# Patient Record
Sex: Female | Born: 1975 | Hispanic: No | Smoking: Current every day smoker
Health system: Southern US, Community
[De-identification: ages and names within clinical notes are randomized; demographics above are authoritative.]

## PROBLEM LIST (undated history)

## (undated) DIAGNOSIS — T8859XA Other complications of anesthesia, initial encounter: Secondary | ICD-10-CM

## (undated) DIAGNOSIS — F32A Depression, unspecified: Secondary | ICD-10-CM

## (undated) DIAGNOSIS — T4145XA Adverse effect of unspecified anesthetic, initial encounter: Secondary | ICD-10-CM

## (undated) DIAGNOSIS — R4586 Emotional lability: Secondary | ICD-10-CM

## (undated) DIAGNOSIS — J329 Chronic sinusitis, unspecified: Secondary | ICD-10-CM

## (undated) DIAGNOSIS — G629 Polyneuropathy, unspecified: Secondary | ICD-10-CM

## (undated) DIAGNOSIS — F419 Anxiety disorder, unspecified: Secondary | ICD-10-CM

## (undated) DIAGNOSIS — R531 Weakness: Secondary | ICD-10-CM

## (undated) DIAGNOSIS — F329 Major depressive disorder, single episode, unspecified: Secondary | ICD-10-CM

## (undated) DIAGNOSIS — E041 Nontoxic single thyroid nodule: Secondary | ICD-10-CM

## (undated) DIAGNOSIS — R5383 Other fatigue: Secondary | ICD-10-CM

## (undated) DIAGNOSIS — K219 Gastro-esophageal reflux disease without esophagitis: Secondary | ICD-10-CM

## (undated) HISTORY — PX: LAPAROSCOPY ABDOMEN DIAGNOSTIC: PRO50

## (undated) HISTORY — DX: Emotional lability: R45.86

## (undated) HISTORY — PX: TUBAL LIGATION: SHX77

## (undated) HISTORY — PX: EYE SURGERY: SHX253

## (undated) HISTORY — DX: Weakness: R53.1

## (undated) HISTORY — PX: APPENDECTOMY: SHX54

## (undated) HISTORY — DX: Chronic sinusitis, unspecified: J32.9

## (undated) HISTORY — PX: LASIK: SHX215

## (undated) HISTORY — PX: COLONOSCOPY: SHX174

## (undated) HISTORY — DX: Other fatigue: R53.83

## (undated) HISTORY — PX: WISDOM TOOTH EXTRACTION: SHX21

## (undated) HISTORY — DX: Polyneuropathy, unspecified: G62.9

---

## 1998-02-22 ENCOUNTER — Emergency Department (HOSPITAL_COMMUNITY): Admission: EM | Admit: 1998-02-22 | Discharge: 1998-02-22 | Payer: Self-pay | Admitting: Emergency Medicine

## 1998-04-02 ENCOUNTER — Emergency Department (HOSPITAL_COMMUNITY): Admission: EM | Admit: 1998-04-02 | Discharge: 1998-04-02 | Payer: Self-pay | Admitting: Endocrinology

## 1998-04-02 ENCOUNTER — Emergency Department (HOSPITAL_COMMUNITY): Admission: EM | Admit: 1998-04-02 | Discharge: 1998-04-02 | Payer: Self-pay | Admitting: Emergency Medicine

## 1998-07-22 ENCOUNTER — Ambulatory Visit (HOSPITAL_COMMUNITY): Admission: RE | Admit: 1998-07-22 | Discharge: 1998-07-22 | Payer: Self-pay

## 1998-08-19 ENCOUNTER — Inpatient Hospital Stay (HOSPITAL_COMMUNITY): Admission: AD | Admit: 1998-08-19 | Discharge: 1998-08-22 | Payer: Self-pay | Admitting: *Deleted

## 1998-08-26 ENCOUNTER — Encounter: Admission: RE | Admit: 1998-08-26 | Discharge: 1998-08-26 | Payer: Self-pay | Admitting: Obstetrics & Gynecology

## 1998-09-10 ENCOUNTER — Encounter: Admission: RE | Admit: 1998-09-10 | Discharge: 1998-09-10 | Payer: Self-pay | Admitting: Obstetrics

## 1998-10-02 ENCOUNTER — Inpatient Hospital Stay (HOSPITAL_COMMUNITY): Admission: RE | Admit: 1998-10-02 | Discharge: 1998-10-05 | Payer: Self-pay | Admitting: Obstetrics

## 1998-10-29 ENCOUNTER — Encounter: Admission: RE | Admit: 1998-10-29 | Discharge: 1998-10-29 | Payer: Self-pay | Admitting: Obstetrics

## 1998-11-05 ENCOUNTER — Encounter: Payer: Self-pay | Admitting: Obstetrics & Gynecology

## 1998-11-05 ENCOUNTER — Inpatient Hospital Stay (HOSPITAL_COMMUNITY): Admission: AD | Admit: 1998-11-05 | Discharge: 1998-11-05 | Payer: Self-pay | Admitting: Obstetrics & Gynecology

## 1998-11-05 ENCOUNTER — Encounter: Admission: RE | Admit: 1998-11-05 | Discharge: 1998-11-05 | Payer: Self-pay | Admitting: Obstetrics

## 1998-11-12 ENCOUNTER — Encounter: Admission: RE | Admit: 1998-11-12 | Discharge: 1998-11-12 | Payer: Self-pay | Admitting: Obstetrics

## 1998-11-19 ENCOUNTER — Encounter: Admission: RE | Admit: 1998-11-19 | Discharge: 1998-11-19 | Payer: Self-pay | Admitting: Obstetrics

## 1998-11-24 ENCOUNTER — Inpatient Hospital Stay (HOSPITAL_COMMUNITY): Admission: AD | Admit: 1998-11-24 | Discharge: 1998-11-24 | Payer: Self-pay | Admitting: *Deleted

## 1998-11-26 ENCOUNTER — Encounter (HOSPITAL_COMMUNITY): Admission: RE | Admit: 1998-11-26 | Discharge: 1998-12-07 | Payer: Self-pay | Admitting: Obstetrics

## 1998-11-26 ENCOUNTER — Encounter: Admission: RE | Admit: 1998-11-26 | Discharge: 1998-11-26 | Payer: Self-pay | Admitting: Obstetrics

## 1998-12-03 ENCOUNTER — Encounter: Admission: RE | Admit: 1998-12-03 | Discharge: 1998-12-03 | Payer: Self-pay | Admitting: Obstetrics

## 1998-12-06 ENCOUNTER — Inpatient Hospital Stay (HOSPITAL_COMMUNITY): Admission: AD | Admit: 1998-12-06 | Discharge: 1998-12-08 | Payer: Self-pay | Admitting: *Deleted

## 1999-10-27 ENCOUNTER — Emergency Department (HOSPITAL_COMMUNITY): Admission: EM | Admit: 1999-10-27 | Discharge: 1999-10-27 | Payer: Self-pay | Admitting: Emergency Medicine

## 2000-02-11 ENCOUNTER — Other Ambulatory Visit: Admission: RE | Admit: 2000-02-11 | Discharge: 2000-02-11 | Payer: Self-pay | Admitting: Obstetrics

## 2000-02-11 ENCOUNTER — Encounter (INDEPENDENT_AMBULATORY_CARE_PROVIDER_SITE_OTHER): Payer: Self-pay

## 2000-09-16 ENCOUNTER — Encounter: Payer: Self-pay | Admitting: *Deleted

## 2000-09-16 ENCOUNTER — Inpatient Hospital Stay (HOSPITAL_COMMUNITY): Admission: AD | Admit: 2000-09-16 | Discharge: 2000-09-16 | Payer: Self-pay | Admitting: *Deleted

## 2001-03-08 ENCOUNTER — Emergency Department (HOSPITAL_COMMUNITY): Admission: EM | Admit: 2001-03-08 | Discharge: 2001-03-08 | Payer: Self-pay | Admitting: Emergency Medicine

## 2005-02-22 ENCOUNTER — Other Ambulatory Visit: Admission: RE | Admit: 2005-02-22 | Discharge: 2005-02-22 | Payer: Self-pay | Admitting: Obstetrics and Gynecology

## 2005-02-26 ENCOUNTER — Inpatient Hospital Stay (HOSPITAL_COMMUNITY): Admission: AD | Admit: 2005-02-26 | Discharge: 2005-02-26 | Payer: Self-pay | Admitting: Obstetrics and Gynecology

## 2005-05-22 ENCOUNTER — Inpatient Hospital Stay (HOSPITAL_COMMUNITY): Admission: AD | Admit: 2005-05-22 | Discharge: 2005-05-22 | Payer: Self-pay | Admitting: Obstetrics and Gynecology

## 2005-06-28 ENCOUNTER — Inpatient Hospital Stay (HOSPITAL_COMMUNITY): Admission: AD | Admit: 2005-06-28 | Discharge: 2005-06-28 | Payer: Self-pay | Admitting: Obstetrics and Gynecology

## 2005-08-13 ENCOUNTER — Inpatient Hospital Stay (HOSPITAL_COMMUNITY): Admission: AD | Admit: 2005-08-13 | Discharge: 2005-08-13 | Payer: Self-pay | Admitting: Obstetrics and Gynecology

## 2005-08-16 ENCOUNTER — Inpatient Hospital Stay (HOSPITAL_COMMUNITY): Admission: AD | Admit: 2005-08-16 | Discharge: 2005-08-19 | Payer: Self-pay | Admitting: Obstetrics and Gynecology

## 2005-08-18 ENCOUNTER — Encounter (INDEPENDENT_AMBULATORY_CARE_PROVIDER_SITE_OTHER): Payer: Self-pay | Admitting: *Deleted

## 2006-09-01 ENCOUNTER — Ambulatory Visit (HOSPITAL_COMMUNITY): Admission: RE | Admit: 2006-09-01 | Discharge: 2006-09-01 | Payer: Self-pay | Admitting: Obstetrics and Gynecology

## 2008-07-24 ENCOUNTER — Emergency Department (HOSPITAL_COMMUNITY): Admission: EM | Admit: 2008-07-24 | Discharge: 2008-07-24 | Payer: Self-pay | Admitting: Emergency Medicine

## 2009-02-12 ENCOUNTER — Encounter: Admission: RE | Admit: 2009-02-12 | Discharge: 2009-02-12 | Payer: Self-pay | Admitting: Family Medicine

## 2010-12-20 ENCOUNTER — Inpatient Hospital Stay (INDEPENDENT_AMBULATORY_CARE_PROVIDER_SITE_OTHER)
Admission: RE | Admit: 2010-12-20 | Discharge: 2010-12-20 | Disposition: A | Discharge status: Home / Self Care | Payer: BC Managed Care – PPO | Source: Ambulatory Visit | Attending: Family Medicine | Admitting: Family Medicine

## 2010-12-20 DIAGNOSIS — R51 Headache: Secondary | ICD-10-CM

## 2010-12-20 DIAGNOSIS — R198 Other specified symptoms and signs involving the digestive system and abdomen: Secondary | ICD-10-CM

## 2010-12-20 DIAGNOSIS — F329 Major depressive disorder, single episode, unspecified: Secondary | ICD-10-CM

## 2011-03-11 NOTE — Op Note (Signed)
NAME:  Kelly Dorsey, Kelly Dorsey             ACCOUNT NO.:  [id number]   MEDICAL RECORD NO.:  [id number]          PATIENT TYPE:  INP   LOCATION:  9104                          FACILITY:  [facility]   PHYSICIAN:  Huel Cote, M.D. DATE OF BIRTH:  1976-02-08   DATE OF PROCEDURE:  08/18/2005  DATE OF DISCHARGE:                                 OPERATIVE REPORT   PREOPERATIVE DIAGNOSES:  1.  Desires sterility.  2.  Status post normal spontaneous vaginal delivery.   POSTOPERATIVE DIAGNOSES:  1.  Desires sterility.  2.  Status post normal spontaneous vaginal delivery.   PROCEDURE:  Postpartum tubal ligation.   SURGEON:  Dr. Huel Cote.   ANESTHESIA:  Spinal and local block.   FINDINGS:  Normal tubes, uterus, and ovaries noted.   ESTIMATED BLOOD LOSS:  Minimal.   PATHOLOGY:  Segments of right and left tube were sent.   PROCEDURE:  The patient was taken to the operating room where spinal  anesthesia was obtained without difficulty.  She was then prepped and draped  in the normal sterile fashion in the dorsal supine position.  A local block  was instilled in the patient's infraumbilical area with approximately 14 mL  of 1% plain lidocaine.  An incision was then made approximately 3 cm in  length with the scalpel and carried through to the underlying layer of  fascia by careful dissection sharply.  Fascia was then opened and the  peritoneal cavity itself entered bluntly.  With the peritoneal cavity  opened, [employer]Navy retractors were placed within the incision, and the  patient's left fallopian tube was identified and traced out to its  fimbriated end.  It was grasped with a Babcock clamp and approximately 2-3  cm segment tied off with 2 free ties of 0 plain.  This was then amputated  and sent off to pathology.  The free ends of the tube in the pedicle were  then further cauterized with Bovie cautery, and the tube was then returned  to the abdomen appearing hemostatic.  Attention was then  turned to the  patient's right which in a similar fashion, the fallopian tube was  identified and traced out to its fimbriated end and grasped with Babcock  clamp and elevated through the incision.  The tube was then tied off in a 2-  3 cm buckle of tube with 2 free ties of 0 plain.  The tubal segment itself  was then completely amputated and handed off to pathology.  The free ends of  the pedicle were cauterized with Bovie cautery and the tube returned to the  abdomen.  All instruments and sponges were ensured to be out of the  abdomen, and the fascia was then closed with 0 Vicryl in a running fashion.  Skin was closed with 3-0 Vicryl in a subcuticular stitch.  Sponge, lap, and  needle counts were again correct x2, and the patient was taken to recovery  room in stable condition.      Huel Cote, M.D.  Electronically Signed     KR/MEDQ  D:  08/18/2005  T:  08/18/2005  Job:  [id number] 

## 2011-03-11 NOTE — Discharge Summary (Signed)
Kelly Dorsey, Kelly Dorsey             ACCOUNT NO.:  [id number]   MEDICAL RECORD NO.:  [id number]          PATIENT TYPE:  INP   LOCATION:  9104                          [facility]   PHYSICIAN:  Huel Cote, M.D. DATE OF BIRTH:  Jan 29, 1976   DATE OF ADMISSION:  08/16/2005  DATE OF DISCHARGE:  08/19/2005                                 DISCHARGE SUMMARY   DISCHARGE DIAGNOSES:  1.  Term pregnancy at 37+ weeks, delivered.  2.  Status post vacuum-assisted vaginal delivery.  3.  Status post postpartum tubal ligation.   DISCHARGE FOLLOWUP:  The patient is to follow up in the office in  approximately two weeks for an incision check as needed and 6 weeks for her  postpartum exam.   DISCHARGE MEDICATIONS:  1.  Motrin 600 mg p.o. every six hours.  2.  Percocet 1-2 tablets p.o. every four hours p.r.n.   HOSPITAL COURSE:  The patient is a 35 year old, G5, P 2-0-2-2, who was  admitted at 37+ weeks' gestation with irregular contractions every 1-5  minutes and a cervical exam of 90%, 3-4, and a 0 station.  The patient did  live approximately 30 minutes away from the hospital and though she did not  appear to be in very rapid active labor, it was felt more prudent for her to  stay given the distance of her home from the hospital.  Prenatal care been complicated by history of anxiety and depression which  was stable off medications.  She also had increasing back pain which was  requiring Darvocet and Flexeril.  She requested a postpartum tubal ligation.  She was treated for preterm contractions without significant cervical change  since approximately 30 weeks.   PRENATAL LABORATORY:  As follows:  B+, antibody negative.  RPR nonreactive.  Rubella immune.  Hepatitis B surface antigen negative.  HIV negative.  GC  negative.  Chlamydia negative.  Group B strep negative.  One-hour Glucola  normal.   PAST OBSTETRICAL HISTORY:  1.  In 1996, she had a vaginal delivery of a 7 pound 11 ounces  infant.  2.  In 2000, she had a vaginal delivery of 7 pound 5 ounces infant.  3.  She had one elective abortion .  4.  One spontaneous miscarriage.   PAST GYNECOLOGIC HISTORY:  History of cryosurgery, Paps normal since that  point.   PAST MEDICAL HISTORY:  1.  Migraines.  2.  She did have some anxiety and depression.   PAST SURGICAL HISTORY:  Appendectomy.   ALLERGIES:  None.   MEDICATIONS:  Protonix and Darvocet.   PHYSICAL EXAMINATION:  VITAL SIGNS:  She was afebrile with stable vital  signs.  Contractions were every 1-5 minutes.  PELVIC:  As stated her cervix was 90%, 3+, and a 0 station.   She was admitted and underwent rupture of membranes, once she reached  approximately 4-cm, and had clear fluid noted. Fetal heart rate remained  reactive throughout this time.  She then began to progress more quickly,  received an epidural anesthesia. Unfortunately, she became very nauseated  from her epidural and despite anesthesiologist's efforts  to change the  medications and placate this, she was not any better and therefore,  requested that her epidural be turned off.  This was done and she eventually  reached complete dilation.  __________  ROP presentation.  She pushed for  about an hour to an hour and 15 minutes and then stated that she could no  longer push because of her exhaustion.  She requested help or a cesarean  section.  We discussed at that time the pluses and minuses in proceeding  with assisted vaginal delivery and she agreed to trial of vacuum.  The baby  was ROP at a +2 station to +3 station and the vacuum was applied.  With two  pulls, the baby was pulled down to an outlet presentation.  There were two  pop offs but these were more secondary to excessive gel on the head rather  than any significant traction.  The patient then with considerable coaching  pushed the vertex out to about the forehead and requested help with the  remainder.  The vacuum was applied one  more time and with gentle traction  the remainder of the vertex delivered easily.  Shoulders delivered without  problem.  The infant was suctioned  and the cord clamped.  Placenta  delivered spontaneously.  There was a vigorous female infant.  Apgar's were  8 and 9, weight was 8 pounds 6 ounces.  She had a small first-degree  laceration which was repaired with 2-0 Vicryl.  Cervix and rectum were  intact.  She was then admitted for routine postpartum care and her tubal  ligation was scheduled on the following day.  Her postpartum hemoglobin was 8.9.  She underwent a tubal ligation and had  no complications surrounding that.  And on post-op day #2, she was felt  stable for discharge home with her medications and follow-up as previously  stated.      Huel Cote, M.D.  Electronically Signed     KR/MEDQ  D:  08/19/2005  T:  08/19/2005  Job:  [id number]

## 2011-03-11 NOTE — Op Note (Signed)
NAME:  Kelly Dorsey, Kelly Dorsey             ACCOUNT NO.:  [id number]   MEDICAL RECORD NO.:  [id number]          PATIENT TYPE:  AMB   LOCATION:  SDC                           [facility]:  [facility]   PHYSICIAN:  Zenaida Niece, M.D.DATE OF BIRTH:  [birth year]   DATE OF PROCEDURE:  09/01/2006  DATE OF DISCHARGE:                                 OPERATIVE REPORT   PREOPERATIVE DIAGNOSIS:  Pelvic pain.   POSTOPERATIVE DIAGNOSIS:  Pelvic pain.   PROCEDURE:  Open diagnostic laparoscopy.   SURGEON:  Zenaida Niece, M.D.   ANESTHESIA:  General endotracheal tube.   SPECIMENS:  None.   ESTIMATED BLOOD LOSS:  Minimal.   COMPLICATIONS:  None.   FINDINGS:  She had a normal pelvis, normal uterus, tubes and ovaries with  evidence of prior tubal ligation.  There was a small amount of scar tissue  of the bowel to the right lower abdominal wall.  The upper abdomen appeared  normal.  She had no evidence of endometriosis or adhesions.   PROCEDURE IN DETAIL:  The patient was taken to the operating room and placed  in the dorsal supine position.  General anesthesia was induced and she was  placed in mobile stirrups.  The abdomen was then prepped and draped in the  usual sterile fashion, her bladder drained with a red rubber catheter, a  Hulka tenaculum applied to the cervix for uterine manipulation.  Infraumbilical skin was infiltrated with 0.25% Marcaine and a 3 cm  horizontal incision was made in her prior scar.  Blunt dissection was used  to identify the fascia which was grasped and elevated.  This was incised  sharply.  The peritoneum was then identified, elevated and entered sharply.  Army[employer]Nav[employer] retractors were used to retract and a finger was swept around and  confirmed no significant adhesions around the site of the incision.  A  pursestring suture of 0 Vicryl was placed around the fascia and the Hassan  cannula was inserted and secured.  CO2 gas was then insufflated and the  laparoscope was  inserted.  Inspection revealed the above mentioned findings.  A 5-mm port was placed on the left side to aid in moving structures.  There  did appear to be redundant sigmoid colon, but otherwise she had no source  for her pelvic pain identified.  She had a tubal ligation approximately a  year ago supposedly with plain gut suture.  The plain gut suture was still  visible around her tubes which do have evidence of prior tubal ligation.  Again, no source for her pelvic pain was identified.  The 5 mm port was  removed under direct visualization.  All the gas was allowed to deflate from  the abdomen as the [facility] cannula was removed.  The pursestring suture was  tied and this confirmed closure of the fascia.  The skin incisions were  closed with interrupted  subcuticular sutures of 4-0 Vicryl followed by Dermabond.  The Hulka  tenaculum was then removed from the cervix.  The patient tolerated the  procedure well and was taken to the recovery room in stable condition.  Counts were correct x2 and she had PAS hose on throughout the procedure.      Zenaida Niece, M.D.  Electronically Signed     TDM/MEDQ  D:  09/01/2006  T:  09/01/2006  Job:  [id number]

## 2011-07-14 ENCOUNTER — Emergency Department (HOSPITAL_COMMUNITY)
Admission: EM | Admit: 2011-07-14 | Discharge: 2011-07-14 | Disposition: A | Discharge status: Home / Self Care | Payer: Medicaid Other | Attending: Emergency Medicine | Admitting: Emergency Medicine

## 2011-07-14 ENCOUNTER — Emergency Department (HOSPITAL_COMMUNITY): Payer: Medicaid Other

## 2011-07-14 DIAGNOSIS — R109 Unspecified abdominal pain: Secondary | ICD-10-CM | POA: Insufficient documentation

## 2011-07-14 DIAGNOSIS — R197 Diarrhea, unspecified: Secondary | ICD-10-CM | POA: Insufficient documentation

## 2011-07-14 DIAGNOSIS — R112 Nausea with vomiting, unspecified: Secondary | ICD-10-CM | POA: Insufficient documentation

## 2011-07-14 LAB — CBC
HCT: 43.9 % (ref 36.0–46.0)
MCHC: 34.4 g/dL (ref 30.0–36.0)
MCV: 88.5 fL (ref 78.0–100.0)
RDW: 12.7 % (ref 11.5–15.5)

## 2011-07-14 LAB — COMPREHENSIVE METABOLIC PANEL
ALT: 16 U/L (ref 0–35)
Albumin: 4 g/dL (ref 3.5–5.2)
Alkaline Phosphatase: 100 U/L (ref 39–117)
Chloride: 101 mEq/L (ref 96–112)
GFR calc Af Amer: >60 mL/min (ref >60)
Glucose, Bld: 104 mg/dL — ABNORMAL HIGH (ref 70–99)
Potassium: 4 mEq/L (ref 3.5–5.1)
Sodium: 137 mEq/L (ref 135–145)
Total Bilirubin: 0.1 mg/dL — ABNORMAL LOW (ref 0.3–1.2)
Total Protein: 7.8 g/dL (ref 6.0–8.3)

## 2011-07-14 LAB — DIFFERENTIAL
Eosinophils Relative: 1 % (ref 0–5)
Lymphocytes Relative: 22 % (ref 12–46)
Lymphs Abs: 2.3 10*3/uL (ref 0.7–4.0)
Monocytes Absolute: 0.7 10*3/uL (ref 0.1–1.0)

## 2011-07-14 LAB — URINALYSIS, ROUTINE W REFLEX MICROSCOPIC
Bilirubin Urine: NEGATIVE
Glucose, UA: NEGATIVE mg/dL
Hgb urine dipstick: NEGATIVE
Ketones, ur: NEGATIVE mg/dL
Leukocytes, UA: NEGATIVE
Nitrite: NEGATIVE
Protein, ur: NEGATIVE mg/dL
Specific Gravity, Urine: 1.018 (ref 1.005–1.030)
Urobilinogen, UA: 0.2 mg/dL (ref 0.0–1.0)
pH: 7.5 (ref 5.0–8.0)

## 2011-07-14 LAB — POCT PREGNANCY, URINE: Preg Test, Ur: NEGATIVE

## 2011-07-15 LAB — GC/CHLAMYDIA PROBE AMP, GENITAL: Chlamydia, DNA Probe: NEGATIVE

## 2011-09-23 ENCOUNTER — Other Ambulatory Visit: Payer: Self-pay | Admitting: Gastroenterology

## 2012-02-02 ENCOUNTER — Encounter (HOSPITAL_COMMUNITY): Payer: Self-pay | Admitting: *Deleted

## 2012-02-02 ENCOUNTER — Emergency Department (HOSPITAL_COMMUNITY)
Admission: EM | Admit: 2012-02-02 | Discharge: 2012-02-02 | Disposition: A | Discharge status: Home / Self Care | Payer: Medicaid Other | Attending: Emergency Medicine | Admitting: Emergency Medicine

## 2012-02-02 DIAGNOSIS — F17200 Nicotine dependence, unspecified, uncomplicated: Secondary | ICD-10-CM | POA: Insufficient documentation

## 2012-02-02 DIAGNOSIS — F411 Generalized anxiety disorder: Secondary | ICD-10-CM | POA: Insufficient documentation

## 2012-02-02 DIAGNOSIS — F419 Anxiety disorder, unspecified: Secondary | ICD-10-CM

## 2012-02-02 HISTORY — DX: Anxiety disorder, unspecified: F41.9

## 2012-02-02 HISTORY — DX: Gastro-esophageal reflux disease without esophagitis: K21.9

## 2012-02-02 HISTORY — DX: Major depressive disorder, single episode, unspecified: F32.9

## 2012-02-02 HISTORY — DX: Depression, unspecified: F32.A

## 2012-02-02 MED ORDER — ACETAMINOPHEN 325 MG PO TABS
650.0000 mg | ORAL_TABLET | Freq: Once | ORAL | Status: AC
  Administered 2012-02-02: 650 mg via ORAL
  Filled 2012-02-02: qty 2

## 2012-02-02 MED ORDER — LORAZEPAM 1 MG PO TABS
1.0000 mg | ORAL_TABLET | Freq: Once | ORAL | Status: AC
  Administered 2012-02-02: 1 mg via ORAL
  Filled 2012-02-02: qty 1
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
## 2012-02-02 NOTE — Discharge Instructions (Signed)
RESOURCE GUIDE  Dental Problems  Patients with Medicaid: [facility]                     [facility] [address]                                           [address] Phone:  [phone]                                                  Phone:  [phone]  If unable to pay or uninsured, contact:  [facility] or [facility] to become qualified for the adult dental clinic.  Chronic Pain Problems Contact [facility]  [phone] Patients need to be referred by their primary care doctor.  Insufficient Money for Medicine Contact [employer]:  call "211" or [facility] 271-5999.  No Primary Care Doctor Call [facility]  832-8000 Other agencies that provide inexpensive medical care    [facility]  [phone]    [facility]  [phone]    [facility]  [phone]    [facility] Clinic  [phone]    [facility]  [phone]    [facility]  [phone]  Psychological Services [facility]  [phone] [facility]  [phone] [facility]   800 853-5163 (emergency services 641-4993)  Substance Abuse Resources [facility]  336-882-2125 [facility] 336-784-9470 [facility] 336-285-9073 [facility] 336-845-3988 Residential & Outpatient Substance Abuse Program  800-659-3381  Abuse/Neglect [place] Child Abuse Hotline (336) 641-3795 [place] Child Abuse Hotline 800-378-5315 (After Hours)  Emergency Shelter [facility] (336) 271-5985  [facility] (336) 275-9566 [facility] (704) 372-4663  MRSA Hotline #:   832-7006    [place] [facility]  [facility] [place]     [employer]                          [facility] [address] [place]                       [address]      [address]  [place]                                                [place]                            [place] Phone:  [phone]                                   Phone:  [phone]                 Phone:  [phone]  [facility] Phone:  [phone]    [place] Child Abuse Hotline (336) 342-1394 (336) 342-3537 (After Hours)  Anxiety and Panic Attacks Your caregiver has informed you that you are having an anxiety or panic attack. There may be many forms of this. Most of the time these attacks come suddenly and without warning. They come at any time of day, including periods of sleep, and at any time of life. They may be strong and unexplained. Although panic attacks are very scary, they are physically harmless. Sometimes the cause of your anxiety is not known. Anxiety is a protective mechanism of the body in its fight or flight mechanism. Most of these perceived danger situations are actually nonphysical situations (such as anxiety over losing a job). CAUSES  The causes of an anxiety or panic attack are many. Panic attacks may occur in otherwise healthy people given a certain set of circumstances. There may be a genetic cause for panic attacks. Some medications may also have anxiety as a side effect. SYMPTOMS  Some of the most common feelings are:  Intense terror.   Dizziness, feeling faint.   Hot and cold flashes.   Fear of going crazy.   Feelings that nothing is real.   Sweating.   Shaking.   Chest pain or a fast heartbeat (palpitations).   Smothering, choking sensations.   Feelings of impending doom and that death is near.   Tingling of extremities, this may be from over-breathing.   Altered reality (derealization).   Being detached from yourself (depersonalization).  Several symptoms can be present to make up anxiety or panic attacks. DIAGNOSIS  The evaluation by your caregiver will depend on the type of symptoms you are  experiencing. The diagnosis of anxiety or panic attack is made when no physical illness can be determined to be a cause of the symptoms. TREATMENT  Treatment to prevent anxiety and panic attacks may include:  Avoidance of circumstances that cause anxiety.   Reassurance and relaxation.   Regular exercise.   Relaxation therapies, such as yoga.   Psychotherapy with a psychiatrist or therapist.   Avoidance of caffeine, alcohol and illegal drugs.   Prescribed medication.  SEEK IMMEDIATE MEDICAL CARE IF:   You experience panic attack symptoms that are different than your usual symptoms.   You have any worsening or concerning symptoms.  Document Released: 10/10/2005 Document Revised: 09/29/2011 Document Reviewed: 02/11/2010 [employer] Patient Information 2012 [employer]. 

## 2012-02-02 NOTE — ED Provider Notes (Signed)
History     CSN: [id number]  Arrival date & time 02/02/12  1913   First MD Initiated Contact with Patient 02/02/12 1940      Chief Complaint  Patient presents with  . Anxiety  . Stress     Patient is a 36 y.o. female presenting with anxiety. The history is provided by the patient.  Anxiety This is a recurrent problem. The current episode started 6 to 12 hours ago. The problem occurs constantly. The problem has been gradually worsening. The symptoms are aggravated by stress. The symptoms are relieved by nothing. Treatments tried: xanax. The treatment provided no relief.  Pt here for anxiety She got into an argument today with her son and she became tearful and anxious She denies SI, and she denies any previous suicide attempt She reports long h/o anxiety She feels safe at home and is not threatened by family No cp/abd pain/weakness reported She reports HA similar to prior headaches  Past Medical History  Diagnosis Date  . Migraine   . GERD (gastroesophageal reflux disease)   . Depression   . Anxiety     Past Surgical History  Procedure Date  . Tubal ligation   . Appendectomy     History reviewed. No pertinent family history.  History  Substance Use Topics  . Smoking status: Current Everyday Smoker -- 0.5 packs/day  . Smokeless tobacco: Not on file  . Alcohol Use: Yes     rarely    OB History    Grav Para Term Preterm Abortions TAB SAB Ect Mult Living                  Review of Systems  Constitutional: Negative for fever.  Gastrointestinal: Negative for vomiting.    Allergies  Review of patient's allergies indicates no known allergies.  Home Medications   Current Outpatient Rx  Name Route Sig Dispense Refill  . ALPRAZOLAM 1 MG PO TABS Oral Take 1 mg by mouth 3 (three) times daily as needed. For anxiety.    . OMEPRAZOLE 20 MG PO CPDR Oral Take 20 mg by mouth daily.    Marland Kitchen RIZATRIPTAN BENZOATE 10 MG PO TABS Oral Take 10 mg by mouth as needed. For  migraines. May repeat in 2 hours if needed      BP 139/87  Pulse 103  Temp(Src) 98.4 F (36.9 C) (Oral)  Resp 20  SpO2 96%  LMP 01/23/2012  Physical Exam CONSTITUTIONAL: Well developed/well nourished HEAD AND FACE: Normocephalic/atraumatic EYES: EOMI/PERRL ENMT: Mucous membranes moist NECK: supple no meningeal signs SPINE:entire spine nontender CV: S1/S2 noted, no murmurs/rubs/gallops noted LUNGS: Lungs are clear to auscultation bilaterally, no apparent distress ABDOMEN: soft, nontender, no rebound or guarding GU:no cva tenderness NEURO: Pt is awake/alert, moves all extremitiesx4 EXTREMITIES: pulses normal, full ROM SKIN: warm, color normal PSYCH: anxious/tearful, but answers questions appropriately  ED Course  Procedures     1. Anxiety       MDM  Nursing notes reviewed and considered in documentation  Pt denies SI, seems improved after observation in ED Seen by ACT, given outpatient referral She is safe for d/c, and she repeatedly denies SI and she has no thoughts of harming herself or anyone else  The patient appears reasonably screened and/or stabilized for discharge and I doubt any other medical condition or other [facility] requiring further screening, evaluation, or treatment in the ED at this time prior to discharge.         Joya Gaskins, MD  02/02/12 2208 

## 2012-02-02 NOTE — ED Notes (Signed)
States she is currently not suicidal, but wanted to seek help because her family members have been encouraging to seek help.

## 2012-02-02 NOTE — ED Notes (Signed)
ACT at bedside.  Plans are for discharge home.

## 2013-01-06 ENCOUNTER — Encounter (HOSPITAL_COMMUNITY): Payer: Self-pay | Admitting: Family Medicine

## 2013-01-06 ENCOUNTER — Emergency Department (HOSPITAL_COMMUNITY): Payer: Medicaid Other

## 2013-01-06 ENCOUNTER — Emergency Department (HOSPITAL_COMMUNITY)
Admission: EM | Admit: 2013-01-06 | Discharge: 2013-01-06 | Disposition: A | Discharge status: Home / Self Care | Payer: Medicaid Other | Attending: Emergency Medicine | Admitting: Emergency Medicine

## 2013-01-06 DIAGNOSIS — K219 Gastro-esophageal reflux disease without esophagitis: Secondary | ICD-10-CM | POA: Insufficient documentation

## 2013-01-06 DIAGNOSIS — X500XXA Overexertion from strenuous movement or load, initial encounter: Secondary | ICD-10-CM | POA: Insufficient documentation

## 2013-01-06 DIAGNOSIS — R209 Unspecified disturbances of skin sensation: Secondary | ICD-10-CM | POA: Insufficient documentation

## 2013-01-06 DIAGNOSIS — F411 Generalized anxiety disorder: Secondary | ICD-10-CM | POA: Insufficient documentation

## 2013-01-06 DIAGNOSIS — Z79899 Other long term (current) drug therapy: Secondary | ICD-10-CM | POA: Insufficient documentation

## 2013-01-06 DIAGNOSIS — G43909 Migraine, unspecified, not intractable, without status migrainosus: Secondary | ICD-10-CM | POA: Insufficient documentation

## 2013-01-06 DIAGNOSIS — F3289 Other specified depressive episodes: Secondary | ICD-10-CM | POA: Insufficient documentation

## 2013-01-06 DIAGNOSIS — S93409A Sprain of unspecified ligament of unspecified ankle, initial encounter: Secondary | ICD-10-CM | POA: Insufficient documentation

## 2013-01-06 DIAGNOSIS — Y929 Unspecified place or not applicable: Secondary | ICD-10-CM | POA: Insufficient documentation

## 2013-01-06 DIAGNOSIS — F17200 Nicotine dependence, unspecified, uncomplicated: Secondary | ICD-10-CM | POA: Insufficient documentation

## 2013-01-06 DIAGNOSIS — Y9389 Activity, other specified: Secondary | ICD-10-CM | POA: Insufficient documentation

## 2013-01-06 MED ORDER — ACETAMINOPHEN 325 MG PO TABS
650.0000 mg | ORAL_TABLET | Freq: Once | ORAL | Status: AC
  Administered 2013-01-06: 650 mg via ORAL
  Filled 2013-01-06: qty 2

## 2013-01-06 NOTE — ED Notes (Signed)
Per pt sts her and her son were playing around and her right foot bend backwards and now pain ans swelling.

## 2013-01-06 NOTE — ED Notes (Signed)
Pt c/o pain in top of right foot and burning type pain to arch of foot.

## 2013-01-06 NOTE — Progress Notes (Signed)
Orthopedic Tech Progress Note Patient Details:  Kelly Dorsey 11-Nov-1975 [id number]  Ortho Devices Type of Ortho Device: Ace wrap;Crutches Ortho Device/Splint Location: right ankle Ortho Device/Splint Interventions: Application   Christinna Sprung 01/06/2013, 5:07 PM

## 2013-01-06 NOTE — ED Provider Notes (Signed)
History    This chart was scribed for non-physician practitioner working with Hurman Horn, MD by Melba Coon, ED Scribe. This patient was seen in room TR07C/TR07C and the patient's care was started at 4:33PM.   CSN: [id number]  Arrival date & time 01/06/13  1511   First MD Initiated Contact with Patient 01/06/13 1603      Chief Complaint  Patient presents with  . Foot Injury    (Consider location/radiation/quality/duration/timing/severity/associated sxs/prior treatment) The history is provided by the patient. No language interpreter was used.   Kelly Dorsey is a 37 y.o. female who presents to the Emergency Department complaining of constant, moderate to severe right foot/ankle pain and swelling with a sudden onset 2 hours ago secondary to an injury. She reports that she was playing with her son when she accidentally rolled and twisted her foot. She rates the severity of the pain 6-7/10 here at the ED but she reports the pain was 10/10 at onset. She reports the pain is radiating up to her lower leg with a burning sensation in the arch of foot. She also reports right 4th and 5th toe tingling. She denies any knee or hip pain. She has never injured the ankle before. No pain medications were taken at home. Ice did not alleviate the pain; she reports she could not tolerate the ice. Denies HA, fever, neck pain, sore throat, rash, back pain, CP, SOB, abdominal pain, nausea, emesis, diarrhea, dysuria, or extremity numbness. No known allergies. No other pertinent medical symptoms.  Past Medical History  Diagnosis Date  . Migraine   . GERD (gastroesophageal reflux disease)   . Depression   . Anxiety     Past Surgical History  Procedure Laterality Date  . Tubal ligation    . Appendectomy      History reviewed. No pertinent family history.  History  Substance Use Topics  . Smoking status: Current Every Day Smoker -- 0.50 packs/day  . Smokeless tobacco: Not on file  . Alcohol Use:  Yes     Comment: rarely    OB History   Grav Para Term Preterm Abortions TAB SAB Ect Mult Living                  Review of Systems 10 Systems reviewed and all are negative for acute change except as noted in the HPI.   Allergies  Review of patient's allergies indicates no known allergies.  Home Medications   Current Outpatient Rx  Name  Route  Sig  Dispense  Refill  . ALPRAZolam (XANAX) 1 MG tablet   Oral   Take 1 mg by mouth 3 (three) times daily as needed for sleep. For anxiety.         Marland Kitchen ibuprofen (ADVIL,MOTRIN) 200 MG tablet   Oral   Take 400 mg by mouth every 6 (six) hours as needed for pain (for lasix surgery.).         Marland Kitchen omeprazole (PRILOSEC) 20 MG capsule   Oral   Take 20 mg by mouth daily.         . rizatriptan (MAXALT) 10 MG tablet   Oral   Take 10 mg by mouth as needed. For migraines. May repeat in 2 hours if needed           BP 125/84  Pulse 99  Temp(Src) 97.4 F (36.3 C)  Resp 18  SpO2 98%  LMP 01/03/2013  Physical Exam  Nursing note and vitals reviewed. Constitutional: She  is oriented to person, place, and time. She appears well-developed and well-nourished. No distress.  HENT:  Head: Normocephalic and atraumatic.  Eyes: EOM are normal.  Neck: Neck supple. No tracheal deviation present.  Cardiovascular: Normal rate.   Pulmonary/Chest: Effort normal and breath sounds normal. No respiratory distress. She has no wheezes. She has no rales.  Musculoskeletal: Normal range of motion. She exhibits edema and tenderness.       Right ankle: She exhibits ecchymosis. Tenderness.  Right ankle tenderness with limited ROM secondary to pain. Distal pulses intact. Neg anterior drawer test and talar tilt.   Neurological: She is alert and oriented to person, place, and time.  Skin: Skin is warm and dry.  Psychiatric: She has a normal mood and affect. Her behavior is normal.    ED Course  Procedures (including critical care time)  DIAGNOSTIC  STUDIES: Oxygen Saturation is 98% on room air, normal by my interpretation.    COORDINATION OF CARE:  4:40PM - imaging results reviewed and are unremarkable. Splint/ACE wrap will be ordered for [employer]. She is advised to continue intermittent ice with leg elevation at home; she is also advised she can take ibuprofen at home. She is ready for d/c.   Labs Reviewed - No data to display Dg Foot Complete Right  01/06/2013  *RADIOLOGY REPORT*  Clinical Data: Right foot injury. Lateral metatarsal pain and bruising.  RIGHT FOOT COMPLETE - 3+ VIEW  Comparison:  None.  Findings:  There is no evidence of fracture or dislocation.  There is no evidence of arthropathy or other focal bone abnormality. Soft tissues are unremarkable.  IMPRESSION: Negative.   Original Report Authenticated By: Myles Rosenthal, M.D.      1. Ankle sprain, right, initial encounter       MDM  Patient is a 37 yo F presenting to ED with acute onset right ankle pain. Fracture and dislocation were r/o by radiology. ATF and CFL injury were unlikely given negative talar tilt and anterior drawer exams. Patient was diagnosed with lateral ankle sprain given negative radiology report and injury MOA. Patient was sent home with crutches and follow up with PCP advised.   I personally performed the services described in this documentation, which was scribed in my presence. The recorded information has been reviewed and is accurate.    Jeannetta Ellis, PA-C 01/07/13 0012

## 2013-01-06 NOTE — Progress Notes (Signed)
Orthopedic Tech Progress Note Patient Details:  Kelly Dorsey May 10, 1976 [id number]  Patient ID: [name], female   DOB: 04/30/1976, 37 y.o.   MRN: [id number] Viewed order from doctor's order list  Nikki Dom 01/06/2013, 5:07 PM

## 2013-01-07 NOTE — ED Provider Notes (Signed)
Medical screening examination/treatment/procedure(s) were performed by non-physician practitioner and as supervising physician I was immediately available for consultation/collaboration.  Hurman Horn, MD 01/07/13 [phone]

## 2015-07-09 ENCOUNTER — Encounter: Payer: Self-pay | Admitting: *Deleted

## 2015-07-09 ENCOUNTER — Other Ambulatory Visit: Payer: Medicaid Other

## 2015-07-09 MED ORDER — SODIUM CHLORIDE 0.9 % IJ SOLN
INTRAMUSCULAR | Status: AC
  Filled 2015-07-09: qty 3

## 2015-07-09 NOTE — Patient Instructions (Signed)
  Your procedure is scheduled on: 07-17-15 Report to [place] To find out your arrival time please call [phone] between 1PM - 3PM on 07-16-15.  Remember: Instructions that are not followed completely may result in serious medical risk, up to and including death, or upon the discretion of your surgeon and anesthesiologist your surgery may need to be rescheduled.    _X___ 1. Do not eat food or drink liquids after midnight. No gum chewing or hard candies.     _X___ 2. No Alcohol for 24 hours before or after surgery.   ____ 3. Bring all medications with you on the day of surgery if instructed.    ____ 4. Notify your doctor if there is any change in your medical condition     (cold, fever, infections).     Do not wear jewelry, make-up, hairpins, clips or nail polish.  Do not wear lotions, powders, or perfumes. You may wear deodorant.  Do not shave 48 hours prior to surgery. Men may shave face and neck.  Do not bring valuables to the hospital.    [facility] is not responsible for any belongings or valuables.               Contacts, dentures or bridgework may not be worn into surgery.  Leave your suitcase in the car. After surgery it may be brought to your room.  For patients admitted to the hospital, discharge time is determined by your  treatment team.   Patients discharged the day of surgery will not be allowed to drive home.   Please read over the following fact sheets that you were given:      _X___ Take these medicines the morning of surgery with A SIP OF WATER:    1. OMPERAZOLE  2. TAKE AN OMEPRAZOLE ON Thursday NIGHT  3.   4.  5.  6.  ____ Fleet Enema (as directed)   ____ Use CHG Soap as directed  ____ Use inhalers on the day of surgery  ____ Stop metformin 2 days prior to surgery    ____ Take 1/2 of usual insulin dose the night before surgery and none on the morning of surgery.   ____ Stop Coumadin/Plavix/aspirin-N/A   _X___  Stop Anti-inflammatories-STOP IBUPROFEN NOW-NO NSAIDS OR ASA PRODUCTS-PERCOCET OK   ____ Stop supplements until after surgery.    ____ Bring C-Pap to the hospital.

## 2015-07-17 ENCOUNTER — Encounter
Admission: RE | Disposition: A | Discharge status: Home / Self Care | Payer: Self-pay | Source: Ambulatory Visit | Attending: Obstetrics and Gynecology

## 2015-07-17 ENCOUNTER — Ambulatory Visit
Admission: RE | Admit: 2015-07-17 | Discharge: 2015-07-17 | Disposition: A | Discharge status: Home / Self Care | Payer: Medicaid Other | Source: Ambulatory Visit | Attending: Obstetrics and Gynecology | Admitting: Obstetrics and Gynecology

## 2015-07-17 ENCOUNTER — Encounter: Payer: Self-pay | Admitting: *Deleted

## 2015-07-17 ENCOUNTER — Ambulatory Visit: Payer: Medicaid Other | Admitting: Certified Registered Nurse Anesthetist

## 2015-07-17 DIAGNOSIS — F419 Anxiety disorder, unspecified: Secondary | ICD-10-CM | POA: Diagnosis not present

## 2015-07-17 DIAGNOSIS — Z79899 Other long term (current) drug therapy: Secondary | ICD-10-CM | POA: Diagnosis not present

## 2015-07-17 DIAGNOSIS — N840 Polyp of corpus uteri: Secondary | ICD-10-CM | POA: Diagnosis not present

## 2015-07-17 DIAGNOSIS — N946 Dysmenorrhea, unspecified: Secondary | ICD-10-CM | POA: Insufficient documentation

## 2015-07-17 DIAGNOSIS — K219 Gastro-esophageal reflux disease without esophagitis: Secondary | ICD-10-CM | POA: Insufficient documentation

## 2015-07-17 DIAGNOSIS — F17200 Nicotine dependence, unspecified, uncomplicated: Secondary | ICD-10-CM | POA: Diagnosis not present

## 2015-07-17 DIAGNOSIS — G43909 Migraine, unspecified, not intractable, without status migrainosus: Secondary | ICD-10-CM | POA: Insufficient documentation

## 2015-07-17 DIAGNOSIS — Z800 Family history of malignant neoplasm of digestive organs: Secondary | ICD-10-CM | POA: Diagnosis not present

## 2015-07-17 DIAGNOSIS — N920 Excessive and frequent menstruation with regular cycle: Secondary | ICD-10-CM | POA: Diagnosis not present

## 2015-07-17 DIAGNOSIS — Z9049 Acquired absence of other specified parts of digestive tract: Secondary | ICD-10-CM | POA: Diagnosis not present

## 2015-07-17 DIAGNOSIS — Z803 Family history of malignant neoplasm of breast: Secondary | ICD-10-CM | POA: Insufficient documentation

## 2015-07-17 DIAGNOSIS — Z808 Family history of malignant neoplasm of other organs or systems: Secondary | ICD-10-CM | POA: Diagnosis not present

## 2015-07-17 HISTORY — DX: Adverse effect of unspecified anesthetic, initial encounter: T41.45XA

## 2015-07-17 HISTORY — PX: DILITATION & CURRETTAGE/HYSTROSCOPY WITH NOVASURE ABLATION: SHX5568

## 2015-07-17 HISTORY — DX: Nontoxic single thyroid nodule: E04.1

## 2015-07-17 HISTORY — DX: Other complications of anesthesia, initial encounter: T88.59XA

## 2015-07-17 LAB — POCT PREGNANCY, URINE: Preg Test, Ur: NEGATIVE

## 2015-07-17 LAB — TYPE AND SCREEN
ABO/RH(D): B POS
Antibody Screen: NEGATIVE

## 2015-07-17 LAB — ABO/RH: ABO/RH(D): B POS

## 2015-07-17 SURGERY — DILATATION & CURETTAGE/HYSTEROSCOPY WITH NOVASURE ABLATION
Anesthesia: General

## 2015-07-17 MED ORDER — HYDROMORPHONE HCL 1 MG/ML IJ SOLN
INTRAMUSCULAR | Status: AC
  Administered 2015-07-17: 0.5 mg via INTRAVENOUS
  Filled 2015-07-17: qty 1

## 2015-07-17 MED ORDER — DEXAMETHASONE SODIUM PHOSPHATE 4 MG/ML IJ SOLN
INTRAMUSCULAR | Status: DC | PRN
  Administered 2015-07-17: 8 mg via INTRAVENOUS

## 2015-07-17 MED ORDER — IBUPROFEN 200 MG PO TABS: 200.0000 mg | ORAL_TABLET | Freq: Four times a day (QID) | ORAL | Status: DC | PRN

## 2015-07-17 MED ORDER — FENTANYL CITRATE (PF) 100 MCG/2ML IJ SOLN
INTRAMUSCULAR | Status: AC
  Administered 2015-07-17: 25 ug via INTRAVENOUS
  Filled 2015-07-17: qty 2

## 2015-07-17 MED ORDER — FENTANYL CITRATE (PF) 100 MCG/2ML IJ SOLN
INTRAMUSCULAR | Status: DC | PRN
  Administered 2015-07-17 (×4): 25 ug via INTRAVENOUS

## 2015-07-17 MED ORDER — LIDOCAINE HCL (CARDIAC) 20 MG/ML IV SOLN
INTRAVENOUS | Status: DC | PRN
  Administered 2015-07-17: 80 mg via INTRAVENOUS

## 2015-07-17 MED ORDER — ONDANSETRON HCL 4 MG/2ML IJ SOLN
INTRAMUSCULAR | Status: DC | PRN
  Administered 2015-07-17: 4 mg via INTRAVENOUS

## 2015-07-17 MED ORDER — ONDANSETRON HCL 4 MG/2ML IJ SOLN
4.0000 mg | Freq: Once | INTRAMUSCULAR | Status: DC | PRN
Start: 2015-07-17 — End: 2015-07-17

## 2015-07-17 MED ORDER — PROPOFOL 10 MG/ML IV BOLUS
INTRAVENOUS | Status: DC | PRN
  Administered 2015-07-17: 180 mg via INTRAVENOUS

## 2015-07-17 MED ORDER — LIDOCAINE HCL (PF) 1 % IJ SOLN
INTRAMUSCULAR | Status: AC
  Filled 2015-07-17: qty 30

## 2015-07-17 MED ORDER — KETOROLAC TROMETHAMINE 30 MG/ML IJ SOLN
INTRAMUSCULAR | Status: DC | PRN
  Administered 2015-07-17: 30 mg via INTRAVENOUS

## 2015-07-17 MED ORDER — FENTANYL CITRATE (PF) 100 MCG/2ML IJ SOLN
25.0000 ug | INTRAMUSCULAR | Status: DC | PRN
  Administered 2015-07-17 (×4): 25 ug via INTRAVENOUS

## 2015-07-17 MED ORDER — GLYCOPYRROLATE 0.2 MG/ML IJ SOLN
INTRAMUSCULAR | Status: DC | PRN
  Administered 2015-07-17: 0.1 mg via INTRAVENOUS
  Administered 2015-07-17: 0.2 mg via INTRAVENOUS

## 2015-07-17 MED ORDER — OXYCODONE-ACETAMINOPHEN 5-325 MG PO TABS: 2.0000 | ORAL_TABLET | Freq: Four times a day (QID) | ORAL | Status: DC | PRN

## 2015-07-17 MED ORDER — LACTATED RINGERS IV SOLN
INTRAVENOUS | Status: DC
  Administered 2015-07-17: 12:00:00 via INTRAVENOUS

## 2015-07-17 MED ORDER — MIDAZOLAM HCL 2 MG/2ML IJ SOLN
INTRAMUSCULAR | Status: DC | PRN
  Administered 2015-07-17: 2 mg via INTRAVENOUS

## 2015-07-17 MED ORDER — HYDROMORPHONE HCL 1 MG/ML IJ SOLN
0.5000 mg | INTRAMUSCULAR | Status: AC | PRN
  Administered 2015-07-17 (×4): 0.5 mg via INTRAVENOUS

## 2015-07-17 MED ORDER — DOCUSATE SODIUM 100 MG PO CAPS: 100.0000 mg | ORAL_CAPSULE | Freq: Two times a day (BID) | ORAL | Status: DC

## 2015-07-17 SURGICAL SUPPLY — 24 items
CANISTER SUCT 3000ML (MISCELLANEOUS) ×3 IMPLANT
CATH ROBINSON RED A/P 16FR (CATHETERS) ×3 IMPLANT
GLOVE BIO SURGEON STRL SZ7 (GLOVE) ×5 IMPLANT
GLOVE INDICATOR 7.5 STRL GRN (GLOVE) ×5 IMPLANT
GOWN STRL REUS W/ TWL LRG LVL3 (GOWN DISPOSABLE) ×1 IMPLANT
GOWN STRL REUS W/ TWL XL LVL3 (GOWN DISPOSABLE) ×1 IMPLANT
GOWN STRL REUS W/TWL LRG LVL3 (GOWN DISPOSABLE) ×3
GOWN STRL REUS W/TWL XL LVL3 (GOWN DISPOSABLE) ×3
IV LACTATED RINGERS 1000ML (IV SOLUTION) ×3 IMPLANT
KIT RM TURNOVER CYSTO AR (KITS) ×3 IMPLANT
NDL SPNL 22GX3.5 QUINCKE BK (NEEDLE) ×1 IMPLANT
NDL SPNL 22GX5 LNG QUINC BK (NEEDLE) ×1 IMPLANT
NEEDLE SPNL 22GX3.5 QUINCKE BK (NEEDLE) ×3 IMPLANT
NEEDLE SPNL 22GX5 LNG QUINC BK (NEEDLE) ×3 IMPLANT
NOVASURE ENDOMETRIAL ABLATION (MISCELLANEOUS) IMPLANT
NS IRRIG 500ML POUR BTL (IV SOLUTION) ×3 IMPLANT
PACK DNC HYST (MISCELLANEOUS) ×3 IMPLANT
PAD OB MATERNITY 4.3X12.25 (PERSONAL CARE ITEMS) ×3 IMPLANT
PAD PREP 24X41 OB/GYN DISP (PERSONAL CARE ITEMS) ×3 IMPLANT
SEAL ROD LENS SCOPE MYOSURE (ABLATOR) ×3 IMPLANT
SYR CONTROL 10ML (SYRINGE) ×3 IMPLANT
TOWEL OR 17X26 4PK STRL BLUE (TOWEL DISPOSABLE) ×3 IMPLANT
TUBING CONNECTING 10 (TUBING) ×2 IMPLANT
TUBING CONNECTING 10' (TUBING) ×1

## 2015-07-17 NOTE — Anesthesia Preprocedure Evaluation (Signed)
Anesthesia Evaluation  Patient identified by MRN, date of birth, ID band Patient awake    Reviewed: Allergy & Precautions, NPO status , Patient's Chart, lab work & pertinent test results  History of Anesthesia Complications (+) history of anesthetic complications (N/V with epidural)  Airway Mallampati: III  TM Distance: >3 FB Neck ROM: Full    Dental  (+) Teeth Intact   Pulmonary Current Smoker (1 ppd),           Cardiovascular negative cardio ROS       Neuro/Psych Anxiety Depression    GI/Hepatic negative GI ROS, Neg liver ROS, GERD  Medicated and Controlled,  Endo/Other  Thyroid nodule   Renal/GU negative Renal ROS     Musculoskeletal   Abdominal   Peds  Hematology negative hematology ROS (+)   Anesthesia Other Findings   Reproductive/Obstetrics                             Anesthesia Physical Anesthesia Plan  ASA: II  Anesthesia Plan: General   Post-op Pain Management:    Induction: Intravenous  Airway Management Planned: LMA  Additional Equipment:   Intra-op Plan:   Post-operative Plan:   Informed Consent: I have reviewed the patients History and Physical, chart, labs and discussed the procedure including the risks, benefits and alternatives for the proposed anesthesia with the patient or authorized representative who has indicated his/her understanding and acceptance.     Plan Discussed with:   Anesthesia Plan Comments:         Anesthesia Quick Evaluation

## 2015-07-17 NOTE — Op Note (Signed)
Operative Note   07/17/2015  PRE-OP DIAGNOSIS: Menorrhagia. Dysmenorrhea. Endometrial polyp   POST-OP DIAGNOSIS: Same   SURGEON: Surgeon(s) and Role:    * Aletha Halim, MD - Primary  ASSISTANT: None  PROCEDURE: Procedure(s): DILATATION & CURETTAGE/HYSTEROSCOPY WITH NOVASURE ABLATION   ANESTHESIA: General  ESTIMATED BLOOD LOSS: 1mL  DRAINS: I/O cath 93mL   TOTAL IV FLUIDS: 779mL crystalloid  SPECIMENS: endometrial curettings  VTE PROPHYLAXIS: SCDs to the bilateral lower extremities  ANTIBIOTICS: not indicated  FLUID DEFICIT: 66AY  COMPLICATIONS: None  DISPOSITION: PACU - hemodynamically stable.  CONDITION: stable  FINDINGS: Exam under anesthesia revealed small, mobile RV uterus with no masses and bilateral adnexa without masses or fullness. Hysteroscopy revealed a grossly normal appearing uterine cavity with endometrial polyp seen posterior, mid fundal polyp with bilateral tubal ostia and normal appearing endocervical canal. After the D&C and then ablation, polyp was no longer seen and uniform ablation noted with some slight pink at the fundal area of the uterus.  PROCEDURE IN DETAIL:  After informed consent was obtained, the patient was taken to the operating room where anesthesia was obtained without difficulty. The patient was positioned in the dorsal lithotomy position in [place].  The patient's bladder was catheterized with an in and out foley catheter.  The patient was examined under anesthesia, with the above noted findings.  The bi-valved speculum was placed inside the patient's vagina, and the the anterior lip of the cervix was seen and grasped with the tenaculum.  The cervical length was 4cm and the uterine cavity sounded to 8cm for a cavity length of 4cm.  Next, the hysteroscope easily passed with the above noted findings.  The Novasure device was then deployed and used in the standard fashion: cavity width 3.8cm, power 84, time 23m 55s. The device was then  removed and the hysteroscope re-introduced with the above noted findings.  The hystersocope was removed and excellent hemostasis was noted, and all instruments were removed, with excellent hemostasis noted throughout.  She was then taken out of dorsal lithotomy. The patient tolerated the procedure well.  Instrument counts were correct.  The patient was taken to recovery room in excellent condition.  Durene Romans MD [facility] Pager [phone]

## 2015-07-17 NOTE — H&P (Signed)
GYN H&P No changes from office H&P, all questions asked and answered; will proceed with hysteroscopy, D&C, novasure endometrial ablation when OR is ready.  Durene Romans MD [facility]  Pager: [phone]

## 2015-07-17 NOTE — Anesthesia Postprocedure Evaluation (Signed)
  Anesthesia Post-op Note  Patient: Kelly Dorsey  Procedure(s) Performed: Procedure(s): DILATATION & CURETTAGE/HYSTEROSCOPY WITH NOVASURE ABLATION (N/A)  Anesthesia type:General  Patient location: PACU  Post pain: Pain level controlled  Post assessment: Post-op Vital signs reviewed, Patient's Cardiovascular Status Stable, Respiratory Function Stable, Patent Airway and No signs of Nausea or vomiting  Post vital signs: Reviewed and stable  Last Vitals:  Filed Vitals:   07/17/15 1300  BP: 93/63  Pulse: 66  Temp: 36 C  Resp: 16    Level of consciousness: awake, alert  and patient cooperative  Complications: No apparent anesthesia complications

## 2015-07-17 NOTE — Discharge Instructions (Signed)
° ° °  We will discuss your surgery once again in detail at your post-op visit in two to four weeks. If you havent already done so, please call to make your appointment as soon as possible.  [facility]  [address] [place], [place] [address] [place], [place] [address]  Phone: [phone] Phone: [phone]  Fax: [phone] Fax: [phone]   Hysteroscopy, endometrial ablation after care These instructions give you information on caring for yourself after your procedure. Your doctor may also give you more specific instructions. Call your doctor if you have any problems or questions after your procedure. HOME CARE  Do not drive for 24 hours.  Wait 1 week before doing any activities that wear you out.  Do not stand for a long time.  Limit stair climbing to once or twice a day.  Rest often.  Continue with your usual diet.  Drink enough fluids to keep your pee (urine) clear or pale yellow.  If you have a hard time pooping (constipation), you may:  Take a medicine to help you go poop (laxative) as told by your doctor.  Eat more fruit and bran.  Drink more fluids.  Take showers, not baths, for as long as told by your doctor.  Do not swim or use a hot tub until your doctor says it is okay.  Have someone with you for 1day after the procedure.  Do not douche, use tampons, or have sex (intercourse) until seen by your doctor  Only take medicines as told by your doctor. Do not take aspirin. It can cause bleeding.  Keep all doctor visits. GET HELP IF:  You have cramps or pain not helped by medicine.  You have new pain in the belly (abdomen).  You have a bad smelling fluid coming from your vagina.  You have a rash.  You have problems with any medicine. GET HELP RIGHT AWAY IF:   You start to bleed more than a regular period.  You have a fever.  You have chest pain.  You have trouble breathing.  You feel dizzy or feel like passing  out (fainting).  You pass out.  You have pain in the tops of your shoulders.  You have vaginal bleeding with or without clumps of blood (blood clots). MAKE SURE YOU:  Understand these instructions.  Will watch your condition.  Will get help right away if you are not doing well or get worse. Document Released: 07/19/2008 Document Revised: 10/15/2013 Document Reviewed: 05/09/2013 [facility] Patient Information 2015 [place], [place]. This information is not intended to replace advice given to you by your health care provider. Make sure you discuss any questions you have with your health care provider.

## 2015-07-17 NOTE — Transfer of Care (Signed)
Immediate Anesthesia Transfer of Care Note  Patient: Kelly Dorsey  Procedure(s) Performed: Procedure(s): DILATATION & CURETTAGE/HYSTEROSCOPY WITH NOVASURE ABLATION (N/A)  Patient Location: PACU  Anesthesia Type:General  Level of Consciousness: sedated  Airway & Oxygen Therapy: Patient Spontanous Breathing and Patient connected to face mask oxygen  Post-op Assessment: Report given to RN and Post -op Vital signs reviewed and stable  Post vital signs: Reviewed and stable  Last Vitals:  Filed Vitals:   07/17/15 1300  BP: 93/63  Pulse: 66  Temp: 36 C  Resp: 16    Complications: No apparent anesthesia complications

## 2015-07-20 LAB — SURGICAL PATHOLOGY

## 2015-07-29 ENCOUNTER — Ambulatory Visit
Admission: RE | Admit: 2015-07-29 | Discharge: 2015-07-29 | Disposition: A | Discharge status: Home / Self Care | Payer: Medicaid Other | Source: Ambulatory Visit | Attending: Obstetrics & Gynecology | Admitting: Obstetrics & Gynecology

## 2015-07-29 ENCOUNTER — Other Ambulatory Visit: Payer: Self-pay | Admitting: Obstetrics & Gynecology

## 2015-07-29 DIAGNOSIS — R1030 Lower abdominal pain, unspecified: Secondary | ICD-10-CM

## 2015-07-29 DIAGNOSIS — R110 Nausea: Secondary | ICD-10-CM

## 2015-07-29 MED ORDER — IOHEXOL 300 MG/ML  SOLN
100.0000 mL | Freq: Once | INTRAMUSCULAR | Status: AC | PRN
  Administered 2015-07-29: 100 mL via INTRAVENOUS

## 2015-08-19 ENCOUNTER — Observation Stay
Admission: EM | Admit: 2015-08-19 | Discharge: 2015-08-21 | Disposition: A | Discharge status: Home / Self Care | Payer: Medicaid Other | Attending: Obstetrics & Gynecology | Admitting: Obstetrics & Gynecology

## 2015-08-19 DIAGNOSIS — F17200 Nicotine dependence, unspecified, uncomplicated: Secondary | ICD-10-CM | POA: Insufficient documentation

## 2015-08-19 DIAGNOSIS — G43909 Migraine, unspecified, not intractable, without status migrainosus: Secondary | ICD-10-CM | POA: Insufficient documentation

## 2015-08-19 DIAGNOSIS — R509 Fever, unspecified: Secondary | ICD-10-CM | POA: Diagnosis not present

## 2015-08-19 DIAGNOSIS — Z23 Encounter for immunization: Secondary | ICD-10-CM | POA: Insufficient documentation

## 2015-08-19 DIAGNOSIS — K219 Gastro-esophageal reflux disease without esophagitis: Secondary | ICD-10-CM | POA: Diagnosis not present

## 2015-08-19 DIAGNOSIS — R102 Pelvic and perineal pain: Secondary | ICD-10-CM | POA: Insufficient documentation

## 2015-08-19 DIAGNOSIS — Z79899 Other long term (current) drug therapy: Secondary | ICD-10-CM | POA: Diagnosis not present

## 2015-08-19 DIAGNOSIS — F329 Major depressive disorder, single episode, unspecified: Secondary | ICD-10-CM | POA: Insufficient documentation

## 2015-08-19 DIAGNOSIS — Z9889 Other specified postprocedural states: Secondary | ICD-10-CM | POA: Insufficient documentation

## 2015-08-19 DIAGNOSIS — Z791 Long term (current) use of non-steroidal anti-inflammatories (NSAID): Secondary | ICD-10-CM | POA: Diagnosis not present

## 2015-08-19 DIAGNOSIS — R1030 Lower abdominal pain, unspecified: Secondary | ICD-10-CM | POA: Insufficient documentation

## 2015-08-19 DIAGNOSIS — F419 Anxiety disorder, unspecified: Secondary | ICD-10-CM | POA: Diagnosis not present

## 2015-08-19 DIAGNOSIS — N946 Dysmenorrhea, unspecified: Secondary | ICD-10-CM | POA: Diagnosis present

## 2015-08-19 DIAGNOSIS — N719 Inflammatory disease of uterus, unspecified: Secondary | ICD-10-CM | POA: Diagnosis not present

## 2015-08-19 NOTE — ED Notes (Signed)
Patient presents for back pain, lower abdominal pain, vaginal pressure, "like I was going to give birth." Had uterine ablation in September with infection post op.  Tonight with fever, taking ibuprofen all day to combat symptoms.

## 2015-08-20 ENCOUNTER — Emergency Department: Payer: Medicaid Other

## 2015-08-20 DIAGNOSIS — N946 Dysmenorrhea, unspecified: Secondary | ICD-10-CM | POA: Diagnosis present

## 2015-08-20 LAB — URINALYSIS COMPLETE WITH MICROSCOPIC (ARMC ONLY)
BACTERIA UA: NONE SEEN
Bilirubin Urine: NEGATIVE
GLUCOSE, UA: 50 mg/dL — AB
Hgb urine dipstick: NEGATIVE
Ketones, ur: NEGATIVE mg/dL
Leukocytes, UA: NEGATIVE
Nitrite: NEGATIVE
Protein, ur: NEGATIVE mg/dL
Specific Gravity, Urine: 1.009 (ref 1.005–1.030)
pH: 7 (ref 5.0–8.0)

## 2015-08-20 LAB — CHLAMYDIA/NGC RT PCR (ARMC ONLY)
CHLAMYDIA TR: NOT DETECTED
N gonorrhoeae: NOT DETECTED

## 2015-08-20 LAB — COMPREHENSIVE METABOLIC PANEL WITH GFR
ALT: 12 U/L — ABNORMAL LOW (ref 14–54)
AST: 15 U/L (ref 15–41)
Albumin: 3.9 g/dL (ref 3.5–5.0)
Alkaline Phosphatase: 56 U/L (ref 38–126)
Anion gap: 5 (ref 5–15)
BUN: 8 mg/dL (ref 6–20)
CO2: 25 mmol/L (ref 22–32)
Calcium: 8.6 mg/dL — ABNORMAL LOW (ref 8.9–10.3)
Chloride: 106 mmol/L (ref 101–111)
Creatinine, Ser: 0.88 mg/dL (ref 0.44–1.00)
GFR calc Af Amer: >60 mL/min
GFR calc non Af Amer: >60 mL/min
Glucose, Bld: 106 mg/dL — ABNORMAL HIGH (ref 65–99)
Potassium: 3.5 mmol/L (ref 3.5–5.1)
Sodium: 136 mmol/L (ref 135–145)
Total Bilirubin: 0.8 mg/dL (ref 0.3–1.2)
Total Protein: 6.9 g/dL (ref 6.5–8.1)

## 2015-08-20 LAB — CBC WITH DIFFERENTIAL/PLATELET
Basophils Absolute: 0.2 10*3/uL — ABNORMAL HIGH (ref 0–0.1)
Basophils Relative: 1 %
Eosinophils Absolute: 0 10*3/uL (ref 0–0.7)
Eosinophils Relative: 0 %
HCT: 42 % (ref 35.0–47.0)
Hemoglobin: 13.8 g/dL (ref 12.0–16.0)
Lymphocytes Relative: 19 %
Lymphs Abs: 2.5 10*3/uL (ref 1.0–3.6)
MCH: 30.3 pg (ref 26.0–34.0)
MCHC: 32.8 g/dL (ref 32.0–36.0)
MCV: 92.3 fL (ref 80.0–100.0)
Monocytes Absolute: 0.9 10*3/uL (ref 0.2–0.9)
Monocytes Relative: 7 %
Neutro Abs: 9.5 10*3/uL — ABNORMAL HIGH (ref 1.4–6.5)
Neutrophils Relative %: 73 %
Platelets: 228 10*3/uL (ref 150–440)
RBC: 4.55 MIL/uL (ref 3.80–5.20)
RDW: 12.8 % (ref 11.5–14.5)
WBC: 13.1 10*3/uL — ABNORMAL HIGH (ref 3.6–11.0)

## 2015-08-20 LAB — LIPASE, BLOOD: Lipase: 28 U/L (ref 11–51)

## 2015-08-20 LAB — LACTIC ACID, PLASMA: Lactic Acid, Venous: 1.1 mmol/L (ref 0.5–2.0)

## 2015-08-20 MED ORDER — ACETAMINOPHEN 325 MG PO TABS
ORAL_TABLET | ORAL | Status: AC
  Administered 2015-08-20: 650 mg via ORAL
  Filled 2015-08-20: qty 2

## 2015-08-20 MED ORDER — CLINDAMYCIN PHOSPHATE 900 MG/50ML IV SOLN
900.0000 mg | Freq: Three times a day (TID) | INTRAVENOUS | Status: DC
  Filled 2015-08-20 (×2): qty 50

## 2015-08-20 MED ORDER — MORPHINE SULFATE (PF) 2 MG/ML IV SOLN
2.0000 mg | Freq: Once | INTRAVENOUS | Status: AC
  Administered 2015-08-20: 2 mg via INTRAVENOUS

## 2015-08-20 MED ORDER — ONDANSETRON HCL 4 MG/2ML IJ SOLN
4.0000 mg | Freq: Once | INTRAMUSCULAR | Status: AC
  Administered 2015-08-20: 4 mg via INTRAVENOUS

## 2015-08-20 MED ORDER — ONDANSETRON HCL 4 MG/2ML IJ SOLN
4.0000 mg | Freq: Four times a day (QID) | INTRAMUSCULAR | Status: DC | PRN
  Administered 2015-08-20: 4 mg via INTRAVENOUS
  Filled 2015-08-20: qty 2

## 2015-08-20 MED ORDER — CLINDAMYCIN PHOSPHATE 900 MG/50ML IV SOLN
900.0000 mg | Freq: Once | INTRAVENOUS | Status: AC
  Administered 2015-08-20: 900 mg via INTRAVENOUS
  Filled 2015-08-20: qty 50

## 2015-08-20 MED ORDER — HOME MED STORE IN PYXIS: 10.0000 | Freq: Two times a day (BID) | Status: DC | PRN

## 2015-08-20 MED ORDER — MORPHINE SULFATE (PF) 2 MG/ML IV SOLN
INTRAVENOUS | Status: AC
  Administered 2015-08-20: 2 mg via INTRAVENOUS
  Filled 2015-08-20: qty 1

## 2015-08-20 MED ORDER — PANTOPRAZOLE SODIUM 40 MG PO TBEC
40.0000 mg | DELAYED_RELEASE_TABLET | Freq: Every day | ORAL | Status: DC
  Administered 2015-08-20 – 2015-08-21 (×2): 40 mg via ORAL
  Filled 2015-08-20 (×2): qty 1

## 2015-08-20 MED ORDER — DIPHENHYDRAMINE HCL 25 MG PO CAPS
25.0000 mg | ORAL_CAPSULE | Freq: Every evening | ORAL | Status: DC | PRN
  Administered 2015-08-20: 25 mg via ORAL
  Filled 2015-08-20: qty 1

## 2015-08-20 MED ORDER — DOXYCYCLINE HYCLATE 100 MG PO TABS
100.0000 mg | ORAL_TABLET | Freq: Two times a day (BID) | ORAL | Status: DC
  Administered 2015-08-20 – 2015-08-21 (×3): 100 mg via ORAL
  Filled 2015-08-20 (×3): qty 1

## 2015-08-20 MED ORDER — ACETAMINOPHEN 325 MG PO TABS
650.0000 mg | ORAL_TABLET | Freq: Once | ORAL | Status: AC
  Administered 2015-08-20: 650 mg via ORAL

## 2015-08-20 MED ORDER — RIZATRIPTAN BENZOATE 10 MG PO TABS
10.0000 mg | ORAL_TABLET | Freq: Two times a day (BID) | ORAL | Status: DC | PRN
Start: 2015-08-20 — End: 2015-08-21
  Filled 2015-08-20 (×2): qty 1

## 2015-08-20 MED ORDER — ACETAMINOPHEN 325 MG PO TABS: 650.0000 mg | ORAL_TABLET | ORAL | Status: DC | PRN

## 2015-08-20 MED ORDER — INFLUENZA VAC SPLIT QUAD 0.5 ML IM SUSY
0.5000 mL | PREFILLED_SYRINGE | INTRAMUSCULAR | Status: AC
  Administered 2015-08-21: 0.5 mL via INTRAMUSCULAR
  Filled 2015-08-20: qty 0.5

## 2015-08-20 MED ORDER — KETOROLAC TROMETHAMINE 30 MG/ML IJ SOLN
30.0000 mg | Freq: Four times a day (QID) | INTRAMUSCULAR | Status: AC
  Administered 2015-08-20 – 2015-08-21 (×3): 30 mg via INTRAVENOUS
  Filled 2015-08-20 (×3): qty 1

## 2015-08-20 MED ORDER — CLONAZEPAM 0.5 MG PO TABS: 0.5000 mg | ORAL_TABLET | Freq: Every day | ORAL | Status: DC

## 2015-08-20 MED ORDER — OXYCODONE-ACETAMINOPHEN 5-325 MG PO TABS
1.0000 | ORAL_TABLET | ORAL | Status: DC | PRN
  Administered 2015-08-20 (×2): 2 via ORAL
  Filled 2015-08-20: qty 2

## 2015-08-20 MED ORDER — CLONAZEPAM 0.5 MG PO TABS: 0.5000 mg | ORAL_TABLET | Freq: Every evening | ORAL | Status: DC | PRN

## 2015-08-20 MED ORDER — AMPHETAMINE-DEXTROAMPHET ER 20 MG PO CP24: 20.0000 mg | ORAL_CAPSULE | Freq: Every day | ORAL | Status: DC

## 2015-08-20 MED ORDER — OXYCODONE-ACETAMINOPHEN 5-325 MG PO TABS
ORAL_TABLET | ORAL | Status: AC
  Filled 2015-08-20: qty 2

## 2015-08-20 MED ORDER — SODIUM CHLORIDE 0.9 % IV BOLUS (SEPSIS)
1000.0000 mL | Freq: Once | INTRAVENOUS | Status: AC
  Administered 2015-08-20: 1000 mL via INTRAVENOUS

## 2015-08-20 MED ORDER — ONDANSETRON HCL 4 MG/2ML IJ SOLN
INTRAMUSCULAR | Status: AC
  Administered 2015-08-20: 4 mg via INTRAVENOUS
  Filled 2015-08-20: qty 2

## 2015-08-20 MED ORDER — METRONIDAZOLE 500 MG PO TABS
500.0000 mg | ORAL_TABLET | Freq: Two times a day (BID) | ORAL | Status: DC
  Administered 2015-08-20 – 2015-08-21 (×4): 500 mg via ORAL
  Filled 2015-08-20 (×5): qty 1

## 2015-08-20 MED ORDER — GENTAMICIN SULFATE 40 MG/ML IJ SOLN
1.5000 mg/kg | Freq: Three times a day (TID) | INTRAVENOUS | Status: DC
  Filled 2015-08-20 (×2): qty 2.25

## 2015-08-20 MED ORDER — MORPHINE SULFATE (PF) 2 MG/ML IV SOLN: 1.0000 mg | INTRAVENOUS | Status: DC | PRN

## 2015-08-20 MED ORDER — ONDANSETRON HCL 4 MG PO TABS
4.0000 mg | ORAL_TABLET | Freq: Four times a day (QID) | ORAL | Status: DC | PRN
  Administered 2015-08-21: 4 mg via ORAL
  Filled 2015-08-20: qty 1

## 2015-08-20 MED ORDER — LACTATED RINGERS IV SOLN
INTRAVENOUS | Status: DC
  Administered 2015-08-20 (×2): via INTRAVENOUS

## 2015-08-20 MED ORDER — DEXTROSE 5 % IV SOLN
2.0000 g | Freq: Four times a day (QID) | INTRAVENOUS | Status: DC
  Administered 2015-08-20 – 2015-08-21 (×4): 2 g via INTRAVENOUS
  Filled 2015-08-20 (×8): qty 2

## 2015-08-20 NOTE — ED Notes (Signed)
Pt is tearful when returning from Ultrasound. MD notified of pelvic and head pain.

## 2015-08-20 NOTE — Progress Notes (Signed)
Pt reports improved abd pain from 10/10 on admission to 7/10 now.  Min d/c.    Does c/o migraine h/a, usually takes Maxalt, has own meds. AF, VSS    Abd soft, ND, mild T to palpation    Extr no edema or calf T A/P: Cont IV ABX for endometritis, need to see improved pain and no fever    Recheck CBC for WBC especially later today    Options of future D&C or hysterectomy if persistent sx's discussed; not needed on this admission    Monitor hemodynamics as was concern for low BP and tachycardia on admission from ER    Blood cultures pending as are cervical/uterine culture

## 2015-08-20 NOTE — Progress Notes (Signed)
Gynecology Progress Note  Admission Date: 08/19/2015 Current Date: 08/20/2015  Kelly Dorsey is a 39 y.o. HD#1 admitted for abdominal pain. PMHx significant for migraines, h/o BTL   ROS and patient/family/surgical history, located on admission H&P note dated 08/19/2015, have been reviewed, and there are no changes except as noted below Yesterday/Overnight Events:  After gent and clinda ordered, I ordered GC/CT urine and U/A and culture and changed abx to PID regimen.   Subjective:  Patient states that pain began earlier this week and since she saw me 1wk ago she's had sexual intercourse. She states that the pain is diffuse across her abdomen but worse in her lower belly. She states that she's not having any VB but some slight discharge that started after the abx. She states that her pain feels like her period and that this is the time of the month when it would be due but she thought that after the ablation that she wouldn't have this pain anymore. No fevers, chills, nausea, vomiting, dysuria but patient with migraine for the past few hours.   Objective:    Current Vital Signs 24h Vital Sign Ranges  T 98.7 F (37.1 C) Temp  Avg: 98.2 F (36.8 C)  Min: 97.8 F (36.6 C)  Max: 98.7 F (37.1 C)  BP 108/71 mmHg BP  Min: 82/68  Max: 137/115  HR 88 Pulse  Avg: 91.5  Min: 78  Max: 103  RR 18 Resp  Avg: 17.4  Min: 16  Max: 18  SaO2 100 % RA SpO2  Avg: 98.8 %  Min: 95 %  Max: 100 %       24 Hour I/O Current Shift I/O  Time Ins Outs   10/27 0701 - 10/27 1900 In: 240 [P.O.:240] Out: 1000 [Urine:1000]    Patient Vitals for the past 24 hrs:  BP Temp Temp src Pulse Resp SpO2 Height Weight  08/20/15 1734 108/71 mmHg 98.7 F (37.1 C) Oral 88 18 - - -  08/20/15 1207 118/78 mmHg 98 F (36.7 C) Oral 96 18 - - -  08/20/15 0902 104/61 mmHg 97.9 F (36.6 C) Oral 83 16 100 % - -  08/20/15 0538 102/62 mmHg 97.8 F (36.6 C) Oral 89 18 99 % - -  08/20/15 0430 (!) 100/58 mmHg - - 79 - 97 % - -   08/20/15 0400 116/80 mmHg - - 88 - 95 % - -  08/20/15 0330 (!) 136/115 mmHg - - 90 - 100 % - -  08/20/15 0253 110/70 mmHg - - 78 16 99 % - -  08/20/15 0230 110/70 mmHg - - 90 - 98 % - -  08/20/15 0200 108/79 mmHg - - 97 - 99 % - -  08/20/15 0130 (!) 137/115 mmHg - - 94 - 99 % - -  08/20/15 0030 121/81 mmHg - - 95 - 100 % - -  08/20/15 0000 115/79 mmHg - - (!) 102 18 100 % - -  08/20/15 0000 115/79 mmHg - - 100 - 100 % - -  08/19/15 2333 (!) 82/68 mmHg 98.4 F (36.9 C) Oral (!) 103 18 99 % 5\' 6"  (1.676 m) 134 lb (60.782 kg)    Physical exam: NAD +BS, soft, NTTP, ND, no peritoneal s/s RRR no MRGs CTAB No c/c/e  Medications Current Facility-Administered Medications  Medication Dose Route Frequency Provider Last Rate Last Dose  . acetaminophen (TYLENOL) tablet 650 mg  650 mg Oral Q4H PRN Gae Dry, MD      .  cefOXitin (MEFOXIN) 2 g in dextrose 5 % 50 mL IVPB  2 g Intravenous 4 times per day Aletha Halim, MD   2 g at 08/20/15 1335  . clonazePAM (KLONOPIN) tablet 0.5 mg  0.5 mg Oral QHS PRN Aletha Halim, MD      . doxycycline (VIBRA-TABS) tablet 100 mg  100 mg Oral Q12H Aletha Halim, MD   100 mg at 08/20/15 1014  . [START ON 08/21/2015] Influenza vac split quadrivalent PF (FLUARIX) injection 0.5 mL  0.5 mL Intramuscular Tomorrow-1000 Gae Dry, MD      . lactated ringers infusion   Intravenous Continuous Gae Dry, MD 125 mL/hr at 08/20/15 1730    . metroNIDAZOLE (FLAGYL) tablet 500 mg  500 mg Oral BID WC Aletha Halim, MD   500 mg at 08/20/15 1708  . morphine 2 MG/ML injection 1-2 mg  1-2 mg Intravenous Q3H PRN Gae Dry, MD      . ondansetron [facility]) tablet 4 mg  4 mg Oral Q6H PRN Gae Dry, MD       Or  . ondansetron [facility]) injection 4 mg  4 mg Intravenous Q6H PRN Gae Dry, MD   4 mg at 08/20/15 0835  . oxyCODONE-acetaminophen (PERCOCET/ROXICET) 5-325 MG per tablet 1-2 tablet  1-2 tablet Oral Q3H PRN Gae Dry, MD   2 tablet at  08/20/15 1023  . pantoprazole (PROTONIX) EC tablet 40 mg  40 mg Oral QAC breakfast Gae Dry, MD   40 mg at 08/20/15 0836  . rizatriptan (MAXALT) tablet 10 mg  10 mg Oral BID PRN Honor Loh Ward, MD        Recent Labs Lab 08/20/15 0005  WBC 13.1*  HGB 13.8  HCT 42.0  PLT 228     Recent Labs Lab 08/20/15 0005  NA 136  K 3.5  CL 106  CO2 25  BUN 8  CREATININE 0.88  CALCIUM 8.6*  PROT 6.9  BILITOT 0.8  ALKPHOS 56  ALT 12*  AST 15  GLUCOSE 106*   Negative: lipase, lactic acid, urine GC/CT, U/A  BCX x 2: NGTD UCx, Anaerobic GU culture: pending  Radiology TECHNIQUE: Both transabdominal and transvaginal ultrasound examinations of the pelvis were performed. Transabdominal technique was performed for global imaging of the pelvis including uterus, ovaries, adnexal regions, and pelvic cul-de-sac. It was necessary to proceed with endovaginal exam following the transabdominal exam to visualize the endometrium and ovaries.  COMPARISON: CT of the abdomen and pelvis 07/29/2015.  FINDINGS: Uterus  Measurements: 7.2 x 3.3 x 4.0 cm. No fibroids or other mass visualized.  Endometrium  Thickness: 11.2 mm. Small amount of fluid in the endometrial canal.  Right ovary  Measurements: 3.7 x 2.1 x 2.8 cm. Normal appearance/no adnexal mass. Multiple follicles, largest of which measures up to 2.5 cm in diameter.  Left ovary  Measurements: 3.2 x 1.9 x 2.0 cm. Normal appearance/no adnexal mass. Multiple small follicles.  Other findings  No free fluid.  IMPRESSION: 1. Small amount of fluid in the endometrial canal. Otherwise, normal examination.  Assessment & Plan:  Pt stable *GYN: d/w pt findings and that it's very unlikely for her to have an infection related to her h/s, novasure this far out especially b/c she was feeling and doing fine with negative exam at her post op check last week. I told her that she knows her body best and if it feels  exactly like her old periods and it's that time then  it's probably that and it explains the trace amount of fluid in the endometrial cavity which can also be normal too. Also d/w pt that WBC only minimally elevated and she's been afebrile here and rest of labs negative. Will continue iv abx x 24hrs and then transition to PO tomorrow and f/u AM CBC. D/w that not all patients get relief from novasure and may need to consider definitive surgery but this can be d/w later as an outpatient.  *Migraines: continue home meds (propranolol) and will add on toradol to help with this and cramps *Pain: see above and PO PRNs *FEN/GI: regular diet, can SLIV MIVF *PPx: OOB ad lib *Dispo: possibly late tomorrow  Code Status: Full Code  Durene Romans MD [facility] Pager: [phone]

## 2015-08-20 NOTE — H&P (Signed)
Obstetrics & Gynecology Consultation Note  Date of Consultation: 08/20/2015   Requesting Provider: [facility]  Primary OBGYN: Becky Augusta Primary Care Provider: Leonard Downing  Reason for Consultation: Lower abdominal pain  History of Present Illness: Ms. Topor is a 39 y.o. No obstetric history on file. (Patient's last menstrual period was 07/28/2015.), with the above CC. Pt had D&C with Endometrial Ablation 07/17/15 and was noted to have post op discharge significantly after the procedure; seen in office a number of times, taken oral ABX.  Now, she presents w 2 day h/o lower abd pain and reported fever today.  Felt malaise and weakness tonight along with the severe pains in abdomen.    ROS: A 12-point review of systems was performed and negative, except as stated in the above HPI.  OBGYN History: As per HPI. OB History    No data available     Past Medical History: Past Medical History  Diagnosis Date  . Migraine   . GERD (gastroesophageal reflux disease)   . Depression   . Anxiety   . Complication of anesthesia     AFTER BTL IN 2006, BP BECAME VERY ELEVATED-WITH EPIDURAL DURING DELIVERY, PT STARTED VOMITING AN SHAKING AND EPIDURAL HAD TO BE STOPPED  . Thyroid nodule     Past Surgical History: Past Surgical History  Procedure Laterality Date  . Tubal ligation    . Appendectomy    . Laparoscopy abdomen diagnostic    . Lasik    . Colonoscopy    . Dilitation & currettage/hystroscopy with novasure ablation N/A 07/17/2015    Procedure: DILATATION & CURETTAGE/HYSTEROSCOPY WITH NOVASURE ABLATION;  Surgeon: Aletha Halim, MD;  Location: [facility] ORS;  Service: Gynecology;  Laterality: N/A;    Family History:  No family history on file. She denies any female cancers, bleeding or blood clotting disorders.   Social History:  Social History   Social History  . Marital Status: Widowed    Spouse Name: N/A  . Number of Children: N/A  . Years of Education: N/A   Occupational  History  . Not on file.   Social History Main Topics  . Smoking status: Current Every Day Smoker -- 0.50 packs/day for 22 years    Types: Cigarettes  . Smokeless tobacco: Not on file  . Alcohol Use: Yes     Comment: rarely  . Drug Use: No  . Sexual Activity: Not on file   Other Topics Concern  . Not on file   Social History Narrative   Allergy: Allergies  Allergen Reactions  . Other     EPIDURAL WITH DELIVERY MADE PT VERY SICK-VOMITING AND UNCONTROLLABLE SHAKING    Current Outpatient Medications:  (Not in a hospital admission)   Hospital Medications: No current facility-administered medications for this encounter.   Current Outpatient Prescriptions  Medication Sig Dispense Refill  . amphetamine-dextroamphetamine (ADDERALL) 10 MG tablet Take 10 mg by mouth daily with breakfast.    . clonazePAM (KLONOPIN) 0.5 MG tablet Take 0.5 mg by mouth at bedtime.    . docusate sodium (COLACE) 100 MG capsule Take 1 capsule (100 mg total) by mouth 2 (two) times daily. (Patient not taking: Reported on 08/20/2015) 15 capsule 0  . ibuprofen (MOTRIN IB) 200 MG tablet Take 1 tablet (200 mg total) by mouth every 6 (six) hours as needed. (Patient taking differently: Take 200 mg by mouth every 6 (six) hours as needed for moderate pain. ) 45 tablet 1  . omeprazole (PRILOSEC) 20 MG capsule Take 20 mg  by mouth as needed.     Marland Kitchen oxyCODONE-acetaminophen (ROXICET) 5-325 MG per tablet Take 2 tablets by mouth every 6 (six) hours as needed for severe pain. (Patient not taking: Reported on 08/20/2015) 4 tablet 0  . propranolol ER (INDERAL LA) 120 MG 24 hr capsule Take 240 mg by mouth at bedtime.     . rizatriptan (MAXALT) 10 MG tablet Take 10 mg by mouth as needed. For migraines. May repeat in 2 hours if needed       Physical Exam: Filed Vitals:   08/19/15 2333 08/20/15 0000 08/20/15 0253  BP: 82/68 115/79 110/70  Pulse: 103 102 78  Temp: 98.4 F (36.9 C)    TempSrc: Oral    Resp: 18 18 16    Height: 5\' 6"  (1.676 m)    Weight: 134 lb (60.782 kg)    SpO2: 99% 100% 99%   Body mass index is 21.64 kg/(m^2). General appearance: Well nourished, well developed female in no acute distress.  Neck:  Supple, normal appearance, and no thyromegaly  Cardiovascular:Regular rate and rhythm.  No murmurs, rubs or gallops. Respiratory:  Clear to auscultation bilateral. Normal respiratory effort Abdomen: positive bowel sounds and no masses, hernias; diffusely  tender to palpation but esp over lower abd midline, non distended Neuro/Psych:  Normal mood and affect.  Skin:  Warm and dry.  Lymphatic:  No inguinal lymphadenopathy.   Pelvic exam: is not limited by body habitus EGBUS: within normal limits, Vagina: within normal limits and with no blood and scant d/c in the vault, Cervix:  Normal but CMT on exam, Uterus:  Tender but firm, normal size; and Adnexa:  No mass  Recent Labs Lab 08/20/15 0005  WBC 13.1*  HGB 13.8  HCT 42.0  PLT 228    Recent Labs Lab 08/20/15 0005  NA 136  K 3.5  CL 106  CO2 25  BUN 8  CREATININE 0.88  CALCIUM 8.6*  PROT 6.9  BILITOT 0.8  ALKPHOS 56  ALT 12*  AST 15  GLUCOSE 106*   No results for input(s): APTT, INR, PTT in the last 168 hours.  Invalid input(s): DRHAPTT No results for input(s): ABORH in the last 168 hours.  Imaging:  See [place] report  Assessment: Ms. Bin is a 39 y.o.(Patient's last menstrual period was 07/28/2015.) who presented to the ED with complaints of abdominal pain; findings are consistent with uterine pain either from Endometritis related to prior procedure or other uterine pain from prior menorrhagia now after an ablation procedure--- due to tachycardia and hypotension and small elevation in WBC will treat as endometritis in short term with IV ABX.  Plan: [facility]. IVF. Analgesia.  Barnett Applebaum, MD [facility] Pager [phone]

## 2015-08-20 NOTE — ED Provider Notes (Signed)
[facility] Emergency Department Provider Note  ____________________________________________  Time seen: 11:45 PM  I have reviewed the triage vital signs and the nursing notes.   HISTORY  Chief Complaint Back Pain; Abdominal Pain; and Vaginal Discharge      HPI Kelly Dorsey is a 39 y.o. female presents with history of low back pain pelvic pain and vaginal pressure described as "like I'm giving birth". Patient also admits to fever tonight for which she's been taking ibuprofen "all day". Patient also admits to vaginal discharge. Patient admits to having a uterine ablation performed in September which was calm K did by postoperative infection. She states that following antibiotic therapy she was seen by Dr. Ilda Basset who advised her that the infection was still present.     Past Medical History  Diagnosis Date  . Migraine   . GERD (gastroesophageal reflux disease)   . Depression   . Anxiety   . Complication of anesthesia     AFTER BTL IN 2006, BP BECAME VERY ELEVATED-WITH EPIDURAL DURING DELIVERY, PT STARTED VOMITING AN SHAKING AND EPIDURAL HAD TO BE STOPPED  . Thyroid nodule     There are no active problems to display for this patient.   Past Surgical History  Procedure Laterality Date  . Tubal ligation    . Appendectomy    . Laparoscopy abdomen diagnostic    . Lasik    . Colonoscopy    . Dilitation & currettage/hystroscopy with novasure ablation N/A 07/17/2015    Procedure: DILATATION & CURETTAGE/HYSTEROSCOPY WITH NOVASURE ABLATION;  Surgeon: Aletha Halim, MD;  Location: [facility] ORS;  Service: Gynecology;  Laterality: N/A;    Current Outpatient Rx  Name  Route  Sig  Dispense  Refill  . amphetamine-dextroamphetamine (ADDERALL) 10 MG tablet   Oral   Take 10 mg by mouth daily with breakfast.         . clonazePAM (KLONOPIN) 0.5 MG tablet   Oral   Take 0.5 mg by mouth at bedtime.         Marland Kitchen ibuprofen (MOTRIN IB) 200 MG tablet   Oral  Take 1 tablet (200 mg total) by mouth every 6 (six) hours as needed. Patient taking differently: Take 200 mg by mouth every 6 (six) hours as needed for moderate pain.    45 tablet   1   . omeprazole (PRILOSEC) 20 MG capsule   Oral   Take 20 mg by mouth as needed.          . propranolol ER (INDERAL LA) 120 MG 24 hr capsule   Oral   Take 240 mg by mouth at bedtime.          . rizatriptan (MAXALT) 10 MG tablet   Oral   Take 10 mg by mouth as needed. For migraines. May repeat in 2 hours if needed         . docusate sodium (COLACE) 100 MG capsule   Oral   Take 1 capsule (100 mg total) by mouth 2 (two) times daily. Patient not taking: Reported on 08/20/2015   15 capsule   0   . oxyCODONE-acetaminophen (ROXICET) 5-325 MG per tablet   Oral   Take 2 tablets by mouth every 6 (six) hours as needed for severe pain. Patient not taking: Reported on 08/20/2015   4 tablet   0     Allergies Other  No family history on file.  Social History Social History  Substance Use Topics  . Smoking status: Current  Every Day Smoker -- 0.50 packs/day for 22 years    Types: Cigarettes  . Smokeless tobacco: Not on file  . Alcohol Use: Yes     Comment: rarely    Review of Systems  Constitutional: Positive for fever. Eyes: Negative for visual changes. ENT: Negative for sore throat. Cardiovascular: Negative for chest pain. Respiratory: Negative for shortness of breath. Gastrointestinal: Negative for abdominal pain, vomiting and diarrhea. Positive for pelvic pain Genitourinary: Negative for dysuria. Musculoskeletal: Negative for back pain. Skin: Negative for rash. Neurological: Negative for headaches, focal weakness or numbness.   10-point ROS otherwise negative.  ____________________________________________   PHYSICAL EXAM:  VITAL SIGNS: ED Triage Vitals  Enc Vitals Group     BP 08/19/15 2333 82/68 mmHg     Pulse Rate 08/19/15 2333 103     Resp 08/19/15 2333 18     Temp  08/19/15 2333 98.4 F (36.9 C)     Temp Source 08/19/15 2333 Oral     SpO2 08/19/15 2333 99 %     Weight 08/19/15 2333 134 lb (60.782 kg)     Height 08/19/15 2333 5\' 6"  (1.676 m)     Head Cir --      Peak Flow --      Pain Score 08/19/15 2334 10     Pain Loc --      Pain Edu? --      Excl. in [place]? --      Constitutional: Alert and oriented. Well appearing and in no distress. Eyes: Conjunctivae are normal. PERRL. Normal extraocular movements. ENT   Head: Normocephalic and atraumatic.   Nose: No congestion/rhinnorhea.   Mouth/Throat: Mucous membranes are moist.   Neck: No stridor. Hematological/Lymphatic/Immunilogical: No cervical lymphadenopathy. Cardiovascular: Normal rate, regular rhythm. Normal and symmetric distal pulses are present in all extremities. No murmurs, rubs, or gallops. Respiratory: Normal respiratory effort without tachypnea nor retractions. Breath sounds are clear and equal bilaterally. No wheezes/rales/rhonchi. Gastrointestinal: Soft and nontender. No distention. There is no CVA tenderness. Genitourinary: deferred will be performed by GYN Musculoskeletal: Nontender with normal range of motion in all extremities. No joint effusions.  No lower extremity tenderness nor edema. Neurologic:  Normal speech and language. No gross focal neurologic deficits are appreciated. Speech is normal.  Skin:  Skin is warm, dry and intact. No rash noted. Psychiatric: Mood and affect are normal. Speech and behavior are normal. Patient exhibits appropriate insight and judgment.  ____________________________________________    LABS (pertinent positives/negatives)  Labs Reviewed  CBC WITH DIFFERENTIAL/PLATELET - Abnormal; Notable for the following:    WBC 13.1 (*)    Neutro Abs 9.5 (*)    Basophils Absolute 0.2 (*)    All other components within normal limits  COMPREHENSIVE METABOLIC PANEL - Abnormal; Notable for the following:    Glucose, Bld 106 (*)    Calcium 8.6  (*)    ALT 12 (*)    All other components within normal limits  CULTURE, BLOOD (ROUTINE X 2)  CULTURE, BLOOD (ROUTINE X 2)  LIPASE, BLOOD  LACTIC ACID, PLASMA     RADIOLOGY     US Pelvis Complete (Final result) Result time: 08/20/15 01:44:31   Final result by Rad Results In Interface (08/20/15 01:44:31)   Narrative:   CLINICAL DATA: 39 year old female with pelvic pain for the past several weeks. History of ablation on 07/17/2015 for heavy menstrual bleeding complicated by infection after ablation.  EXAM: TRANSABDOMINAL AND TRANSVAGINAL ULTRASOUND OF PELVIS  TECHNIQUE: Both transabdominal and transvaginal ultrasound  examinations of the pelvis were performed. Transabdominal technique was performed for global imaging of the pelvis including uterus, ovaries, adnexal regions, and pelvic cul-de-sac. It was necessary to proceed with endovaginal exam following the transabdominal exam to visualize the endometrium and ovaries.  COMPARISON: CT of the abdomen and pelvis 07/29/2015.  FINDINGS: Uterus  Measurements: 7.2 x 3.3 x 4.0 cm. No fibroids or other mass visualized.  Endometrium  Thickness: 11.2 mm. Small amount of fluid in the endometrial canal.  Right ovary  Measurements: 3.7 x 2.1 x 2.8 cm. Normal appearance/no adnexal mass. Multiple follicles, largest of which measures up to 2.5 cm in diameter.  Left ovary  Measurements: 3.2 x 1.9 x 2.0 cm. Normal appearance/no adnexal mass. Multiple small follicles.  Other findings  No free fluid.  IMPRESSION: 1. Small amount of fluid in the endometrial canal. Otherwise, normal examination.   Electronically Signed By: Vinnie Langton M.D. On: 08/20/2015 01:44          US Transvaginal Non-OB (Final result) Result time: 08/20/15 01:44:31   Final result by Rad Results In Interface (08/20/15 01:44:31)   Narrative:   CLINICAL DATA: 39 year old female with pelvic pain for the past several weeks.  History of ablation on 07/17/2015 for heavy menstrual bleeding complicated by infection after ablation.  EXAM: TRANSABDOMINAL AND TRANSVAGINAL ULTRASOUND OF PELVIS  TECHNIQUE: Both transabdominal and transvaginal ultrasound examinations of the pelvis were performed. Transabdominal technique was performed for global imaging of the pelvis including uterus, ovaries, adnexal regions, and pelvic cul-de-sac. It was necessary to proceed with endovaginal exam following the transabdominal exam to visualize the endometrium and ovaries.  COMPARISON: CT of the abdomen and pelvis 07/29/2015.  FINDINGS: Uterus  Measurements: 7.2 x 3.3 x 4.0 cm. No fibroids or other mass visualized.  Endometrium  Thickness: 11.2 mm. Small amount of fluid in the endometrial canal.  Right ovary  Measurements: 3.7 x 2.1 x 2.8 cm. Normal appearance/no adnexal mass. Multiple follicles, largest of which measures up to 2.5 cm in diameter.  Left ovary  Measurements: 3.2 x 1.9 x 2.0 cm. Normal appearance/no adnexal mass. Multiple small follicles.  Other findings  No free fluid.  IMPRESSION: 1. Small amount of fluid in the endometrial canal. Otherwise, normal examination.   Electronically Signed By: Vinnie Langton M.D. On: 08/20/2015 01:44           INITIAL IMPRESSION / ASSESSMENT AND PLAN / ED COURSE  Pertinent labs & imaging results that were available during my care of the patient were reviewed by me and considered in my medical decision making (see chart for details).  Based on history, physical exam presenting vital signs and laboratory data patient meets criteria for SIRS. Patient received 30 ML's per kilogram of normal saline IV with improvement in blood pressure from 82/68-115/79. Patient also received IV clindamycin 900 mg. Patient discussed with Dr. Kenton Kingfisher OB[facility] on call for hospital admission.  ____________________________________________   FINAL CLINICAL IMPRESSION(S) /  ED DIAGNOSES  Final diagnoses:  Endometritis      Gregor Hams, MD 08/20/15 [phone]

## 2015-08-20 NOTE — ED Notes (Addendum)
Pharmacy called regarding clindamycin, will send to ED. ED pyxis out of stock.

## 2015-08-20 NOTE — ED Notes (Signed)
MD Brown at bedside.

## 2015-08-21 LAB — CBC WITH DIFFERENTIAL/PLATELET
BASOS PCT: 1 %
Basophils Absolute: 0.1 10*3/uL (ref 0–0.1)
EOS ABS: 0.1 10*3/uL (ref 0–0.7)
Eosinophils Relative: 1 %
HEMATOCRIT: 38.1 % (ref 35.0–47.0)
HEMOGLOBIN: 12.7 g/dL (ref 12.0–16.0)
LYMPHS ABS: 2.4 10*3/uL (ref 1.0–3.6)
Lymphocytes Relative: 27 %
MCH: 30.8 pg (ref 26.0–34.0)
MCHC: 33.3 g/dL (ref 32.0–36.0)
MCV: 92.7 fL (ref 80.0–100.0)
MONO ABS: 0.9 10*3/uL (ref 0.2–0.9)
MONOS PCT: 10 %
Neutro Abs: 5.6 10*3/uL (ref 1.4–6.5)
Neutrophils Relative %: 61 %
Platelets: 195 10*3/uL (ref 150–440)
RBC: 4.11 MIL/uL (ref 3.80–5.20)
RDW: 12.8 % (ref 11.5–14.5)
WBC: 9.1 10*3/uL (ref 3.6–11.0)

## 2015-08-21 LAB — URINE CULTURE

## 2015-08-21 MED ORDER — KETOROLAC TROMETHAMINE 10 MG PO TABS: 10.0000 mg | ORAL_TABLET | Freq: Four times a day (QID) | ORAL | Status: DC | PRN

## 2015-08-21 MED ORDER — DOCUSATE SODIUM 100 MG PO CAPS: 100.0000 mg | ORAL_CAPSULE | Freq: Two times a day (BID) | ORAL | Status: DC

## 2015-08-21 MED ORDER — DOXYCYCLINE HYCLATE 100 MG PO TABS: 100.0000 mg | ORAL_TABLET | Freq: Two times a day (BID) | ORAL | Status: DC

## 2015-08-21 MED ORDER — METRONIDAZOLE 500 MG PO TABS: 500.0000 mg | ORAL_TABLET | Freq: Two times a day (BID) | ORAL | Status: DC

## 2015-08-21 MED ORDER — ONDANSETRON HCL 4 MG PO TABS: 4.0000 mg | ORAL_TABLET | Freq: Three times a day (TID) | ORAL | Status: DC | PRN

## 2015-08-21 MED ORDER — OXYCODONE-ACETAMINOPHEN 5-325 MG PO TABS: 1.0000 | ORAL_TABLET | Freq: Four times a day (QID) | ORAL | Status: DC | PRN

## 2015-08-21 NOTE — Progress Notes (Addendum)
Gynecology Progress Note  Admission Date: 08/19/2015 Current Date: 08/21/2015  Kelly Dorsey is a 39 y.o. HD#2 admitted for abdominal pain and likely dysmenorrhea. PMHx significant for migraines, h/o BTL   ROS and patient/family/surgical history, located on admission H&P note dated 08/19/2015, have been reviewed, and there are no changes except as noted below Yesterday/Overnight Events:  None  Subjective:  Migraine gone, belly pain much improved. No fevers, chills, nausea, vomiting, chest pain, SOB and able to ambulate to bathroom w/o difficulty; pt concerned if BP is normal  Objective:    Current Vital Signs 24h Vital Sign Ranges  T 98.2 F (36.8 C) Temp  Avg: 98.4 F (36.9 C)  Min: 98 F (36.7 C)  Max: 98.7 F (37.1 C)  BP (!) 87/57 mmHg BP  Min: 87/57  Max: 118/78  HR 79 Pulse  Avg: 84  Min: 76  Max: 96  RR 18 Resp  Avg: 18.7  Min: 18  Max: 20  SaO2 99 % RA SpO2  Avg: 99 %  Min: 99 %  Max: 99 %       24 Hour I/O Current Shift I/O  Time Ins Outs 10/27 0701 - 10/28 0700 In: 8119 [P.O.:480; I.V.:1233] Out: 2200 [Urine:2200] 10/28 0701 - 10/28 1900 In: -  Out: 100 [Urine:100]    Patient Vitals for the past 24 hrs:  BP Temp Temp src Pulse Resp SpO2  08/21/15 0741 (!) 87/57 mmHg 98.2 F (36.8 C) Oral 79 18 99 %  08/21/15 0518 (!) 93/49 mmHg 98.2 F (36.8 C) Oral 88 20 -  08/21/15 0034 (!) 97/55 mmHg 98.7 F (37.1 C) Oral 77 20 -  08/20/15 2108 (!) 93/57 mmHg 98.3 F (36.8 C) Oral 76 18 -  08/20/15 1734 108/71 mmHg 98.7 F (37.1 C) Oral 88 18 -  08/20/15 1207 118/78 mmHg 98 F (36.7 C) Oral 96 18 -    Physical exam: NAD +BS, soft, NTTP, ND, no peritoneal s/s RRR no MRGs CTAB No c/c/e  Medications Current Facility-Administered Medications  Medication Dose Route Frequency Provider Last Rate Last Dose  . acetaminophen (TYLENOL) tablet 650 mg  650 mg Oral Q4H PRN Gae Dry, MD      . clonazePAM Bobbye Charleston) tablet 0.5 mg  0.5 mg Oral QHS PRN Aletha Halim, MD      . diphenhydrAMINE (BENADRYL) capsule 25 mg  25 mg Oral QHS PRN Aletha Halim, MD   25 mg at 08/20/15 2132  . doxycycline (VIBRA-TABS) tablet 100 mg  100 mg Oral Q12H Aletha Halim, MD   100 mg at 08/21/15 0122  . Influenza vac split quadrivalent PF (FLUARIX) injection 0.5 mL  0.5 mL Intramuscular Tomorrow-1000 Gae Dry, MD      . ketorolac (TORADOL) 30 MG/ML injection 30 mg  30 mg Intravenous 4 times per day Aletha Halim, MD   30 mg at 08/21/15 0325  . metroNIDAZOLE (FLAGYL) tablet 500 mg  500 mg Oral BID WC Aletha Halim, MD   500 mg at 08/21/15 0714  . ondansetron (ZOFRAN) tablet 4 mg  4 mg Oral Q6H PRN Gae Dry, MD   4 mg at 08/21/15 0459  . oxyCODONE-acetaminophen (PERCOCET/ROXICET) 5-325 MG per tablet 1-2 tablet  1-2 tablet Oral Q3H PRN Gae Dry, MD   2 tablet at 08/20/15 1023  . pantoprazole (PROTONIX) EC tablet 40 mg  40 mg Oral QAC breakfast Gae Dry, MD   40 mg at 08/21/15 0714  . rizatriptan (Hamel)  tablet 10 mg  10 mg Oral BID PRN Honor Loh Ward, MD        Recent Labs Lab 08/20/15 0005 08/21/15 0449  WBC 13.1* 9.1  HGB 13.8 12.7  HCT 42.0 38.1  PLT 228 195     Recent Labs Lab 08/20/15 0005  NA 136  K 3.5  CL 106  CO2 25  BUN 8  CREATININE 0.88  CALCIUM 8.6*  PROT 6.9  BILITOT 0.8  ALKPHOS 56  ALT 12*  AST 15  GLUCOSE 106*   Negative: lipase, lactic acid, urine GC/CT, U/A, HCx  BCX x 2: NGTD  Anaerobic GU culture: GNR  Radiology TECHNIQUE: Both transabdominal and transvaginal ultrasound examinations of the pelvis were performed. Transabdominal technique was performed for global imaging of the pelvis including uterus, ovaries, adnexal regions, and pelvic cul-de-sac. It was necessary to proceed with endovaginal exam following the transabdominal exam to visualize the endometrium and ovaries.  COMPARISON: CT of the abdomen and pelvis 07/29/2015.  FINDINGS: Uterus  Measurements: 7.2 x 3.3 x 4.0  cm. No fibroids or other mass visualized.  Endometrium  Thickness: 11.2 mm. Small amount of fluid in the endometrial canal.  Right ovary  Measurements: 3.7 x 2.1 x 2.8 cm. Normal appearance/no adnexal mass. Multiple follicles, largest of which measures up to 2.5 cm in diameter.  Left ovary  Measurements: 3.2 x 1.9 x 2.0 cm. Normal appearance/no adnexal mass. Multiple small follicles.  Other findings  No free fluid.  IMPRESSION: 1. Small amount of fluid in the endometrial canal. Otherwise, normal examination.  Assessment & Plan:  Pt doing well *GYN: pt much improved. D/c cefoxitin and just on PO now. If still doing well in late afternoon likely can d/c to home on PO abx course *BP: pt told that since she's asymptomatic and HR is normal exp management okay and it could just be her normal *Migraines: continue home meds (propranolol) and will add on toradol to help with this and cramps *Pain: see above and PO PRNs *FEN/GI: regular diet, can SLIV MIVF *PPx: OOB ad lib *Dispo: possibly late afternoon today  Code Status: Full Code  Durene Romans MD [facility] Pager: [phone]

## 2015-08-21 NOTE — Progress Notes (Addendum)
Patient discharged home. Discharge instructions, prescriptions and follow up appointment given to and reviewed with patient. Patients home meds returned to her. Patient verbalized understanding. Escorted out by Radene Journey, NT.

## 2015-08-21 NOTE — Progress Notes (Signed)
GYN Note  Patient Vitals for the past 12 hrs:  BP Temp Temp src Pulse Resp SpO2  08/21/15 1126 96/63 mmHg 98.3 F (36.8 C) Oral 94 18 99 %  08/21/15 0741 (!) 87/57 mmHg 98.2 F (36.8 C) Oral 79 18 99 %  08/21/15 0518 (!) 93/49 mmHg 98.2 F (36.8 C) Oral 88 20 -   Patient resting. Negative ROS. Patient states that she feels "weak." She has been able to ambulate to bathroom fine but she hasn't ambulated in the halls yet. I told her that I believe that she is medically fine to go home but to reassure ourselves, she needs to do more activity here in order to simulate home activities so that way she feels ready to go. I told her that I believe her s/s are due to dysmenorrhea and low chance of infection but if she has an infection, she's getting better and the PO abx are helping and it will get better with time and/or if she has dysmenorrhea that it will get better with time. Pt would like to stay one more night which I told her is fine but I want her to do more activities and not just lay in bed. Pt amenable to plan.   Durene Romans MD [facility]  Pager: [phone]

## 2015-08-21 NOTE — Discharge Summary (Addendum)
Discharge Summary   Admit Date: 08/19/2015 Discharge Date: 08/21/2015 Discharging Service: Gynecology  Primary OBGYN: [facility] [place] Admitting Physician: Gae Dry, MD  Discharge Physician: Durene Romans MD  Referring Provider: [facility]  Primary Care Provider: Leonard Downing, MD  Admission Diagnoses: Endometritis [N71.9] Pelvic pain in female [R10.2] History of Novasure endometrial ablation on 9/23  Discharge Diagnoses: Dysmenorrhea vs Endometritis  Consult Orders: None   Surgeries/Procedures Performed: None  History and Physical: Reason for Consultation: Lower abdominal pain  History of Present Illness: Kelly Dorsey is a 39 y.o. No obstetric history on file. (Patient's last menstrual period was 07/28/2015.), with the above CC. Pt had D&C with Endometrial Ablation 07/17/15 and was noted to have post op discharge significantly after the procedure; seen in office a number of times, taken oral ABX. Now, she presents w 2 day h/o lower abd pain and reported fever today. Felt malaise and weakness tonight along with the severe pains in abdomen.   ROS: A 12-point review of systems was performed and negative, except as stated in the above HPI.  OBGYN History: As per HPI. OB History    No data available     Past Medical History: Past Medical History  Diagnosis Date  . Migraine   . GERD (gastroesophageal reflux disease)   . Depression   . Anxiety   . Complication of anesthesia     AFTER BTL IN 2006, BP BECAME VERY ELEVATED-WITH EPIDURAL DURING DELIVERY, PT STARTED VOMITING AN SHAKING AND EPIDURAL HAD TO BE STOPPED  . Thyroid nodule     Past Surgical History: Past Surgical History  Procedure Laterality Date  . Tubal ligation    . Appendectomy    . Laparoscopy abdomen diagnostic    . Lasik    . Colonoscopy    . Dilitation & currettage/hystroscopy with novasure ablation N/A 07/17/2015     Procedure: DILATATION & CURETTAGE/HYSTEROSCOPY WITH NOVASURE ABLATION; Surgeon: Aletha Halim, MD; Location: [facility] ORS; Service: Gynecology; Laterality: N/A;    Family History:  No family history on file. She denies any female cancers, bleeding or blood clotting disorders.   Social History:  Social History   Social History  . Marital Status: Widowed    Spouse Name: N/A  . Number of Children: N/A  . Years of Education: N/A   Occupational History  . Not on file.   Social History Main Topics  . Smoking status: Current Every Day Smoker -- 0.50 packs/day for 22 years    Types: Cigarettes  . Smokeless tobacco: Not on file  . Alcohol Use: Yes     Comment: rarely  . Drug Use: No  . Sexual Activity: Not on file   Other Topics Concern  . Not on file   Social History Narrative   Allergy: Allergies  Allergen Reactions  . Other     EPIDURAL WITH DELIVERY MADE PT VERY SICK-VOMITING AND UNCONTROLLABLE SHAKING    Current Outpatient Medications:  (Not in a hospital admission)   Hospital Medications: No current facility-administered medications for this encounter.   Current Outpatient Prescriptions  Medication Sig Dispense Refill  . amphetamine-dextroamphetamine (ADDERALL) 10 MG tablet Take 10 mg by mouth daily with breakfast.    . clonazePAM (KLONOPIN) 0.5 MG tablet Take 0.5 mg by mouth at bedtime.    . docusate sodium (COLACE) 100 MG capsule Take 1 capsule (100 mg total) by mouth 2 (two) times daily. (Patient not taking: Reported on 08/20/2015) 15 capsule 0  . ibuprofen (MOTRIN IB) 200 MG  tablet Take 1 tablet (200 mg total) by mouth every 6 (six) hours as needed. (Patient taking differently: Take 200 mg by mouth every 6 (six) hours as needed for moderate pain. ) 45 tablet 1  . omeprazole (PRILOSEC) 20 MG capsule Take 20 mg by mouth as needed.     Marland Kitchen oxyCODONE-acetaminophen  (ROXICET) 5-325 MG per tablet Take 2 tablets by mouth every 6 (six) hours as needed for severe pain. (Patient not taking: Reported on 08/20/2015) 4 tablet 0  . propranolol ER (INDERAL LA) 120 MG 24 hr capsule Take 240 mg by mouth at bedtime.     . rizatriptan (MAXALT) 10 MG tablet Take 10 mg by mouth as needed. For migraines. May repeat in 2 hours if needed       Physical Exam: Filed Vitals:   08/19/15 2333 08/20/15 0000 08/20/15 0253  BP: 82/68 115/79 110/70  Pulse: 103 102 78  Temp: 98.4 F (36.9 C)    TempSrc: Oral    Resp: 18 18 16   Height: 5\' 6"  (1.676 m)    Weight: 134 lb (60.782 kg)    SpO2: 99% 100% 99%   Body mass index is 21.64 kg/(m^2). General appearance: Well nourished, well developed female in no acute distress.  Neck: Supple, normal appearance, and no thyromegaly  Cardiovascular:Regular rate and rhythm. No murmurs, rubs or gallops. Respiratory: Clear to auscultation bilateral. Normal respiratory effort Abdomen: positive bowel sounds and no masses, hernias; diffusely tender to palpation but esp over lower abd midline, non distended Neuro/Psych: Normal mood and affect.  Skin: Warm and dry.  Lymphatic: No inguinal lymphadenopathy.   Pelvic exam: is not limited by body habitus EGBUS: within normal limits, Vagina: within normal limits and with no blood and scant d/c in the vault, Cervix: Normal but CMT on exam, Uterus: Tender but firm, normal size; and Adnexa: No mass  Last Labs      Recent Labs Lab 08/20/15 0005  WBC 13.1*  HGB 13.8  HCT 42.0  PLT 228      Last Labs      Recent Labs Lab 08/20/15 0005  NA 136  K 3.5  CL 106  CO2 25  BUN 8  CREATININE 0.88  CALCIUM 8.6*  PROT 6.9  BILITOT 0.8  ALKPHOS 56  ALT 12*  AST 15  GLUCOSE 106*      Last Labs     No results for input(s): APTT, INR, PTT in the last 168 hours.  Invalid input(s): DRHAPTT     Last Labs     No results for input(s): ABORH in the last 168 hours.    Imaging:  See [place] report  Assessment: Kelly Dorsey is a 39 y.o.(Patient's last menstrual period was 07/28/2015.) who presented to the ED with complaints of abdominal pain; findings are consistent with uterine pain either from Endometritis related to prior procedure or other uterine pain from prior menorrhagia now after an ablation procedure--- due to tachycardia and hypotension and small elevation in WBC will treat as endometritis in short term with IV ABX.  Plan: Kelly Dorsey. IVF. Analgesia.  Barnett Applebaum, MD [facility] Pager [phone]      Hospital Course: *GYN: Patient abx changed to Cefoxitin and Doxycycline and then patient transitioned to PO Doxy/Flagyl and was still afebrile. Pt felt pain had improved and discharge diagnosis of dysmenorrhea vs endometritis. Patient wasn't having any problems with PO on discharge to home -GC/CT, UCx (both obtained after abx were started) were negative.  Recent Labs  Lab 08/20/15 0005 08/21/15 0449  WBC 13.1* 9.1  HGB 13.8 12.7  HCT 42.0 38.1  PLT 228 195   *BP: pt told that since she's asymptomatic and HR is normal exp management okay and it could just be her normal *Migraines: continue home meds (propranolol) and helped with toradol while inpatient *Pain: controlled with PO meds  Discharge Exam:  Filed Vitals:   08/21/15 0518 08/21/15 0741 08/21/15 1126 08/21/15 1633  BP: 93/49 87/57 96/63  107/65  Pulse: 88 79 94 78  Temp: 98.2 F (36.8 C) 98.2 F (36.8 C) 98.3 F (36.8 C) 98.2 F (36.8 C)  TempSrc: Oral Oral Oral Oral  Resp: 20 18 18 18   Height:      Weight:      SpO2:  99% 99% 99%   NAD +BS, soft, NTTP, ND, no peritoneal s/s RRR no MRGs CTAB No c/c/e  Discharge Disposition:  Home  Patient Instructions:  Standard   Results Pending at Discharge:  None  Discharge Medications:   Medication List    STOP taking these  medications        ibuprofen 200 MG tablet  Commonly known as:  MOTRIN IB      TAKE these medications        amphetamine-dextroamphetamine 10 MG tablet  Commonly known as:  ADDERALL  Take 10 mg by mouth daily with breakfast.     clonazePAM 0.5 MG tablet  Commonly known as:  KLONOPIN  Take 0.5 mg by mouth at bedtime.     docusate sodium 100 MG capsule  Commonly known as:  COLACE  Take 1 capsule (100 mg total) by mouth 2 (two) times daily.     docusate sodium 100 MG capsule  Commonly known as:  COLACE  Take 1 capsule (100 mg total) by mouth 2 (two) times daily.     doxycycline 100 MG tablet  Commonly known as:  VIBRA-TABS  Take 1 tablet (100 mg total) by mouth every 12 (twelve) hours.     ketorolac 10 MG tablet  Commonly known as:  TORADOL  Take 1 tablet (10 mg total) by mouth every 6 (six) hours as needed for moderate pain or severe pain.     metroNIDAZOLE 500 MG tablet  Commonly known as:  FLAGYL  Take 1 tablet (500 mg total) by mouth 2 (two) times daily.     omeprazole 20 MG capsule  Commonly known as:  PRILOSEC  Take 20 mg by mouth as needed.        oxyCODONE-acetaminophen 5-325 MG tablet #20 given  Commonly known as:  PERCOCET/ROXICET  Take 1-2 tablets by mouth every 6 (six) hours as needed for severe pain.     propranolol ER 120 MG 24 hr capsule  Commonly known as:  INDERAL LA  Take 240 mg by mouth at bedtime.     rizatriptan 10 MG tablet  Commonly known as:  MAXALT  Take 10 mg by mouth as needed. For migraines. May repeat in 2 hours if needed         Will have the office call patient to make 1wk follow up  Durene Romans. MD [facility] Pager [phone]

## 2015-08-21 NOTE — Discharge Instructions (Signed)
° ° °  We will discuss your surgery once again in detail at your post-op visit in two to four weeks. If you havent already done so, please call to make your appointment as soon as possible.  [facility] [place]  [address] [place], [place] [address] [place], [place] [address]  Phone: [phone] Phone: [phone]  Fax: [phone] Fax: [phone]

## 2015-08-24 LAB — WOUND CULTURE

## 2015-08-25 LAB — CULTURE, BLOOD (ROUTINE X 2)
CULTURE: NO GROWTH
Culture: NO GROWTH

## 2015-08-25 LAB — ANAEROBIC CULTURE

## 2016-08-16 ENCOUNTER — Ambulatory Visit (INDEPENDENT_AMBULATORY_CARE_PROVIDER_SITE_OTHER): Payer: Medicaid Other | Admitting: Neurology

## 2016-08-16 ENCOUNTER — Encounter: Payer: Self-pay | Admitting: Neurology

## 2016-08-16 ENCOUNTER — Other Ambulatory Visit: Payer: Self-pay | Admitting: *Deleted

## 2016-08-16 VITALS — BP 113/73 | HR 102 | Resp 16 | Ht 66.0 in | Wt 143.0 lb

## 2016-08-16 DIAGNOSIS — G43709 Chronic migraine without aura, not intractable, without status migrainosus: Secondary | ICD-10-CM | POA: Insufficient documentation

## 2016-08-16 DIAGNOSIS — R51 Headache: Secondary | ICD-10-CM | POA: Diagnosis not present

## 2016-08-16 DIAGNOSIS — G43001 Migraine without aura, not intractable, with status migrainosus: Secondary | ICD-10-CM

## 2016-08-16 DIAGNOSIS — G43701 Chronic migraine without aura, not intractable, with status migrainosus: Secondary | ICD-10-CM | POA: Diagnosis not present

## 2016-08-16 DIAGNOSIS — G43909 Migraine, unspecified, not intractable, without status migrainosus: Secondary | ICD-10-CM | POA: Insufficient documentation

## 2016-08-16 DIAGNOSIS — H539 Unspecified visual disturbance: Secondary | ICD-10-CM

## 2016-08-16 DIAGNOSIS — R519 Headache, unspecified: Secondary | ICD-10-CM

## 2016-08-16 MED ORDER — ONABOTULINUMTOXINA 100 UNITS IJ SOLR: INTRAMUSCULAR | 3 refills | Status: DC

## 2016-08-16 NOTE — Patient Instructions (Signed)
Remember to drink plenty of fluid, eat healthy meals and do not skip any meals. Try to eat protein with a every meal and eat a healthy snack such as fruit or nuts in between meals. Try to keep a regular sleep-wake schedule and try to exercise daily, particularly in the form of walking, 20-30 minutes a day, if you can.   I would like to see you back for botox, sooner if we need to. Please call us with any interim questions, concerns, problems, updates or refill requests.   Our phone number is [phone]. We also have an after hours call service for urgent matters and there is a physician on-call for urgent questions. For any emergencies you know to call 911 or go to the nearest emergency room

## 2016-08-16 NOTE — Progress Notes (Addendum)
[facility]    Provider:  Dr Jaynee Eagles Referring Provider: Leonard Downing, * Primary Care Physician:  Leonard Downing, MD  CC:  Migraines  Addendum 03/2017: Her fiance has noticed that she snores, grinds her teeth and sometimes grasps for air at night. She reports waking up every morning with a headache. [name] that a previous doctor had recommended she have a sleep study but she never had it done. Requested sleep eval.  HPI:  Kelly Dorsey is a 40 y.o. female here as a referral from Dr. Arelia Sneddon for migraines PMHx depression, anxiety, current smoker, migraines. She has had migraines since the age of 35. They can be in the forehead and in the eyes, the main headaches are on the right side, pressure behind the eye, sensitivity to light, sound, smells, has to go into a dark room and sleep. Dark rooms make it better. She has nausea and vomits. Migraines 3-4x a week on average 16 or more a month. No aura. They can last up to 3 days on average 12-16 hours. She has nausea and vomiting. Maxalt helps. Worse with season changes, pressure outside makes it worse. She has kept multiple migraine diaries and no foods were identified. Stress and anxiety makes it worse. Can be severe but on average 10/10 in pain. It comes on quickly, not a slow build up. She takes her maxalt and she lays down it helps but doesn't stop it. She is smoking. She is not interested in taking anything else. She has a FHx of migraines in cousins and grandmother and son.   Meds tried: Rizatriptan, Depakote, propranolol, tramadol, cymbalta, topamax, amitriptyline, imitrex and multiple others over the last 20+ years  Reviewed notes, labs and imaging from outside physicians, which showed:  Reviewed notes from [facility]. 3 migraines daily. She has left work multiple times a week. Pressure in her head daily. On cyclobenzaprine, mixed amphetamines and clonazepam. It appears they tried Depakote  which made her depressed, horrible dreams like nightmares but it helped her headaches and at some point possibly they lowered the dose to 125 daily delayed release. They also tried rizatriptan. Clonazepam. Handwritten notes that I can't read. They tried propranolol.  CBC in October 26 was normal, CMP in October 2016 was unremarkable with normal creatinine 0.88 and normal BUN 8  Review of Systems: Patient complains of symptoms per HPI as well as the following symptoms; aching muscles. Pertinent negatives per HPI. All others negative.   Social History   Social History  . Marital status: Widowed    Spouse name: N/A  . Number of children: 3  . Years of education: 10   Occupational History  . None    Social History Main Topics  . Smoking status: Current Every Day Smoker    Packs/day: 0.50    Years: 22.00    Types: Cigarettes  . Smokeless tobacco: Never Used  . Alcohol use Yes     Comment: rarely  . Drug use: No  . Sexual activity: Not on file   Other Topics Concern  . Not on file   Social History Narrative   Drinks about 1-2 caffeine drinks a day     Family History  Problem Relation Age of Onset  . Hypertension Mother   . Hypertension Father   . Diabetes Father   . Migraines Father   . Migraines Brother   . Migraines Paternal Grandmother   . Migraines Paternal Grandfather     Past Medical History:  Diagnosis Date  . Anxiety   . Complication of anesthesia    AFTER BTL IN 2006, BP BECAME VERY ELEVATED-WITH EPIDURAL DURING DELIVERY, PT STARTED VOMITING AN SHAKING AND EPIDURAL HAD TO BE STOPPED  . Depression   . GERD (gastroesophageal reflux disease)   . Migraine   . Thyroid nodule     Past Surgical History:  Procedure Laterality Date  . APPENDECTOMY    . COLONOSCOPY    . DILITATION & CURRETTAGE/HYSTROSCOPY WITH NOVASURE ABLATION N/A 07/17/2015   Procedure: DILATATION & CURETTAGE/HYSTEROSCOPY WITH NOVASURE ABLATION;  Surgeon: Aletha Halim, MD;  Location: [facility]  ORS;  Service: Gynecology;  Laterality: N/A;  . LAPAROSCOPY ABDOMEN DIAGNOSTIC    . LASIK    . TUBAL LIGATION      Current Outpatient Prescriptions  Medication Sig Dispense Refill  . amphetamine-dextroamphetamine (ADDERALL) 10 MG tablet Take 10 mg by mouth daily with breakfast.    . clonazePAM (KLONOPIN) 0.5 MG tablet Take 0.5 mg by mouth at bedtime.    . ondansetron (ZOFRAN) 4 MG tablet Take 1 tablet (4 mg total) by mouth every 8 (eight) hours as needed for nausea or vomiting. 10 tablet 0  . rizatriptan (MAXALT) 10 MG tablet Take 10 mg by mouth as needed. For migraines. May repeat in 2 hours if needed    . botulinum toxin Type A (BOTOX) 100 units SOLR injection Inject 200units into head and neck muscles by provider every 3 months in the office 2 vial 3   No current facility-administered medications for this visit.     Allergies as of 08/16/2016 - Review Complete 08/16/2016  Allergen Reaction Noted  . Other  07/09/2015    Vitals: BP 113/73   Pulse (!) 102   Resp 16   Ht 5\' 6"  (1.676 m)   Wt 143 lb (64.9 kg)   BMI 23.08 kg/m  Last Weight:  Wt Readings from Last 1 Encounters:  08/16/16 143 lb (64.9 kg)   Last Height:   Ht Readings from Last 1 Encounters:  08/16/16 5\' 6"  (1.676 m)   Physical exam: Exam: Gen: NAD, conversant, well nourised, well groomed                     CV: RRR, no MRG. No Carotid Bruits. No peripheral edema, warm, nontender Eyes: Conjunctivae clear without exudates or hemorrhage  Neuro: Detailed Neurologic Exam  Speech:    Speech is normal; fluent and spontaneous with normal comprehension.  Cognition:    The patient is oriented to person, place, and time;     recent and remote memory intact;     language fluent;     normal attention, concentration,     fund of knowledge Cranial Nerves:    The pupils are equal, round, and reactive to light. The fundi are normal and spontaneous venous pulsations are present. Visual fields are full to finger  confrontation. Extraocular movements are intact. Trigeminal sensation is intact and the muscles of mastication are normal. The face is symmetric. The palate elevates in the midline. Hearing intact. Voice is normal. Shoulder shrug is normal. The tongue has normal motion without fasciculations.   Coordination:    Normal finger to nose and heel to shin. Normal rapid alternating movements.   Gait:    Heel-toe and tandem gait are normal.   Motor Observation:    No asymmetry, no atrophy, and no involuntary movements noted. Tone:    Normal muscle tone.    Posture:    Posture  is normal. normal erect    Strength:    Strength is V/V in the upper and lower limbs.      Sensation: intact to LT     Reflex Exam:  DTR's:    Deep tendon reflexes in the upper and lower extremities are normal bilaterally.   Toes:    The toes are downgoing bilaterally.   Clonus:    Clonus is absent.       Assessment/Plan:  40 year old with chronic migraines without aura with status migrainosus not intractabke  - She has tried and failed most first line and second line agents over the last 20+ years. She does not want to try anymore medications. - Botox for migraine ordered. - MRI of the brain for worsening, changing headaches with vision changes (she has never had brain imaging) - Smoking: encouraged cessation, can worsen migraines  Addendum 03/2017: Her fiance has noticed that she snores, grinds her teeth and sometimes grasps for air at night. She reports waking up every morning with a headache. [name] that a previous doctor had recommended she have a sleep study but she never had it done. Requested sleep eval.  To prevent or relieve headaches, try the following: Cool Compress. Lie down and place a cool compress on your head.  Avoid headache triggers. If certain foods or odors seem to have triggered your migraines in the past, avoid them. A headache diary might help you identify triggers.  Include physical  activity in your daily routine. Try a daily walk or other moderate aerobic exercise.  Manage stress. Find healthy ways to cope with the stressors, such as delegating tasks on your to-do list.  Practice relaxation techniques. Try deep breathing, yoga, massage and visualization.  Eat regularly. Eating regularly scheduled meals and maintaining a healthy diet might help prevent headaches. Also, drink plenty of fluids.  Follow a regular sleep schedule. Sleep deprivation might contribute to headaches Consider biofeedback. With this mind-body technique, you learn to control certain bodily functions - such as muscle tension, heart rate and blood pressure - to prevent headaches or reduce headache pain.    Proceed to emergency room if you experience new or worsening symptoms or symptoms do not resolve, if you have new neurologic symptoms or if headache is severe, or for any concerning symptom.   Cc: Dr. Molli Knock, MD  [facility] [address] [place], [place] [address]  Phone [phone] Fax [phone]

## 2016-08-22 ENCOUNTER — Telehealth: Payer: Self-pay | Admitting: Neurology

## 2016-08-22 NOTE — Telephone Encounter (Signed)
Kelly Dorsey, patient has tried multiple medications documented in my note:  Meds tried: Rizatriptan, Depakote, propranolol, tramadol, cymbalta, topamax, amitriptyline, imitrex and multiple others over the last 20+ years  Depakote, topamax are antiepileptics. Propranolol is a beta blocker. Amitriptyline and cymbalta are anti-depressants. This is 3 classes. Let me know thanks

## 2016-08-22 NOTE — Telephone Encounter (Signed)
The patient has been denied due to not trying enough drug classes before the botox. I faxed over clinicals for this patient and they sent a denial saying that she only met one of the required medication fields (anticonvulsants) each for at least three months of therapy. She will need two medications from Beta Blockers, Calcium Channel Blockers, or Tricyclic antidepressants. Please advise me on how to proceed.

## 2016-08-23 NOTE — Telephone Encounter (Signed)
I called Medicaid yesterday before I sent the phone note to you and relayed that same information and they said that they were not considering that three drugs classes. I read every medication listed in the patients chard and they said that it was still going to be denied without further documentation. Would you like me to call back and see if someone could speak with you for a peer to peer?

## 2016-08-23 NOTE — Telephone Encounter (Signed)
Called medicaid again and asked to speak with someone in denials. I spoke with a different rep this time who told me the same thing about the denial. I explained the situation to him and read verbatim the previous statement from Dr. Jaynee Eagles. I informed him that all of this information was also listed in Dr. Ferdinand Lango original office visit notes that were sent in the the auth request.He told me he would resubmit this information to a case reviewer and I would have to call back and check status later. I will call and check later today.

## 2016-08-23 NOTE — Telephone Encounter (Signed)
Thank you :)

## 2016-08-24 ENCOUNTER — Ambulatory Visit: Payer: Medicaid Other | Admitting: Neurology

## 2016-08-27 ENCOUNTER — Ambulatory Visit
Admission: RE | Admit: 2016-08-27 | Discharge: 2016-08-27 | Disposition: A | Discharge status: Home / Self Care | Payer: Medicaid Other | Source: Ambulatory Visit | Attending: Neurology | Admitting: Neurology

## 2016-08-27 DIAGNOSIS — G43001 Migraine without aura, not intractable, with status migrainosus: Secondary | ICD-10-CM

## 2016-08-27 DIAGNOSIS — R519 Headache, unspecified: Secondary | ICD-10-CM

## 2016-08-27 DIAGNOSIS — H539 Unspecified visual disturbance: Secondary | ICD-10-CM | POA: Diagnosis not present

## 2016-08-27 DIAGNOSIS — R51 Headache: Secondary | ICD-10-CM | POA: Diagnosis not present

## 2016-08-27 MED ORDER — GADOBENATE DIMEGLUMINE 529 MG/ML IV SOLN
13.0000 mL | Freq: Once | INTRAVENOUS | Status: AC | PRN
  Administered 2016-08-27: 13 mL via INTRAVENOUS

## 2016-08-29 ENCOUNTER — Telehealth: Payer: Self-pay | Admitting: *Deleted

## 2016-08-29 NOTE — Telephone Encounter (Signed)
Called and spoke w/ pt about normal MRI brain. She verbalized understanding.   She cx appt for botox last week d/t her insurance not covering it. [facility] and stated medication was there so she is a little confused. She would like a call back from [place] to discuss this. Advised I will send message to Andee Poles to have her call her sometime this week. She verbalized understanding.

## 2016-08-29 NOTE — Telephone Encounter (Signed)
-----   Message from Melvenia Beam, MD sent at 08/28/2016  8:33 PM EST ----- MRI of the brain is normal thanks

## 2016-10-13 ENCOUNTER — Other Ambulatory Visit: Payer: Self-pay | Admitting: Neurology

## 2016-10-13 ENCOUNTER — Telehealth: Payer: Self-pay | Admitting: Neurology

## 2016-10-13 MED ORDER — PROCHLORPERAZINE MALEATE 10 MG PO TABS: 10.0000 mg | ORAL_TABLET | Freq: Four times a day (QID) | ORAL | 6 refills | Status: DC | PRN

## 2016-10-13 MED ORDER — ZOLMITRIPTAN 5 MG PO TABS: 5.0000 mg | ORAL_TABLET | Freq: Once | ORAL | 11 refills | Status: DC | PRN

## 2016-10-13 MED ORDER — METHYLPREDNISOLONE 4 MG PO TBPK: ORAL_TABLET | ORAL | 1 refills | Status: DC

## 2016-10-13 MED ORDER — ZOLMITRIPTAN 5 MG PO TABS
5.0000 mg | ORAL_TABLET | Freq: Once | ORAL | 11 refills | Status: DC | PRN
Start: 2016-10-13 — End: 2016-10-13

## 2016-10-13 NOTE — Telephone Encounter (Signed)
Pt calling stating she is having increased worsening headaches with a lot of pressure and causes nausea.  Pt is wanting to know if she is needing to come in an be seen or if something could possibly be called in. Please call

## 2016-10-13 NOTE — Telephone Encounter (Signed)
I can call in a triptan and a steroid taper for the headaches.   The most common side-effects for Triptans: are feeling sick (nausea), dizziness and dry mouth. In addition, triptans can also cause some people to experience strange sensations. These may be a tightness, tingling, flushing, and feelings of heaviness or pressure in areas such as the face and limbs, and occasionally the chest. Serious side effects can include stroke, cardiac side effects such as chest tightness, shortness of breath and possible cardiovascular adverse effects.  Steroid taper: stop for any side effects such as mania, insomnia, elevated BP, elevated glucose or any side effects

## 2016-10-13 NOTE — Telephone Encounter (Signed)
Dr Ahern- please advise 

## 2016-10-13 NOTE — Telephone Encounter (Signed)
Called and spoke to pt. Relayed DR Jaynee Eagles message below. She is agreeable to this plan. She would also like something for nausea. Advised per AA,MD she is going to call in compazine. Went over [employer] Made f/u with CM,NP on 11/01/15 at 1245pm, check in 1230pm. She verbalized understanding.

## 2016-10-20 ENCOUNTER — Telehealth: Payer: Self-pay | Admitting: *Deleted

## 2016-10-20 NOTE — Telephone Encounter (Signed)
Called and spoke with Melasha with [facility]. [id number]. She transferred me to the pharmacy. I spoke with Katie. [id number].  Initiated PA for zomig 5mg  tablet. [id number]. Awaiting response from pharmacist. She stated they have up to 24 hours for reply. She advised to call back to find out response.

## 2016-10-26 NOTE — Telephone Encounter (Signed)
Called [employer] back to find out status of PA zomig. Spoke with [facility] in the contact center. Call ref # [id number].  PA approved from 10/20/16-10/15/17. Approval [id number].

## 2016-10-31 ENCOUNTER — Ambulatory Visit: Payer: Self-pay | Admitting: Nurse Practitioner

## 2016-11-03 ENCOUNTER — Encounter: Payer: Self-pay | Admitting: Nurse Practitioner

## 2016-11-03 ENCOUNTER — Ambulatory Visit (INDEPENDENT_AMBULATORY_CARE_PROVIDER_SITE_OTHER): Payer: Medicaid Other | Admitting: Nurse Practitioner

## 2016-11-03 VITALS — BP 106/72 | HR 86 | Ht 66.0 in | Wt 148.4 lb

## 2016-11-03 DIAGNOSIS — G43701 Chronic migraine without aura, not intractable, with status migrainosus: Secondary | ICD-10-CM | POA: Diagnosis not present

## 2016-11-03 NOTE — Patient Instructions (Signed)
Will call [place] [facility]  [facility]ig has been PA [facility] Check with Andee Poles regarding Botox on the way out F/U with Dr. Jaynee Eagles for Botox

## 2016-11-03 NOTE — Progress Notes (Addendum)
[facility]  PATIENT: Kelly Dorsey DOB: 17-May-1976   REASON FOR VISIT: follow up for migraine  HISTORY FROM:patient    Addendum 6/18: Her fiance has noticed that she snores, grinds her teeth and sometimes grasps for air at night. She reports waking up every morning with a headache. Kelly Dorsey that a previous doctor had recommended she have a sleep study but she never had it done.  HISTORY OF PRESENT ILLNESS:  Kelly Dorsey, 41 year old female returns for follow-up. She has a long history of migraines and has been on multiple preventive medications without benefit. Fortunately MRI brain is normal. She called in to the office on 10/13/2016 for worsening headache with pressure and some nausea. A steroid taper was called in along with Compazine. Steroid dose pack was very beneficial, she does not have a headache today. In addition she has Zomig to take acutely however [facility] has just given the prior MRI on January 3 but has not relayed that information to the patient's pharmacy. In addition Botox has been ordered for her after failing first and second line agents. Patient was also encouraged to quit smoking and increase her fluid intake. She returns for reevaluation She is not interested interested in other preventative medications. She is aware of her migraine triggers which are basically odors, stress and lack of sleep  HISTORY 08/16/16 Kelly Dorsey is a 41 y.o. female here as a referral from Dr. Arelia Sneddon for migraines PMHx depression, anxiety, current smoker, migraines. She has had migraines since the age of 73. They can be in the forehead and in the eyes, the main headaches are on the right side, pressure behind the eye, sensitivity to light, sound, smells, has to go into a dark room and sleep. Dark rooms make it better. She has nausea and vomits. Migraines 3-4x a week on average 16 or more a month. No aura. They can last up to 3 days on average 12-16 hours. She has nausea  and vomiting. Maxalt helps. Worse with season changes, pressure outside makes it worse. She has kept multiple migraine diaries and no foods were identified. Stress and anxiety makes it worse. Can be severe but on average 10/10 in pain. It comes on quickly, not a slow build up. She takes her maxalt and she lays down it helps but doesn't stop it. She is smoking. She is not interested in taking anything else. She has a FHx of migraines in cousins and grandmother and son.   Meds tried: Rizatriptan, Depakote, propranolol, tramadol, cymbalta, topamax, amitriptyline, imitrex and multiple others over the last 20+ years   REVIEW OF SYSTEMS: Full 14 system review of systems performed and notable only for those listed, all others are neg:  Constitutional: neg  Cardiovascular: neg Ear/Nose/Throat: neg  Skin: neg Eyes: Light sensitivity Respiratory: neg Gastroitestinal: Nausea Hematology/Lymphatic: neg  Endocrine: neg Musculoskeletal: Neck stiffness Allergy/Immunology: neg Neurological: Headache Psychiatric: Depression and anxiety Sleep : neg   ALLERGIES: Allergies  Allergen Reactions  . Other     EPIDURAL WITH DELIVERY MADE PT VERY SICK-VOMITING AND UNCONTROLLABLE SHAKING    HOME MEDICATIONS: Outpatient Medications Prior to Visit  Medication Sig Dispense Refill  . amphetamine-dextroamphetamine (ADDERALL) 10 MG tablet Take 10 mg by mouth daily with breakfast.    . clonazePAM (KLONOPIN) 0.5 MG tablet Take 0.5 mg by mouth at bedtime.    . prochlorperazine (COMPAZINE) 10 MG tablet Take 1 tablet (10 mg total) by mouth every 6 (six) hours as needed for nausea or vomiting.  30 tablet 6  . rizatriptan (MAXALT) 10 MG tablet Take 10 mg by mouth as needed. For migraines. May repeat in 2 hours if needed    . botulinum toxin Type A (BOTOX) 100 units SOLR injection Inject 200units into head and neck muscles by provider every 3 months in the office (Patient not taking: Reported on 11/03/2016) 2 vial 3  .  zolmitriptan (ZOMIG) 5 MG tablet Take 1 tablet (5 mg total) by mouth once as needed for migraine. May take again in 2 hours if needed. No more than 2 tabs in one day. (Patient not taking: Reported on 11/03/2016) 10 tablet 11  . methylPREDNISolone (MEDROL DOSEPAK) 4 MG TBPK tablet Take daily pills in the morning with food. 21 tablet 1  . ondansetron (ZOFRAN) 4 MG tablet Take 1 tablet (4 mg total) by mouth every 8 (eight) hours as needed for nausea or vomiting. 10 tablet 0   No facility-administered medications prior to visit.     PAST MEDICAL HISTORY: Past Medical History:  Diagnosis Date  . Anxiety   . Complication of anesthesia    AFTER BTL IN 2006, BP BECAME VERY ELEVATED-WITH EPIDURAL DURING DELIVERY, PT STARTED VOMITING AN SHAKING AND EPIDURAL HAD TO BE STOPPED  . Depression   . GERD (gastroesophageal reflux disease)   . Migraine   . Thyroid nodule     PAST SURGICAL HISTORY: Past Surgical History:  Procedure Laterality Date  . APPENDECTOMY    . COLONOSCOPY    . DILITATION & CURRETTAGE/HYSTROSCOPY WITH NOVASURE ABLATION N/A 07/17/2015   Procedure: DILATATION & CURETTAGE/HYSTEROSCOPY WITH NOVASURE ABLATION;  Surgeon: Aletha Halim, MD;  Location: [facility] ORS;  Service: Gynecology;  Laterality: N/A;  . LAPAROSCOPY ABDOMEN DIAGNOSTIC    . LASIK    . TUBAL LIGATION      FAMILY HISTORY: Family History  Problem Relation Age of Onset  . Hypertension Mother   . Hypertension Father   . Diabetes Father   . Migraines Father   . Migraines Brother   . Migraines Paternal Grandmother   . Migraines Paternal Grandfather     SOCIAL HISTORY: Social History   Social History  . Marital status: Widowed    Spouse name: N/A  . Number of children: 3  . Years of education: 10   Occupational History  . None    Social History Main Topics  . Smoking status: Current Every Day Smoker    Packs/day: 0.50    Years: 22.00    Types: Cigarettes  . Smokeless tobacco: Never Used  . Alcohol use  Yes     Comment: rarely  . Drug use: No  . Sexual activity: Not on file   Other Topics Concern  . Not on file   Social History Narrative   Drinks about 1-2 caffeine drinks a day      PHYSICAL EXAM  Vitals:   11/03/16 0938  BP: 106/72  Pulse: 86  Weight: 148 lb 6.4 oz (67.3 kg)  Height: 5\' 6"  (1.676 m)   Body mass index is 23.95 kg/m.  Generalized: Well developed, in no acute distress  Head: normocephalic and atraumatic,. Oropharynx benign  Musculoskeletal: No deformity   Neurological examination   Mentation: Alert oriented to time, place, history taking. Attention span and concentration appropriate. Recent and remote memory intact.  Follows all commands speech and language fluent.   Cranial nerve II-XII: Pupils were equal round reactive to light extraocular movements were full, visual field were full on confrontational test. Facial sensation and  strength were normal. hearing was intact to finger rubbing bilaterally. Uvula tongue midline. head turning and shoulder shrug were normal and symmetric.Tongue protrusion into cheek strength was normal. Motor: normal bulk and tone, full strength in the BUE, BLE, fine finger movements normal, no pronator drift. No focal weakness Sensory: normal and symmetric to light touch, pinprick, and  Vibration, In the upper and lower extremities Coordination: finger-nose-finger, heel-to-shin bilaterally, no dysmetria Reflexes: Symmetric upper and lower, plantar responses were flexor bilaterally. Gait and Station: Rising up from seated position without assistance, normal stance,  moderate stride, good arm swing, smooth turning, able to perform tiptoe, and heel walking without difficulty. Tandem gait is steady  DIAGNOSTIC DATA (LABS, IMAGING, TESTING) - I reviewed patient records, labs, notes, testing and imaging myself where available.  Lab Results  Component Value Date   WBC 9.1 08/21/2015   HGB 12.7 08/21/2015   HCT 38.1 08/21/2015   MCV  92.7 08/21/2015   PLT 195 08/21/2015      Component Value Date/Time   NA 136 08/20/2015 0005   K 3.5 08/20/2015 0005   CL 106 08/20/2015 0005   CO2 25 08/20/2015 0005   GLUCOSE 106 (H) 08/20/2015 0005   BUN 8 08/20/2015 0005   CREATININE 0.88 08/20/2015 0005   CALCIUM 8.6 (L) 08/20/2015 0005   PROT 6.9 08/20/2015 0005   ALBUMIN 3.9 08/20/2015 0005   AST 15 08/20/2015 0005   ALT 12 (L) 08/20/2015 0005   ALKPHOS 56 08/20/2015 0005   BILITOT 0.8 08/20/2015 0005   GFRNONAA >60 08/20/2015 0005   GFRAA >60 08/20/2015 0005    ASSESSMENT AND PLAN  41 y.o. year old female  has a past medical history of chronic migraine without aura, non-intractable. She has tried and failed first line and second line agents over the past 20 years and does not want to try any more preventive medications orally. She recently had a prednisone taper which was beneficial. Her PA for Zomig has been approved however [employer] has not placed that in the system her to be able to obtain from her pharmacy  PLAN: Will call [place] trax  [facility]ig has been PA [facility] Check with Andee Poles regarding Botox on the way out patient has appointment scheduled for 11/08/2016 at 4:30 in the afternoon Dr. Jaynee Eagles F/U with Dr. Jaynee Eagles for Botox Patient was advised to stop smoking Patient was advised to stay well-hydrated Dennie Bible, GNP, [facility], APRN  Addendum: Her fiance has noticed that she snores, grinds her teeth and sometimes grasps for air at night. She reports waking up every morning with a headache. Kelly Dorsey that a previous doctor had recommended she have a sleep study but she never had it done. Ordered sleep eval.  [facility] [address], [place], [place] [address] [phone]

## 2016-11-04 ENCOUNTER — Telehealth: Payer: Self-pay | Admitting: *Deleted

## 2016-11-04 NOTE — Telephone Encounter (Signed)
I called and spoke to [place] with [facility].  Shows a paid claim for generic zomig. Needed a over ride code.    Ref  ID [id number].  [facility] [address]  Pt picked up yesterday.

## 2016-11-08 ENCOUNTER — Ambulatory Visit (INDEPENDENT_AMBULATORY_CARE_PROVIDER_SITE_OTHER): Payer: Medicaid Other | Admitting: Neurology

## 2016-11-08 ENCOUNTER — Encounter: Payer: Self-pay | Admitting: Neurology

## 2016-11-08 VITALS — BP 138/90 | HR 108

## 2016-11-08 DIAGNOSIS — R519 Headache, unspecified: Secondary | ICD-10-CM

## 2016-11-08 DIAGNOSIS — G43701 Chronic migraine without aura, not intractable, with status migrainosus: Secondary | ICD-10-CM

## 2016-11-08 DIAGNOSIS — R0681 Apnea, not elsewhere classified: Secondary | ICD-10-CM

## 2016-11-08 DIAGNOSIS — R51 Headache: Secondary | ICD-10-CM

## 2016-11-08 DIAGNOSIS — R0683 Snoring: Secondary | ICD-10-CM

## 2016-11-08 NOTE — Progress Notes (Signed)
Consent Form Botulism Toxin Injection For Chronic Migraine  Botulism toxin has been approved by the Federal drug administration for treatment of chronic migraine. Botulism toxin does not cure chronic migraine and it may not be effective in some patients.  The administration of botulism toxin is accomplished by injecting a small amount of toxin into the muscles of the neck and head. Dosage must be titrated for each individual. Any benefits resulting from botulism toxin tend to wear off after 3 months with a repeat injection required if benefit is to be maintained. Injections are usually done every 3-4 months with maximum effect peak achieved by about 2 or 3 weeks. Botulism toxin is expensive and you should be sure of what costs you will incur resulting from the injection.  The side effects of botulism toxin use for chronic migraine may include:   -Transient, and usually mild, facial weakness with facial injections  -Transient, and usually mild, head or neck weakness with head/neck injections  -Reduction or loss of forehead facial animation due to forehead muscle              weakness  -Eyelid drooping  -Dry eye  -Pain at the site of injection or bruising at the site of injection  -Double vision  -Potential unknown long term risks  Contraindications: You should not have Botox if you are pregnant, nursing, allergic to albumin, have an infection, skin condition, or muscle weakness at the site of the injection, or have myasthenia gravis, Lambert-Eaton syndrome, or ALS.  It is also possible that as with any injection, there may be an allergic reaction or no effect from the medication. Reduced effectiveness after repeated injections is sometimes seen and rarely infection at the injection site may occur. All care will be taken to prevent these side effects. If therapy is given over a long time, atrophy and wasting in the muscle injected may occur. Occasionally the patient's become refractory to  treatment because they develop antibodies to the toxin. In this event, therapy needs to be modified.  I have read the above information and consent to the administration of botulism toxin.    ______________  _____   _________________  Patient signature     Date   Witness signature       BOTOX PROCEDURE NOTE FOR MIGRAINE HEADACHE    Contraindications and precautions discussed with patient(above). Aseptic procedure was observed and patient tolerated procedure. Procedure performed by Dr. Georgia Dom  The condition has existed for more than 6 months, and pt does not have a diagnosis of ALS, Myasthenia Gravis or Lambert-Eaton Syndrome. Risks and benefits of injections discussed and pt agrees to proceed with the procedure. Written consent obtained  These injections are medically necessary.  These injections do not cause sedations or hallucinations which the oral therapies may cause.  Indication/Diagnosis: chronic migraine BOTOX(J0585) injection was performed according to protocol by [employer]. 200 units of BOTOX was dissolved into 4 cc NS.  NDC: [id number]  Botox-100unitsx2 vials Lot: [id number] Expiration: 02/2019 NDC: [id number]  0.9% Sodium Chloride bacteriostatic- 31mL total Lot: [id number] Expiration: 03/24/2018 NDC: [id number]  Dx: [id number] Specialty pharmacy   Description of procedure:  The patient was placed in a sitting position. The standard protocol was used for Botox as follows, with 5 units of Botox injected at each site:   -Procerus muscle, midline injection  -Corrugator muscle, bilateral injection  -Frontalis muscle, bilateral injection, with 2 sites each side, medial injection was performed in the upper one third of the  frontalis muscle, in the region vertical from the medial inferior edge of the superior orbital rim. The lateral injection was again in the upper one third of the forehead vertically above the lateral limbus of the cornea, 1.5  cm lateral to the medial injection site.  -Temporalis muscle injection, 4 sites, bilaterally. The first injection was 3 cm above the tragus of the ear, second injection site was 1.5 cm to 3 cm up from the first injection site in line with the tragus of the ear. The third injection site was 1.5-3 cm forward between the first 2 injection sites. The fourth injection site was 1.5 cm posterior to the second injection site.  -Occipitalis muscle injection, 3 sites, bilaterally. The first injection was done one half way between the occipital protuberance and the tip of the mastoid process behind the ear. The second injection site was done lateral and superior to the first, 1 fingerbreadth from the first injection. The third injection site was 1 fingerbreadth superiorly and medially from the first injection site.  -Cervical paraspinal muscle injection, 2 sites, bilateral knee first injection site was 1 cm from the midline of the cervical spine, 3 cm inferior to the lower border of the occipital protuberance. The second injection site was 1.5 cm superiorly and laterally to the first injection site.  -Trapezius muscle injection was performed at 3 sites, bilaterally. The first injection site was in the upper trapezius muscle halfway between the inflection point of the neck, and the acromion. The second injection site was one half way between the acromion and the first injection site. The third injection was done between the first injection site and the inflection point of the neck.   Will return for repeat injection in 3 months.   A 200 unit sof Botox was used, 155 units were injected, the rest of the Botox was wasted. The patient tolerated the procedure well, there were no complications of the above procedure.

## 2016-11-08 NOTE — Progress Notes (Signed)
Botox-100unitsx2 vials Lot: [id number] Expiration: 02/2019 NDC: [id number]  0.9% Sodium Chloride bacteriostatic- 18mL total Lot: [id number] Expiration: 03/24/2018 NDC: [id number]  Dx: [id number] Specialty pharmacy

## 2016-11-13 NOTE — Progress Notes (Signed)
  Personally participated in and made any corrections needed to history, physical, neuro exam,assessment and plan as stated above and agree with plan.   Shaunda Tipping, MD 

## 2017-03-07 ENCOUNTER — Other Ambulatory Visit: Payer: Self-pay | Admitting: Neurology

## 2017-03-08 NOTE — Telephone Encounter (Signed)
Pt was last seen in Jan for Botox.

## 2017-03-31 ENCOUNTER — Telehealth: Payer: Self-pay | Admitting: Neurology

## 2017-03-31 ENCOUNTER — Ambulatory Visit (INDEPENDENT_AMBULATORY_CARE_PROVIDER_SITE_OTHER): Payer: Medicaid Other

## 2017-03-31 VITALS — BP 123/85 | HR 100 | Ht 66.0 in | Wt 153.0 lb

## 2017-03-31 DIAGNOSIS — G43711 Chronic migraine without aura, intractable, with status migrainosus: Secondary | ICD-10-CM

## 2017-03-31 MED ORDER — KETOROLAC TROMETHAMINE 60 MG/2ML IM SOLN
60.0000 mg | Freq: Once | INTRAMUSCULAR | Status: AC
  Administered 2017-03-31: 60 mg via INTRAMUSCULAR

## 2017-03-31 MED ORDER — DIHYDROERGOTAMINE MESYLATE 1 MG/ML IJ SOLN
0.5000 mg | Freq: Once | INTRAMUSCULAR | Status: AC
  Administered 2017-03-31: 0.5 mg via INTRAMUSCULAR

## 2017-03-31 NOTE — Progress Notes (Signed)
0910 - Toradol 60 mg/2 ml administered IM to L glut using aseptic technique. Pt tolerated well.Bandaid applied.   0920 - Lights dim. Pt resting. Water given. PO fluid intake encouraged.  0940 - Pt reports no improvement in migraine. DHE 1/2 ml administered to R deltoid using aseptic technique. Bandaid applied.  1000 - Pt continues to have symptoms. Taken to infusion suite by MD.

## 2017-03-31 NOTE — Telephone Encounter (Signed)
Pt called this morning said she is sitting in the parking lot with a terrible migraine. She is wanting a migraine cocktail. I skyped RN she asked me to check with infusion. Otila Kluver said she did not have availability until 71 today. Patient's daughter is graduating 5th grade at noon and cannot wait until 53. Rn was skyped, said maybe they could do an injection instead. Patient said ok, she will wait in her car for RN to call with the details of appt time and procedure. Thank you!

## 2017-03-31 NOTE — Telephone Encounter (Signed)
Provided toradol, DH and now in infusion suite thanks

## 2017-04-07 ENCOUNTER — Telehealth: Payer: Self-pay | Admitting: Neurology

## 2017-04-07 NOTE — Telephone Encounter (Signed)
Patient called office in reference to forgetting to mention at her visit with Dr. Jaynee Eagles about ordering a sleep study.  Please call

## 2017-04-07 NOTE — Telephone Encounter (Signed)
I'm not sure we ever discussed a sleep evaluation. If we have not, I can;t just order it I need clincial info on why she needs it. Ask her if she discussed this with me and if not she can f/u with NP so they can discuss with her thanks

## 2017-04-10 NOTE — Addendum Note (Signed)
Addended by: Sarina Ill B on: 04/10/2017 06:03 PM   Modules accepted: Orders

## 2017-04-10 NOTE — Telephone Encounter (Signed)
I will order a sleep evaluation and refer to Dr. Brett Fairy thanks

## 2017-04-10 NOTE — Telephone Encounter (Signed)
Called pt who reports that she's always had trouble sleeping at night. However, her migraines have gotten worse recently and she knows that both sleep apnea and migraines run in her family. Her fiance has noticed that she snores, grinds her teeth and sometimes grasps for air at night. She reports waking up every morning with a headache. [name] that a previous doctor had recommended she have a sleep study but she never had it done.

## 2017-05-16 ENCOUNTER — Institutional Professional Consult (permissible substitution): Payer: Medicaid Other | Admitting: Neurology

## 2017-05-16 ENCOUNTER — Encounter: Payer: Self-pay | Admitting: Neurology

## 2017-06-15 ENCOUNTER — Ambulatory Visit (INDEPENDENT_AMBULATORY_CARE_PROVIDER_SITE_OTHER): Payer: Medicaid Other | Admitting: Neurology

## 2017-06-15 ENCOUNTER — Encounter: Payer: Self-pay | Admitting: Neurology

## 2017-06-15 VITALS — BP 122/79 | HR 117 | Ht 66.0 in | Wt 153.0 lb

## 2017-06-15 DIAGNOSIS — R5383 Other fatigue: Secondary | ICD-10-CM | POA: Insufficient documentation

## 2017-06-15 DIAGNOSIS — G4710 Hypersomnia, unspecified: Secondary | ICD-10-CM | POA: Insufficient documentation

## 2017-06-15 DIAGNOSIS — G4730 Sleep apnea, unspecified: Secondary | ICD-10-CM | POA: Diagnosis not present

## 2017-06-15 DIAGNOSIS — R0683 Snoring: Secondary | ICD-10-CM | POA: Diagnosis not present

## 2017-06-15 DIAGNOSIS — Z72821 Inadequate sleep hygiene: Secondary | ICD-10-CM | POA: Diagnosis not present

## 2017-06-15 DIAGNOSIS — F5113 Hypersomnia due to other mental disorder: Secondary | ICD-10-CM | POA: Diagnosis not present

## 2017-06-15 DIAGNOSIS — G4763 Sleep related bruxism: Secondary | ICD-10-CM | POA: Insufficient documentation

## 2017-06-15 DIAGNOSIS — F0630 Mood disorder due to known physiological condition, unspecified: Secondary | ICD-10-CM

## 2017-06-15 DIAGNOSIS — G479 Sleep disorder, unspecified: Secondary | ICD-10-CM

## 2017-06-15 NOTE — Patient Instructions (Signed)

## 2017-06-15 NOTE — Progress Notes (Signed)
SLEEP MEDICINE CLINIC   Provider:  Larey Seat, M D  Primary Care Physician:  Leonard Downing, MD   Referring Provider: Sarina Ill. MD   Chief Complaint  Patient presents with  . New Patient (Initial Visit)    pt is with daughter, pt has been told apneic spells and snoring and grinding teeth.     HPI:  Kelly Dorsey is a 41 y.o. female , seen here as in a referral/ revisit  from Dr. Jaynee Eagles ,  Kelly Dorsey is seen here in the presence of her young daughter, she reports having chronic migraines which Dr. Jaynee Eagles has treated with Botox injections, but she has not found relief. The patient reports that she is snoring, loudly grinding her teeth at night, and that she has gone to bed with headaches and woken up with headaches, sometimes the headaches wake her from sleep. Her husband has witnessed apnea, she has tried multiple oral medications in the past. She carries a diagnosis of not just of migraine but also depression, anxiety, panic attacks, bipolar disorder and PTSD. Her surgeries include appendectomy, tubal ligation, cervical ablation. She is referred today to be evaluated for sleep study and to see if her bruxism as a form of periodic limb movement in sleep, as well as to rule out sleep apnea.  Sleep habits are as follows: The patient states that for the last hour before she goes to bed she is usually doing some homework, some housework and she watches TV. Her bedroom is quiet and dark. She prefers to sleep on the side, she sleeps on 3 pillows. Kelly Dorsey is widowed and lives with her fianc, shares a bedroom. Family members have noted her loud teeth grinding and her snoring. Her bedtime varies greatly, if she hasn't slept the night before she may be going to bed as early as 5 PM, but many nights she has great trouble falling asleep and staying asleep and her bedtime may be as late as 3, 4 or 5 AM. Sleep uninterrupted may last 3-4 hours, she does not have nocturia, and  she rises during school year at 6:30 AM. That also means that some nights she will only get 4 hours of sleep or even less. Headaches contributes to her difficulties falling asleep, staying asleep and also to the nonrestorative quality of her sleep. She does nap in daytime, she finds the sound and high-quality sleep after she has send her kids back to school and takes a nap in the morning hours. That seems to be the most restorative after 8 AM.   Sleep medical history and family sleep history: The patient's brother was a sleep walker, there is also apnea in the parents, paternal line.   Social history: she works not longer- since October 2017, due to migraine. 3 children ,45, 83 - 70 years old. Smoker, 1/2 ppd , was 1 ppd before. ETOH on occasion. Caffeine : coffee 1 mug in AM> iced tea and soda 2 a day. She used to work shifts.    Review of Systems: Out of a complete 14 system review, the patient complains of only the following symptoms, and all other reviewed systems are negative. The patient endorsed fatigue, chest pain, palpitations, hearing loss, ringing in her years, itching, shortness of breath at night sometimes choking sensations, headaches that interrupt her sleep, snoring, a tendency to bruise easily to feel hot and cold, muscle aching insomnia headaches numbness weakness and racing thoughts.   Epworth score 14 , Fatigue  severity score 63  , depression score n/a   Social History   Social History  . Marital status: Widowed    Spouse name: N/A  . Number of children: 3  . Years of education: 10   Occupational History  . None    Social History Main Topics  . Smoking status: Current Every Day Smoker    Packs/day: 0.50    Years: 22.00    Types: Cigarettes  . Smokeless tobacco: Never Used  . Alcohol use Yes     Comment: rarely  . Drug use: No  . Sexual activity: Not on file   Other Topics Concern  . Not on file   Social History Narrative   Drinks about 1-2 caffeine  drinks a day     Family History  Problem Relation Age of Onset  . Hypertension Mother   . Hypertension Father   . Diabetes Father   . Migraines Father   . Migraines Brother   . Migraines Paternal Grandmother   . Migraines Paternal Grandfather     Past Medical History:  Diagnosis Date  . Anxiety   . Complication of anesthesia    AFTER BTL IN 2006, BP BECAME VERY ELEVATED-WITH EPIDURAL DURING DELIVERY, PT STARTED VOMITING AN SHAKING AND EPIDURAL HAD TO BE STOPPED  . Depression   . GERD (gastroesophageal reflux disease)   . Migraine   . Thyroid nodule     Past Surgical History:  Procedure Laterality Date  . APPENDECTOMY    . COLONOSCOPY    . DILITATION & CURRETTAGE/HYSTROSCOPY WITH NOVASURE ABLATION N/A 07/17/2015   Procedure: DILATATION & CURETTAGE/HYSTEROSCOPY WITH NOVASURE ABLATION;  Surgeon: Aletha Halim, MD;  Location: [facility] ORS;  Service: Gynecology;  Laterality: N/A;  . LAPAROSCOPY ABDOMEN DIAGNOSTIC    . LASIK    . TUBAL LIGATION      Current Outpatient Prescriptions  Medication Sig Dispense Refill  . amphetamine-dextroamphetamine (ADDERALL) 10 MG tablet Take 10 mg by mouth daily with breakfast.    . botulinum toxin Type A (BOTOX) 100 units SOLR injection Inject 200units into head and neck muscles by provider every 3 months in the office 2 vial 3  . clonazePAM (KLONOPIN) 0.5 MG tablet Take 0.5 mg by mouth at bedtime.    . prochlorperazine (COMPAZINE) 10 MG tablet Take 1 tablet (10 mg total) by mouth every 6 (six) hours as needed for nausea or vomiting. 30 tablet 6  . rizatriptan (MAXALT) 10 MG tablet Take 10 mg by mouth as needed. For migraines. May repeat in 2 hours if needed    . topiramate (TOPAMAX) 25 MG tablet Take 25 mg by mouth 2 (two) times daily. Take 2 tab PO at bedtime and then a tablet 12 hrs later usually around lunch time.    Marland Kitchen zolmitriptan (ZOMIG) 5 MG tablet Take 1 tablet (5 mg total) by mouth once as needed for migraine. May take again in 2 hours if  needed. No more than 2 tabs in one day. 10 tablet 11   No current facility-administered medications for this visit.     Allergies as of 06/15/2017 - Review Complete 06/15/2017  Allergen Reaction Noted  . Other  07/09/2015    Vitals: BP 122/79   Pulse (!) 117   Ht 5\' 6"  (1.676 m)   Wt 153 lb (69.4 kg)   BMI 24.69 kg/m  Last Weight:  Wt Readings from Last 1 Encounters:  06/15/17 153 lb (69.4 kg)   EXN:TZGY mass index is 24.69 kg/m.  Last Height:   Ht Readings from Last 1 Encounters:  06/15/17 5\' 6"  (1.676 m)    Physical exam:  General: The patient is awake, alert and appears not in acute distress.  Head: Normocephalic, atraumatic. Neck is supple. Mallampati 3,  neck circumference:14.5 . Nasal airflow patent , bruxism marks.  Retrognathia is seen.  Cardiovascular:  Regular rate and rhythm , without  murmurs or carotid bruit, and without distended neck veins. Respiratory: Lungs are clear to auscultation. Skin:  Without evidence of edema, or rash, tattooed.  Trunk: BMI is 25. The patient's posture is erect   Neurologic exam : The patient is awake and alert, oriented to place and time.   Memory subjective described as intact.    Cranial nerves: Pupils are equal and briskly reactive to light.  Extraocular movements  in vertical and horizontal planes intact and without nystagmus. Visual fields by finger perimetry are intact. Hearing to finger rub intact.  Facial sensation intact to fine touch. Facial motor strength is symmetric and tongue and uvula move midline. Shoulder shrug was symmetrical.   Motor exam:   Normal tone, muscle bulk and symmetric strength in all extremities. Sensory:  Fine touch, pinprick and vibration were tested in all extremities. Proprioception tested in the upper extremities was normal. Coordination: Rapid alternating movements in the fingers/hands was normal. Finger-to-nose maneuver  normal without evidence of ataxia, dysmetria or tremor. Gait and  station: Patient walks without assistive device .Tandem gait is unfragmented. Turns with 3 Steps.  Deep tendon reflexes: in the  upper and lower extremities are symmetric and intact.   Assessment:  After physical and neurologic examination, review of laboratory studies,  Personal review of imaging studies, reports of other /same  Imaging studies, results of polysomnography and / or neurophysiology testing and pre-existing records as far as provided in visit., my assessment is   1) Kelly Dorsey has been observed and witnessed to snore, to have loud bruxism, frequent fragmentation and sleep, tossing and turning and witnessed apneas. It is possible that sleep apnea contributes to the teeth grinding as well as to the morning headaches or nocturnal headaches. I will order a sleep study for her to screen her for the presence of sleep apnea. I will meet with her after the test is completed I would like to include capnography.    The patient was advised of the nature of the diagnosed disorder , the treatment options and the  risks for general health and wellness arising from not treating the condition.   I spent more than 35 minutes of face to face time with the patient.  Greater than 50% of time was spent in counseling and coordination of care. We have discussed the diagnosis and differential and I answered the patient's questions.    Plan:  Treatment plan and additional workup : SPLIT night with CO2.  Larey Seat, MD 1/44/8185, 6:31 PM  Certified in Neurology by ABPN Certified in [place] by [facility] [address], [place], [place] [address]

## 2017-07-28 ENCOUNTER — Other Ambulatory Visit: Payer: Self-pay | Admitting: Oral Surgery

## 2017-07-28 DIAGNOSIS — R5381 Other malaise: Secondary | ICD-10-CM

## 2017-07-31 ENCOUNTER — Ambulatory Visit (INDEPENDENT_AMBULATORY_CARE_PROVIDER_SITE_OTHER): Payer: Medicaid Other | Admitting: Neurology

## 2017-07-31 ENCOUNTER — Other Ambulatory Visit: Payer: Self-pay | Admitting: Oral Surgery

## 2017-07-31 DIAGNOSIS — G4730 Sleep apnea, unspecified: Secondary | ICD-10-CM | POA: Diagnosis not present

## 2017-07-31 DIAGNOSIS — Z72821 Inadequate sleep hygiene: Secondary | ICD-10-CM

## 2017-07-31 DIAGNOSIS — G4710 Hypersomnia, unspecified: Secondary | ICD-10-CM | POA: Diagnosis not present

## 2017-07-31 DIAGNOSIS — F5113 Hypersomnia due to other mental disorder: Secondary | ICD-10-CM

## 2017-07-31 DIAGNOSIS — G479 Sleep disorder, unspecified: Secondary | ICD-10-CM

## 2017-07-31 DIAGNOSIS — F0630 Mood disorder due to known physiological condition, unspecified: Secondary | ICD-10-CM

## 2017-07-31 DIAGNOSIS — K0826 Severe atrophy of the maxilla: Secondary | ICD-10-CM

## 2017-08-02 ENCOUNTER — Ambulatory Visit
Admission: RE | Admit: 2017-08-02 | Discharge: 2017-08-02 | Disposition: A | Discharge status: Home / Self Care | Payer: Medicaid Other | Source: Ambulatory Visit | Attending: Oral Surgery | Admitting: Oral Surgery

## 2017-08-02 DIAGNOSIS — K0826 Severe atrophy of the maxilla: Secondary | ICD-10-CM

## 2017-08-02 MED ORDER — IOPAMIDOL (ISOVUE-300) INJECTION 61%
75.0000 mL | Freq: Once | INTRAVENOUS | Status: AC | PRN
  Administered 2017-08-02: 75 mL via INTRAVENOUS

## 2017-08-08 ENCOUNTER — Telehealth: Payer: Self-pay | Admitting: Neurology

## 2017-08-08 NOTE — Procedures (Signed)
PATIENT'S NAME:  Kelly Dorsey, Kelly Dorsey DOB:      July 19, 1976      MR#:    [id number]     DATE OF RECORDING: 07/31/2017 REFERRING M.D.:  Sarina Ill, MD Leonard Downing, MD Study Performed:   Baseline Polysomnogram HISTORY:  41 y.o. female patient, seen here as in a referral from Dr. Jaynee Eagles.  She reports having chronic migraines which Dr. Jaynee Eagles has treated with Botox injections, but she has not found relief. The patient reports that she is snoring, loudly grinding her teeth at night, and that she has gone to bed with headaches and woken up with headaches, sometimes the headaches wake her from sleep.  Her husband has witnessed apnea, she has tried multiple oral medications in the past. She carries a diagnosis of depression, anxiety, panic attacks, bipolar disorder and PTSD.  She is referred today to be evaluated for sleep study and to see if her bruxism as a form of periodic limb movement in sleep, as well as to rule out sleep apnea.  The patient endorsed the Epworth Sleepiness Scale at 14/24 points.  The patient's weight 153 pounds with a height of 66 (inches), resulting in a BMI of 24.4 kg/m2.The patient's neck circumference measured 14.5 inches.  CURRENT MEDICATIONS: Adderall; Botox; Compazine; Maxalt; Topamax; Zomig   PROCEDURE:  This is a multichannel digital polysomnogram utilizing the Somnostar 11.2 system.  Electrodes and sensors were applied and monitored per AASM Specifications.   EEG, EOG, Chin and Limb EMG, were sampled at 200 Hz.  ECG, Snore and Nasal Pressure, Thermal Airflow, Respiratory Effort, CPAP Flow and Pressure, Oximetry was sampled at 50 Hz. Digital video and audio were recorded.      BASELINE STUDY: Lights Out was at 22:16 and Lights On at 05:24.  Total recording time (TRT) was 428.5 minutes, with a total sleep time (TST) of 399 minutes.   The patient's sleep latency was 19 minutes.  REM latency was 76.5 minutes.  The sleep efficiency was 93.1 %.     SLEEP ARCHITECTURE: WASO  (Wake after sleep onset) was 10.5 minutes.  There were 5 minutes in Stage N1, 257 minutes Stage N2, 38 minutes Stage N3 and 99 minutes in Stage REM.  The percentage of Stage N1 was 1.3%, Stage N2 was 64.4%, Stage N3 was 9.5% and Stage R (REM sleep) was 24.8%.  RESPIRATORY ANALYSIS:  There were a total of 8 respiratory events:  0 apneas and 8 hypopneas with 0 respiratory event related arousals (RERAs).   The total APNEA/HYPOPNEA INDEX (AHI) was 1.2/hour and the total RESPIRATORY DISTURBANCE INDEX was 1.2 /hour.  5 events occurred in REM sleep and 6 events in NREM. The REM AHI was 3.0 /hour, versus a non-REM AHI of 0.6. The patient spent 91 minutes of total sleep time in the supine position and 308 minutes in non-supine. The supine AHI was 5.3 versus a non-supine AHI of 0.0.  OXYGEN SATURATION & C02:  The Wake baseline 02 saturation was 98%, with the lowest being 92%. Time spent below 89% saturation equaled 0 minutes. Average End Tidal CO2 during sleep was 35.1 torr.  During REM, the average End Tidal CO2 during sleep was 36.4 torr.  During NREM, the average End Tidal CO2 during sleep was 34.6 torr.  Total sleep time greater than 40 torr was 0.90 minutes.   PERIODIC LIMB MOVEMENTS:   The patient had a total of 76 Periodic Limb Movements.  The Periodic Limb Movement (PLM) index was 11.4 and the  PLM Arousal index was 1.1/hour. The arousals were noted as: 37 were spontaneous, 7 were associated with PLMs, and 8 were associated with respiratory events.  Audio and video analysis did not show any abnormal or unusual movements, behaviors, phonations or vocalizations.  No nocturia. Moderate Snoring was noted. EKG was in keeping with normal sinus rhythm and intermittent sinus tachycardia (NSR).  IMPRESSION:  1. No evidence of Obstructive Sleep Apnea(OSA) 2. Mild Periodic Limb Movement Disorder (PLMD) 3. Primary Snoring, Mild to Moderate - this can be treated by weight loss, avoiding to sleep supine and in some  cases by a dental device that can also protect teeth from Bruxism. Bruxism was not documented during this sleep study.   4. High sleep efficiency - no insomnia.  RECOMMENDATIONS:  1. A follow up appointment with the Sleep Clinic at [facility] is not needed. The referring provider will be notified of the results.  I will be happy to share the results with the patient's dentist if she would like a dental device for snoring and bruxism treatment.    I certify that I have reviewed the entire raw data recording prior to the issuance of this report in accordance with the Standards of Accreditation of the American Academy of Sleep Medicine (AASM)      Larey Seat, MD      08-08-2017  Diplomat, American Board of Psychiatry and Neurology  Diplomat, American Board of Gerty Director, [employer] [employer]

## 2017-08-08 NOTE — Telephone Encounter (Signed)
Called the patient and made the patient aware of sleep study results. Pt was concerned she said she slept really well  That night which Is different from a normal nights sleep for her. She was concerned about the fact that during her wisdom teeth procedure they witnessed she would stop breathing. I informed her that could have been related to the medication that she was taking that had her asleep for the procedure. Those medications can induce a abnormal breathing pattern. I informed the patient I would make Dr. Brett Fairy aware of these concerns. At this time I have explained the patient can reach out to the dentist about setting her up with a dental device to help with her snoring. Pt verbalized understanding.

## 2017-08-08 NOTE — Telephone Encounter (Signed)
-----   Message from Larey Seat, MD sent at 08/08/2017  4:44 PM EDT ----- This patient had excellent sleep- normal sleep architecture, no insomnia, , no apnea- but some snoring. I recommend to speak to her dentist, Dr Diona Browner, DDS about a mandibular snoring device that can protect her from teeth grinding .CD

## 2017-08-14 ENCOUNTER — Other Ambulatory Visit: Payer: Self-pay | Admitting: Nurse Practitioner

## 2017-08-14 DIAGNOSIS — Z139 Encounter for screening, unspecified: Secondary | ICD-10-CM

## 2017-08-24 ENCOUNTER — Ambulatory Visit
Admission: RE | Admit: 2017-08-24 | Discharge: 2017-08-24 | Disposition: A | Discharge status: Home / Self Care | Payer: Medicaid Other | Source: Ambulatory Visit | Attending: Nurse Practitioner | Admitting: Nurse Practitioner

## 2017-08-24 DIAGNOSIS — Z1231 Encounter for screening mammogram for malignant neoplasm of breast: Secondary | ICD-10-CM | POA: Diagnosis present

## 2017-08-24 DIAGNOSIS — Z139 Encounter for screening, unspecified: Secondary | ICD-10-CM

## 2017-08-24 DIAGNOSIS — R928 Other abnormal and inconclusive findings on diagnostic imaging of breast: Secondary | ICD-10-CM | POA: Insufficient documentation

## 2017-08-29 ENCOUNTER — Other Ambulatory Visit: Payer: Self-pay | Admitting: Nurse Practitioner

## 2017-08-29 DIAGNOSIS — R928 Other abnormal and inconclusive findings on diagnostic imaging of breast: Secondary | ICD-10-CM

## 2017-09-05 ENCOUNTER — Ambulatory Visit
Admission: RE | Admit: 2017-09-05 | Discharge: 2017-09-05 | Disposition: A | Discharge status: Home / Self Care | Payer: Medicaid Other | Source: Ambulatory Visit | Attending: Nurse Practitioner | Admitting: Nurse Practitioner

## 2017-09-05 ENCOUNTER — Other Ambulatory Visit: Payer: Self-pay | Admitting: Nurse Practitioner

## 2017-09-05 DIAGNOSIS — N6322 Unspecified lump in the left breast, upper inner quadrant: Secondary | ICD-10-CM | POA: Insufficient documentation

## 2017-09-05 DIAGNOSIS — N6489 Other specified disorders of breast: Secondary | ICD-10-CM | POA: Diagnosis present

## 2017-09-05 DIAGNOSIS — N6320 Unspecified lump in the left breast, unspecified quadrant: Secondary | ICD-10-CM

## 2017-09-05 DIAGNOSIS — R928 Other abnormal and inconclusive findings on diagnostic imaging of breast: Secondary | ICD-10-CM

## 2017-09-11 ENCOUNTER — Other Ambulatory Visit: Payer: Self-pay | Admitting: Nurse Practitioner

## 2017-09-11 DIAGNOSIS — N6320 Unspecified lump in the left breast, unspecified quadrant: Secondary | ICD-10-CM

## 2017-09-11 DIAGNOSIS — R928 Other abnormal and inconclusive findings on diagnostic imaging of breast: Secondary | ICD-10-CM

## 2017-09-12 ENCOUNTER — Other Ambulatory Visit: Payer: Self-pay | Admitting: Neurology

## 2017-09-18 ENCOUNTER — Ambulatory Visit
Admission: RE | Admit: 2017-09-18 | Discharge: 2017-09-18 | Disposition: A | Discharge status: Home / Self Care | Payer: Medicaid Other | Source: Ambulatory Visit | Attending: Nurse Practitioner | Admitting: Nurse Practitioner

## 2017-09-18 DIAGNOSIS — N6320 Unspecified lump in the left breast, unspecified quadrant: Secondary | ICD-10-CM

## 2017-09-18 DIAGNOSIS — R928 Other abnormal and inconclusive findings on diagnostic imaging of breast: Secondary | ICD-10-CM

## 2017-09-18 DIAGNOSIS — N6322 Unspecified lump in the left breast, upper inner quadrant: Secondary | ICD-10-CM | POA: Insufficient documentation

## 2017-09-18 HISTORY — PX: BREAST BIOPSY: SHX20

## 2017-09-19 LAB — SURGICAL PATHOLOGY

## 2017-09-29 ENCOUNTER — Other Ambulatory Visit: Payer: Self-pay | Admitting: Surgery

## 2017-09-29 DIAGNOSIS — N63 Unspecified lump in unspecified breast: Secondary | ICD-10-CM

## 2017-10-04 ENCOUNTER — Encounter
Admission: RE | Admit: 2017-10-04 | Discharge: 2017-10-04 | Disposition: A | Discharge status: Home / Self Care | Payer: Medicaid Other | Source: Ambulatory Visit | Attending: Surgery | Admitting: Surgery

## 2017-10-04 ENCOUNTER — Other Ambulatory Visit: Payer: Self-pay

## 2017-10-04 NOTE — Patient Instructions (Addendum)
  Your procedure is scheduled on: 10-06-17 Report to [facility] @ 7:45 AM   Remember: Instructions that are not followed completely may result in serious medical risk, up to and including death, or upon the discretion of your surgeon and anesthesiologist your surgery may need to be rescheduled.    _x___ 1. Do not eat food after midnight the night before your procedure. NO GUM OR CANDY AFTER MIDNIGHT.  You may drink clear liquids up to 2 hours before you are scheduled to arrive at the hospital for your procedure.  Do not drink clear liquids within 2 hours of your scheduled arrival to the hospital.  Clear liquids include  --Water or Apple juice without pulp  --Clear carbohydrate beverage such as ClearFast or Gatorade  --Black Coffee or Clear Tea (No milk, no creamers, do not add anything to  the coffee or Tea       __x__ 2. No Alcohol for 24 hours before or after surgery.   __x__3. No Smoking for 24 prior to surgery.   ____  4. Bring all medications with you on the day of surgery if instructed.    __x__ 5. Notify your doctor if there is any change in your medical condition     (cold, fever, infections).     Do not wear jewelry, make-up, hairpins, clips or nail polish.  Do not wear lotions, powders, or perfumes. You may wear deodorant.  Do not shave 48 hours prior to surgery. Men may shave face and neck.  Do not bring valuables to the hospital.    [facility] is not responsible for any belongings or valuables.               Contacts, dentures or bridgework may not be worn into surgery.  Leave your suitcase in the car. After surgery it may be brought to your room.  For patients admitted to the hospital, discharge time is determined by your treatment team.   Patients discharged the day of surgery will not be allowed to drive home.  You will need someone to drive you home and stay with you the night of your procedure.     _x___ TAKE THE FOLLOWING MEDICATION THE MORNING OF  SURGERY WITH SMALL SIP OF WATER. These include:  1. TOPAMAX  2.  3.  4.  5.  6.  ____Fleets enema or Magnesium Citrate as directed.   ____ Use CHG Soap or sage wipes as directed on instruction sheet   ____ Use inhalers on the day of surgery and bring to hospital day of surgery  ____ Stop Metformin and Janumet 2 days prior to surgery.    ____ Take 1/2 of usual insulin dose the night before surgery and none on the morning surgery.   ____ Follow recommendations from Cardiologist, Pulmonologist or PCP regarding stopping Aspirin, Coumadin, Plavix ,Eliquis, Effient, or Pradaxa, and Pletal.  X____Stop Anti-inflammatories such as Advil, Aleve, Ibuprofen, Motrin, Naproxen, Naprosyn, Goodies powders or aspirin products NOW-OK to take Tylenol   ____ Stop supplements until after surgery.    ____ Bring C-Pap to the hospital.

## 2017-10-06 ENCOUNTER — Encounter
Admission: RE | Disposition: A | Discharge status: Home / Self Care | Payer: Self-pay | Source: Ambulatory Visit | Attending: Surgery

## 2017-10-06 ENCOUNTER — Ambulatory Visit
Admission: RE | Admit: 2017-10-06 | Discharge: 2017-10-06 | Disposition: A | Discharge status: Home / Self Care | Payer: Medicaid Other | Source: Ambulatory Visit | Attending: Surgery | Admitting: Surgery

## 2017-10-06 ENCOUNTER — Ambulatory Visit: Payer: Medicaid Other | Admitting: Anesthesiology

## 2017-10-06 ENCOUNTER — Encounter: Payer: Self-pay | Admitting: *Deleted

## 2017-10-06 DIAGNOSIS — G43909 Migraine, unspecified, not intractable, without status migrainosus: Secondary | ICD-10-CM | POA: Diagnosis not present

## 2017-10-06 DIAGNOSIS — F988 Other specified behavioral and emotional disorders with onset usually occurring in childhood and adolescence: Secondary | ICD-10-CM | POA: Insufficient documentation

## 2017-10-06 DIAGNOSIS — D242 Benign neoplasm of left breast: Secondary | ICD-10-CM | POA: Diagnosis not present

## 2017-10-06 DIAGNOSIS — N6322 Unspecified lump in the left breast, upper inner quadrant: Secondary | ICD-10-CM | POA: Diagnosis present

## 2017-10-06 DIAGNOSIS — F17200 Nicotine dependence, unspecified, uncomplicated: Secondary | ICD-10-CM | POA: Insufficient documentation

## 2017-10-06 DIAGNOSIS — F419 Anxiety disorder, unspecified: Secondary | ICD-10-CM | POA: Insufficient documentation

## 2017-10-06 DIAGNOSIS — Z79899 Other long term (current) drug therapy: Secondary | ICD-10-CM | POA: Diagnosis not present

## 2017-10-06 DIAGNOSIS — F329 Major depressive disorder, single episode, unspecified: Secondary | ICD-10-CM | POA: Insufficient documentation

## 2017-10-06 DIAGNOSIS — Z7952 Long term (current) use of systemic steroids: Secondary | ICD-10-CM | POA: Insufficient documentation

## 2017-10-06 DIAGNOSIS — N63 Unspecified lump in unspecified breast: Secondary | ICD-10-CM

## 2017-10-06 HISTORY — PX: BREAST LUMPECTOMY WITH NEEDLE LOCALIZATION: SHX5759

## 2017-10-06 HISTORY — PX: BREAST EXCISIONAL BIOPSY: SUR124

## 2017-10-06 LAB — POCT PREGNANCY, URINE: PREG TEST UR: NEGATIVE

## 2017-10-06 SURGERY — BREAST LUMPECTOMY WITH NEEDLE LOCALIZATION
Anesthesia: General | Site: Breast | Laterality: Left | Wound class: Clean

## 2017-10-06 MED ORDER — FENTANYL CITRATE (PF) 100 MCG/2ML IJ SOLN
INTRAMUSCULAR | Status: DC | PRN
  Administered 2017-10-06: 100 ug via INTRAVENOUS
  Administered 2017-10-06: 50 ug via INTRAVENOUS

## 2017-10-06 MED ORDER — DEXAMETHASONE SODIUM PHOSPHATE 10 MG/ML IJ SOLN
INTRAMUSCULAR | Status: AC
  Filled 2017-10-06: qty 1

## 2017-10-06 MED ORDER — FENTANYL CITRATE (PF) 100 MCG/2ML IJ SOLN
INTRAMUSCULAR | Status: AC
  Filled 2017-10-06: qty 2

## 2017-10-06 MED ORDER — BUPIVACAINE-EPINEPHRINE (PF) 0.5% -1:200000 IJ SOLN
INTRAMUSCULAR | Status: AC
  Filled 2017-10-06: qty 30

## 2017-10-06 MED ORDER — EPHEDRINE SULFATE 50 MG/ML IJ SOLN
INTRAMUSCULAR | Status: DC | PRN
  Administered 2017-10-06 (×2): 10 mg via INTRAVENOUS
  Administered 2017-10-06 (×2): 5 mg via INTRAVENOUS

## 2017-10-06 MED ORDER — LACTATED RINGERS IV SOLN
INTRAVENOUS | Status: DC
  Administered 2017-10-06: 09:00:00 via INTRAVENOUS

## 2017-10-06 MED ORDER — BUPIVACAINE-EPINEPHRINE 0.5% -1:200000 IJ SOLN
INTRAMUSCULAR | Status: DC | PRN
  Administered 2017-10-06: 14 mL

## 2017-10-06 MED ORDER — PROMETHAZINE HCL 25 MG/ML IJ SOLN: 6.2500 mg | INTRAMUSCULAR | Status: DC | PRN

## 2017-10-06 MED ORDER — PROPOFOL 10 MG/ML IV BOLUS
INTRAVENOUS | Status: DC | PRN
  Administered 2017-10-06: 50 mg via INTRAVENOUS
  Administered 2017-10-06: 150 mg via INTRAVENOUS

## 2017-10-06 MED ORDER — FENTANYL CITRATE (PF) 100 MCG/2ML IJ SOLN: 25.0000 ug | INTRAMUSCULAR | Status: DC | PRN

## 2017-10-06 MED ORDER — MIDAZOLAM HCL 2 MG/2ML IJ SOLN
INTRAMUSCULAR | Status: AC
  Filled 2017-10-06: qty 2

## 2017-10-06 MED ORDER — OXYCODONE HCL 5 MG/5ML PO SOLN: 5.0000 mg | Freq: Once | ORAL | Status: AC | PRN

## 2017-10-06 MED ORDER — FENTANYL CITRATE (PF) 100 MCG/2ML IJ SOLN
INTRAMUSCULAR | Status: AC
Start: 2017-10-06 — End: 2017-10-06
  Filled 2017-10-06: qty 2

## 2017-10-06 MED ORDER — DEXAMETHASONE SODIUM PHOSPHATE 10 MG/ML IJ SOLN
INTRAMUSCULAR | Status: DC | PRN
  Administered 2017-10-06: 10 mg via INTRAVENOUS

## 2017-10-06 MED ORDER — OXYCODONE HCL 5 MG PO TABS
ORAL_TABLET | ORAL | Status: AC
  Filled 2017-10-06: qty 1

## 2017-10-06 MED ORDER — OXYCODONE HCL 5 MG PO TABS
5.0000 mg | ORAL_TABLET | Freq: Once | ORAL | Status: AC | PRN
Start: 2017-10-06 — End: 2017-10-06
  Administered 2017-10-06: 5 mg via ORAL

## 2017-10-06 MED ORDER — LIDOCAINE HCL (PF) 2 % IJ SOLN
INTRAMUSCULAR | Status: AC
  Filled 2017-10-06: qty 10

## 2017-10-06 MED ORDER — OXYCODONE-ACETAMINOPHEN 5-325 MG PO TABS: 1.0000 | ORAL_TABLET | ORAL | 0 refills | Status: DC | PRN

## 2017-10-06 MED ORDER — PROPOFOL 10 MG/ML IV BOLUS
INTRAVENOUS | Status: AC
  Filled 2017-10-06: qty 20

## 2017-10-06 MED ORDER — ONDANSETRON HCL 4 MG/2ML IJ SOLN
INTRAMUSCULAR | Status: DC | PRN
  Administered 2017-10-06: 4 mg via INTRAVENOUS

## 2017-10-06 MED ORDER — LIDOCAINE HCL (CARDIAC) 20 MG/ML IV SOLN
INTRAVENOUS | Status: DC | PRN
  Administered 2017-10-06: 100 mg via INTRAVENOUS

## 2017-10-06 MED ORDER — FAMOTIDINE 20 MG PO TABS
20.0000 mg | ORAL_TABLET | Freq: Once | ORAL | Status: AC
  Administered 2017-10-06: 20 mg via ORAL

## 2017-10-06 MED ORDER — ONDANSETRON HCL 4 MG/2ML IJ SOLN
INTRAMUSCULAR | Status: AC
  Filled 2017-10-06: qty 2

## 2017-10-06 MED ORDER — FAMOTIDINE 20 MG PO TABS
ORAL_TABLET | ORAL | Status: AC
  Administered 2017-10-06: 20 mg via ORAL
  Filled 2017-10-06: qty 1

## 2017-10-06 MED ORDER — MEPERIDINE HCL 50 MG/ML IJ SOLN: 6.2500 mg | INTRAMUSCULAR | Status: DC | PRN

## 2017-10-06 SURGICAL SUPPLY — 29 items
ADH SKN CLS APL DERMABOND .7 (GAUZE/BANDAGES/DRESSINGS) ×1
BLADE SURG 15 STRL LF DISP TIS (BLADE) ×1 IMPLANT
BLADE SURG 15 STRL SS (BLADE) ×3
CANISTER SUCT 1200ML W/VALVE (MISCELLANEOUS) ×3 IMPLANT
CHLORAPREP W/TINT 26ML (MISCELLANEOUS) ×5 IMPLANT
DERMABOND ADVANCED (GAUZE/BANDAGES/DRESSINGS) ×2
DERMABOND ADVANCED .7 DNX12 (GAUZE/BANDAGES/DRESSINGS) ×1 IMPLANT
DEVICE DUBIN SPECIMEN MAMMOGRA (MISCELLANEOUS) ×3 IMPLANT
DRAPE LAPAROTOMY 77X122 PED (DRAPES) ×3 IMPLANT
ELECT REM PT RETURN 9FT ADLT (ELECTROSURGICAL) ×3
ELECTRODE REM PT RTRN 9FT ADLT (ELECTROSURGICAL) ×1 IMPLANT
GLOVE BIO SURGEON STRL SZ7.5 (GLOVE) ×5 IMPLANT
GOWN STRL REUS W/ TWL LRG LVL3 (GOWN DISPOSABLE) ×2 IMPLANT
GOWN STRL REUS W/TWL LRG LVL3 (GOWN DISPOSABLE) ×9
KIT RM TURNOVER STRD PROC AR (KITS) ×3 IMPLANT
LABEL OR SOLS (LABEL) ×3 IMPLANT
MARGIN MAP 10MM (MISCELLANEOUS) ×3 IMPLANT
NDL HYPO 25X1 1.5 SAFETY (NEEDLE) ×1 IMPLANT
NEEDLE HYPO 25X1 1.5 SAFETY (NEEDLE) ×3 IMPLANT
PACK BASIN MINOR ARMC (MISCELLANEOUS) ×3 IMPLANT
SUT CHROMIC 3 0 SH 27 (SUTURE) ×2 IMPLANT
SUT CHROMIC 4 0 RB 1X27 (SUTURE) ×1 IMPLANT
SUT ETHILON 3-0 FS-10 30 BLK (SUTURE) ×3
SUT MNCRL 4-0 (SUTURE) ×3
SUT MNCRL 4-0 27XMFL (SUTURE) ×1
SUTURE EHLN 3-0 FS-10 30 BLK (SUTURE) ×1 IMPLANT
SUTURE MNCRL 4-0 27XMF (SUTURE) ×1 IMPLANT
SYR 10ML LL (SYRINGE) ×3 IMPLANT
WATER STERILE IRR 1000ML POUR (IV SOLUTION) ×3 IMPLANT

## 2017-10-06 NOTE — Op Note (Signed)
OPERATIVE REPORT  PREOPERATIVE  DIAGNOSIS: .  Left breast mass  POSTOPERATIVE DIAGNOSIS: .  Left breast mass  PROCEDURE: .  Excision left breast mass  ANESTHESIA:  General  SURGEON: Rochel Brome  MD   INDICATIONS: .  She had recent mammogram depicting a small density in the upper inner quadrant of the left breast.  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  Excision was recommended for further evaluation and treatment.  She did have preoperative x-ray needle localization.  Mammogram images were reviewed.  With the patient on the operating table in the supine position left breast exposing the Kopan's wire in the upper inner quadrant.  The wire was cut 2 cm from the skin.  The left arm was placed on the lateral arm support.  The left breast and surrounding chest wall were prepared with ChloraPrep and draped in a sterile manner.  A curvilinear incision was made in the upper inner quadrant of the left breast 4 cm from the nipple just outside the border of the areola.  Dissection was carried down to encounter the wire.  Next a mass of tissue was excised surrounding approximately 4 x 4 x 4 cm in dimension.  As this was being dissected sutures were used to attach markers to label the cranial caudal medial lateral superficial and deep margins.  The mass was completely resected with surrounding tissue and submitted for specimen mammogram and pathology.  The specimen mammogram demonstrated the central location of the biopsy marker.  The pathologist called to report the biopsy marker was found and as of yet did not yet identify the tumor however will process for routine pathology..  The wound was inspected and several small bleeding points were cauterized.  Hemostasis was subsequently intact.  Subcuticular tissues were infiltrated with half percent Sensorcaine with epinephrine.  Also deeper tissues surrounding cautery artifact were infiltrated.   The subcutaneous tissues were approximated with 3-0 chromic.  The skin was closed with running 4-0 Monocryl subcuticular suture and Dermabond.  The patient tolerated surgery satisfactorily and was then prepared for transfer to the recovery room  Rochel Brome MD

## 2017-10-06 NOTE — Discharge Instructions (Addendum)
AMBULATORY SURGERY  DISCHARGE INSTRUCTIONS   1) The drugs that you were given will stay in your system until tomorrow so for the next 24 hours you should not:  A) Drive an automobile B) Make any legal decisions C) Drink any alcoholic beverage   2) You may resume regular meals tomorrow.  Today it is better to start with liquids and gradually work up to solid foods.  You may eat anything you prefer, but it is better to start with liquids, then soup and crackers, and gradually work up to solid foods.   3) Please notify your doctor immediately if you have any unusual bleeding, trouble breathing, redness and pain at the surgery site, drainage, fever, or pain not relieved by medication.    4) Additional Instructions: Take Tylenol or Percocet if needed for pain. 5) Should not drive or do anything dangerous when taking Percocet. 6) May shower and blot dry. 7) Wear bra as desired for comfort and support.  You may take a shower at any time with the super glue over your incision.  Do not pull the glue off, wait until it comes up. No using the left arm for lifting or vacuuming, etc. Do not carry a pocket book on the left side.      Please contact your physician with any problems or Same Day Surgery at [phone], Monday through Friday 6 am to 4 pm, or [place] at [facility] number at [phone].

## 2017-10-06 NOTE — Transfer of Care (Signed)
Immediate Anesthesia Transfer of Care Note  Patient: Kelly Dorsey  Procedure(s) Performed: BREAST LUMPECTOMY WITH NEEDLE LOCALIZATION (Left Breast)  Patient Location: PACU  Anesthesia Type:General  Level of Consciousness: awake, alert  and responds to stimulation  Airway & Oxygen Therapy: Patient Spontanous Breathing and Patient connected to face mask oxygen  Post-op Assessment: Report given to RN and Post -op Vital signs reviewed and stable  Post vital signs: Reviewed and stable  Last Vitals:  Vitals:   10/06/17 0857 10/06/17 1220  BP: 119/80 118/78  Pulse:  (!) 110  Resp: 20 (!) 22  Temp: (!) 35.9 C   SpO2: 100% 95%    Last Pain:  Vitals:   10/06/17 0857  TempSrc: Tympanic         Complications: No apparent anesthesia complications

## 2017-10-06 NOTE — H&P (Signed)
  She comes in today for excision of a left breast mass.  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  She reports no change in overall condition since the recent office visit although she feels she may have a slight headache this morning.  I discussed the plan for excision of left breast mass.  The left side was marked YES.

## 2017-10-06 NOTE — Anesthesia Procedure Notes (Signed)
Procedure Name: LMA Insertion Date/Time: 10/06/2017 11:09 AM Performed by: Nelda Marseille, CRNA Pre-anesthesia Checklist: Patient identified, Patient being monitored, Timeout performed, Emergency Drugs available and Suction available Patient Re-evaluated:Patient Re-evaluated prior to induction Oxygen Delivery Method: Circle system utilized Preoxygenation: Pre-oxygenation with 100% oxygen Induction Type: IV induction Ventilation: Mask ventilation without difficulty LMA: LMA inserted LMA Size: 3.5 Tube type: Oral Number of attempts: 1 Placement Confirmation: positive ETCO2 and breath sounds checked- equal and bilateral Tube secured with: Tape Dental Injury: Teeth and Oropharynx as per pre-operative assessment

## 2017-10-06 NOTE — Anesthesia Preprocedure Evaluation (Signed)
Anesthesia Evaluation  Patient identified by MRN, date of birth, ID band Patient awake    Reviewed: Allergy & Precautions, NPO status , Patient's Chart, lab work & pertinent test results  History of Anesthesia Complications (+) history of anesthetic complications ("stopped breathing" while having wisdom teeth taken out)  Airway Mallampati: II  TM Distance: >3 FB Neck ROM: Full    Dental no notable dental hx.    Pulmonary neg sleep apnea, neg COPD, Current Smoker,    breath sounds clear to auscultation- rhonchi (-) wheezing      Cardiovascular Exercise Tolerance: Good (-) hypertension(-) CAD, (-) Past MI and (-) Cardiac Stents  Rhythm:Regular Rate:Normal - Systolic murmurs and - Diastolic murmurs    Neuro/Psych  Headaches, PSYCHIATRIC DISORDERS Anxiety Depression    GI/Hepatic Neg liver ROS, GERD  ,  Endo/Other  negative endocrine ROSneg diabetes  Renal/GU negative Renal ROS     Musculoskeletal negative musculoskeletal ROS (+)   Abdominal (+) - obese,   Peds  Hematology negative hematology ROS (+)   Anesthesia Other Findings Past Medical History: No date: Anxiety No date: Complication of anesthesia     Comment:  AFTER BTL IN 2006, BP BECAME VERY ELEVATED-WITH EPIDURAL              DURING DELIVERY, PT STARTED VOMITING AN SHAKING AND               EPIDURAL HAD TO BE STOPPED-PT STATES WITH WISDOM TEETH               SHE STOPPED BREATHING AND WAS SENT FOR SLEEP STUDY WHICH               WAS NEGATIVE No date: Depression No date: GERD (gastroesophageal reflux disease)     Comment:  OCC- NO MEDS No date: Migraine     Comment:  MIGRAINES No date: Thyroid nodule   Reproductive/Obstetrics                             Anesthesia Physical Anesthesia Plan  ASA: II  Anesthesia Plan: General   Post-op Pain Management:    Induction: Intravenous  PONV Risk Score and Plan: 1 and Ondansetron  and Dexamethasone  Airway Management Planned: LMA  Additional Equipment:   Intra-op Plan:   Post-operative Plan:   Informed Consent: I have reviewed the patients History and Physical, chart, labs and discussed the procedure including the risks, benefits and alternatives for the proposed anesthesia with the patient or authorized representative who has indicated his/her understanding and acceptance.   Dental advisory given  Plan Discussed with: CRNA and Anesthesiologist  Anesthesia Plan Comments:         Anesthesia Quick Evaluation

## 2017-10-06 NOTE — OR Nursing (Signed)
Patient has denied discomforts until she began to move around. Anxious about long drive home and wait time to pick up prescription pain med.  Requesting medicine prior to release to home.

## 2017-10-06 NOTE — Anesthesia Postprocedure Evaluation (Signed)
Anesthesia Post Note  Patient: Kelly Dorsey  Procedure(s) Performed: BREAST LUMPECTOMY WITH NEEDLE LOCALIZATION (Left Breast)  Patient location during evaluation: PACU Anesthesia Type: General Level of consciousness: awake and alert and oriented Pain management: pain level controlled Vital Signs Assessment: post-procedure vital signs reviewed and stable Respiratory status: spontaneous breathing, nonlabored ventilation and respiratory function stable Cardiovascular status: blood pressure returned to baseline and stable Postop Assessment: no signs of nausea or vomiting Anesthetic complications: no     Last Vitals:  Vitals:   10/06/17 1245 10/06/17 1302  BP: 114/76 106/75  Pulse: 83 81  Resp: 15 14  Temp: 36.4 C (!) 36.2 C  SpO2: 99% 100%    Last Pain:  Vitals:   10/06/17 1302  TempSrc: Tympanic                 Arantxa Piercey

## 2017-10-06 NOTE — Anesthesia Post-op Follow-up Note (Signed)
Anesthesia QCDR form completed.        

## 2017-10-09 LAB — SURGICAL PATHOLOGY

## 2017-10-09 NOTE — Addendum Note (Signed)
Addendum  created 10/09/17 1417 by Nelda Marseille, CRNA   Intraprocedure Event edited

## 2017-10-13 ENCOUNTER — Other Ambulatory Visit: Payer: Self-pay | Admitting: Neurology

## 2017-10-20 ENCOUNTER — Telehealth: Payer: Self-pay | Admitting: *Deleted

## 2017-10-20 NOTE — Telephone Encounter (Signed)
Called [employer] for Zolmitriptan PA. Did not have enough information for question she asked regarding if all medications tried were for >90 days. Printed out form from [employer] for PA. Will d/w Dr. Jaynee Eagles. Pt was already approved for the previous year for Zolmitriptan.

## 2017-10-26 ENCOUNTER — Other Ambulatory Visit: Payer: Self-pay | Admitting: Neurology

## 2017-10-27 NOTE — Telephone Encounter (Signed)
Faxed Zolmitriptan PA completed by Katrina to [facility]. Included office notes for supporting documents. Received a receipt of confirmation.

## 2017-11-08 NOTE — Telephone Encounter (Signed)
Received notification from [place] that Zolmitriptan has been DENIED due to patient not meeting the required clinical criteria for the use.   Criteria obtained from [place] tracks website:  Criteria  The following criteria must be met to exceed 12 units (doses) of a Triptan (oral tablets, nasal sprays, injections).  If different types of Triptans are required in a single month, the total maximum number of allowable units that can be obtained without prior approval remains the same. The same criteria must be fulfilled to obtain more than 12 units (doses) of combined products:  1. Documentation in beneficiary chart of diagnostic criteria for migraine headache or cluster headache.   AND  2. Greater than six moderate or severe headache days a month.   AND  3. Beneficiary must have tried and failed nonsteroidal anti-inflammatory (NSAIDS) within the last year or currently using NSAIDS, unless contraindicated.   AND  4. Beneficiary must concurrently be using migraine preventative medication(s) (i.e. Beta-Blockers, Tricyclic Antidepressants, Anticonvulsants) unless contraindicated, adverse effects occurred or no clinical benefit occurred after at least a 90 day trial at maximum tolerated dose.   AND  5. Beneficiary must not have history, symptoms, or signs of ischemic cardiac, cerebrovascular, or peripheral vascular syndromes; cardiovascular diseases; any type of angina pectoris, myocardial infarction(MI), or strokes; silent myocardial ischemia; transient ischemic attacks; ischemic bowel disease; uncontrolled hypertension; concurrent MAO-A inhibitor therapy (or within 2 weeks of discontinuing MAO-A inhibitor therapy); concurrent use of (or use within 24 hours of) ergotamine-containing or ergot-type medication; concurrent use within 24 hours of another 5- HT1 agonist; or hemiplegic or basilar migraine.    AND  6. Prescribing clinician has reviewed recommendations below based on evidence based studies.

## 2017-11-10 MED ORDER — ZOLMITRIPTAN 5 MG PO TABS: 5.0000 mg | ORAL_TABLET | ORAL | 1 refills | Status: DC | PRN

## 2017-11-10 NOTE — Telephone Encounter (Signed)
Spoke with patient. She said she has been filling Maxalt which was prescribed by Dr. Arelia Sneddon. She was getting 9 per month. She states she had been filling both each month and that she needed both to cover. She states that the Zolmitriptan has only been a max of 6 tablets per month. She suspects that is the reason. I informed her that I would d/w Dr. Jaynee Eagles. She verbalized appreciation.

## 2017-11-10 NOTE — Telephone Encounter (Signed)
Called patient and LVM asking for return call. Included office hours & phone number in message.

## 2017-11-10 NOTE — Telephone Encounter (Signed)
Looks like it was denied because she got a prescription of another triptan the same month. I would have her try to fill it one month when she does not fill the other triptan. Verify she probably got the relpax in the last 30 days too.  thanks

## 2017-11-10 NOTE — Addendum Note (Signed)
Addended by: Gildardo Griffes on: 11/10/2017 12:46 PM   Modules accepted: Orders

## 2017-11-10 NOTE — Telephone Encounter (Signed)
Pt is returning call.  

## 2017-11-16 ENCOUNTER — Telehealth: Payer: Self-pay

## 2017-11-16 NOTE — Telephone Encounter (Signed)
Rn receive PA from pharmacy on [facility] needs authorization. Rn had to call [place] tracks pt has medicaid. PA done and will know a decision with 24 hours. PA number is [phone]. Interactive ID for phone call today is [phone].

## 2017-11-22 ENCOUNTER — Encounter: Payer: Self-pay | Admitting: Neurology

## 2017-11-22 ENCOUNTER — Ambulatory Visit (INDEPENDENT_AMBULATORY_CARE_PROVIDER_SITE_OTHER): Payer: Medicaid Other | Admitting: Neurology

## 2017-11-22 DIAGNOSIS — G43711 Chronic migraine without aura, intractable, with status migrainosus: Secondary | ICD-10-CM

## 2017-11-22 MED ORDER — FREMANEZUMAB-VFRM 225 MG/1.5ML ~~LOC~~ SOSY: 225.0000 mg | PREFILLED_SYRINGE | SUBCUTANEOUS | 11 refills | Status: DC

## 2017-11-22 MED ORDER — FREMANEZUMAB-VFRM 225 MG/1.5ML ~~LOC~~ SOSY: 1.0000 | PREFILLED_SYRINGE | SUBCUTANEOUS | 0 refills | Status: DC

## 2017-11-22 NOTE — Telephone Encounter (Signed)
Per Dr. Jaynee Eagles, Zomig d/c'd.

## 2017-11-22 NOTE — Patient Instructions (Signed)
Fremanezumab-vfrm: Patient drug information Copyright [phone] [employer] rights reserved. (For additional information see "Fremanezumab-vfrm: Drug information") Brand Names: [place]  [name]  What is this drug used for?   It is used to prevent migraine headaches.  What do I need to tell my doctor BEFORE I take this drug?   If you have an allergy to this drug or any part of this drug.   If you are allergic to any drugs like this one, any other drugs, foods, or other substances. Tell your doctor about the allergy and what signs you had, like rash; hives; itching; shortness of breath; wheezing; cough; swelling of face, lips, tongue, or throat; or any other signs.   This drug may interact with other drugs or health problems.   Tell your doctor and pharmacist about all of your drugs (prescription or OTC, natural products, vitamins) and health problems. You must check to make sure that it is safe for you to take this drug with all of your drugs and health problems. Do not start, stop, or change the dose of any drug without checking with your doctor.  What are some things I need to know or do while I take this drug?   Tell all of your health care providers that you take this drug. This includes your doctors, nurses, pharmacists, and dentists.   Allergic reactions have happened within hours and up to 1 month after this drug was given. Tell your doctor right away about any bad effects after getting this drug.   Tell your doctor if you are pregnant or plan on getting pregnant. You will need to talk about the benefits and risks of using this drug while you are pregnant.   Tell your doctor if you are breast-feeding. You will need to talk about any risks to your baby.  What are some side effects that I need to call my doctor about right away?   WARNING/CAUTION: Even though it may be rare, some people may have very bad and sometimes deadly side effects when taking a drug. Tell your doctor or get medical  help right away if you have any of the following signs or symptoms that may be related to a very bad side effect:   Signs of an allergic reaction, like rash; hives; itching; red, swollen, blistered, or peeling skin with or without fever; wheezing; tightness in the chest or throat; trouble breathing, swallowing, or talking; unusual hoarseness; or swelling of the mouth, face, lips, tongue, or throat.   Very bad irritation where the shot was given.  What are some other side effects of this drug?   All drugs may cause side effects. However, many people have no side effects or only have minor side effects. Call your doctor or get medical help if any of these side effects or any other side effects bother you or do not go away:   Irritation where the shot is given.   These are not all of the side effects that may occur. If you have questions about side effects, call your doctor. Call your doctor for medical advice about side effects.   You may report side effects to your national health agency.  How is this drug best taken?   Use this drug as ordered by your doctor. Read all information given to you. Follow all instructions closely.   It is given as a shot into the fatty part of the skin on the top of the thigh, belly area, or upper arm.  If you will be giving yourself the shot, your doctor or nurse will teach you how to give the shot.   Follow how to use as you have been told by the doctor or read the package insert.   Wash your hands before use.   If stored in a refrigerator, let this drug come to room temperature before using it. Leave it at room temperature for at least 30 minutes. Do not heat this drug.   Do not shake.   Do not give into skin that is irritated, bruised, red, infected, or scarred.   Do not give into the same place as another shot.   If your dose is more than 1 injection, you may give it in the same body part. Make sure you do not give injections in the same spot you used for the  other injections.   Do not use if the solution is cloudy, leaking, or has particles.   This drug is colorless to a faint yellow. Do not use if the solution changes color.   Each prefilled syringe is for one use only.   Throw away needles in a needle/sharp disposal box. Do not reuse needles or other items. When the box is full, follow all local rules for getting rid of it. Talk with a doctor or pharmacist if you have any questions.  What do I do if I miss a dose?   Take a missed dose as soon as you think about it.   After taking a missed dose, start a new schedule based on when the dose is taken.  How do I store and/or throw out this drug?   Store in a refrigerator. Do not freeze.   Store in the carton to protect from light.   If needed, this drug can be left out at room temperature for up to 24 hours. Throw away drug if left at room temperature for more than 24 hours.   Protect from heat and sunlight.   Keep all drugs in a safe place. Keep all drugs out of the reach of children and pets.   Throw away unused or expired drugs. Do not flush down a toilet or pour down a drain unless you are told to do so. Check with your pharmacist if you have questions about the best way to throw out drugs. There may be drug take-back programs in your area.  General drug facts   If your symptoms or health problems do not get better or if they become worse, call your doctor.   Do not share your drugs with others and do not take anyone else's drugs.   Keep a list of all your drugs (prescription, natural products, vitamins, OTC) with you. Give this list to your doctor.   Talk with the doctor before starting any new drug, including prescription or OTC, natural products, or vitamins.   Some drugs may have another patient information leaflet. If you have any questions about this drug, please talk with your doctor, nurse, pharmacist, or other health care provider.   If you think there has been an overdose, call your  poison control center or get medical care right away. Be ready to tell or show what was taken, how much, and when it happened.  Use of UpToDate is subject to the Subscription and License Agreement. Topic [id number] Version 2.0

## 2017-11-22 NOTE — Addendum Note (Signed)
Addended by: Gildardo Griffes on: 11/22/2017 02:57 PM   Modules accepted: Orders

## 2017-11-22 NOTE — Progress Notes (Signed)
[facility]    Provider:  Dr Jaynee Eagles Referring Provider: Leonard Downing, * Primary Care Physician:  Leonard Downing, MD   CC:  Migraines  Interval History 11/22/2017: Say her last a year ago, tried botox but she never returned for further treatments. Her migraines are "horrible". She tried botox once but didn;t like the way it made her feel. She has 20 migraine days a month. She takes a triptan and frequently runs out. No medication overuse. She takes topiramate twice a day. It was recently increased. Her mouth is very dry. No medication overuse. Migraines can last all day, pulsating, unilateral, photo/phonophobia. Nausea. She is quite distressed.no aura.  Meds tried: Rizatriptan, Depakote, propranolol, tramadol, cymbalta, topamax, amitriptyline, imitrex and multiple others over the last 20+ years, Botox x once (didn't like the way it made her feel).   Addendum 03/2017: Her fiance has noticed that she snores, grinds her teeth and sometimes grasps for air at night. She reports waking up every morning with a headache. [name] that a previous doctor had recommended she have a sleep study but she never had it done. Requested sleep eval.  HPI:  Kelly Dorsey is a 42 y.o. female here as a referral from Dr. Arelia Sneddon for migraines PMHx depression, anxiety, current smoker, migraines. She has had migraines since the age of 47. They can be in the forehead and in the eyes, the main headaches are on the right side, pressure behind the eye, sensitivity to light, sound, smells, has to go into a dark room and sleep. Dark rooms make it better. She has nausea and vomits. Migraines 3-4x a week on average 16 or more a month. No aura. They can last up to 3 days on average 12-16 hours. She has nausea and vomiting. Maxalt helps. Worse with season changes, pressure outside makes it worse. She has kept multiple migraine diaries and no foods were identified. Stress and anxiety makes it  worse. Can be severe but on average 10/10 in pain. It comes on quickly, not a slow build up. She takes her maxalt and she lays down it helps but doesn't stop it. She is smoking. She is not interested in taking anything else. She has a FHx of migraines in cousins and grandmother and son.   Meds tried: Rizatriptan, Depakote, propranolol, tramadol, cymbalta, topamax, amitriptyline, imitrex and multiple others over the last 20+ years  Reviewed notes, labs and imaging from outside physicians, which showed:  Reviewed notes from [facility]. 3 migraines daily. She has left work multiple times a week. Pressure in her head daily. On cyclobenzaprine, mixed amphetamines and clonazepam. It appears they tried Depakote which made her depressed, horrible dreams like nightmares but it helped her headaches and at some point possibly they lowered the dose to 125 daily delayed release. They also tried rizatriptan. Clonazepam. Handwritten notes that I can't read. They tried propranolol.  CBC in October 26 was normal, CMP in October 2016 was unremarkable with normal creatinine 0.88 and normal BUN 8   ROS: dizziness, headache, numbness, agitation otherwise normal  Social History   Socioeconomic History  . Marital status: Widowed    Spouse name: Not on file  . Number of children: 3  . Years of education: 10  . Highest education level: Not on file  Social Needs  . Financial resource strain: Not on file  . Food insecurity - worry: Not on file  . Food insecurity - inability: Not on file  . Transportation  needs - medical: Not on file  . Transportation needs - non-medical: Not on file  Occupational History  . Occupation: None  Tobacco Use  . Smoking status: Current Every Day Smoker    Packs/day: 0.25    Years: 22.00    Pack years: 5.50    Types: Cigarettes  . Smokeless tobacco: Never Used  Substance and Sexual Activity  . Alcohol use: No    Frequency: Never  . Drug use: No  .  Sexual activity: Not on file  Other Topics Concern  . Not on file  Social History Narrative   Drinks about 1 caffeine drinks a day    Right handed   Lives at home with her children & fiance'    Family History  Problem Relation Age of Onset  . Hypertension Mother   . Hypertension Father   . Diabetes Father   . Migraines Father   . Migraines Brother   . Migraines Paternal Grandmother   . Breast cancer Paternal Grandmother 32       post meno  . Migraines Paternal Grandfather   . Breast cancer Maternal Aunt 52  . Breast cancer Paternal Aunt 35  . Breast cancer Cousin 13    Past Medical History:  Diagnosis Date  . Anxiety   . Complication of anesthesia    AFTER BTL IN 2006, BP BECAME VERY ELEVATED-WITH EPIDURAL DURING DELIVERY, PT STARTED VOMITING AN SHAKING AND EPIDURAL HAD TO BE STOPPED-PT STATES WITH WISDOM TEETH SHE STOPPED BREATHING AND WAS SENT FOR SLEEP STUDY WHICH WAS NEGATIVE  . Depression   . GERD (gastroesophageal reflux disease)    OCC- NO MEDS  . Migraine    MIGRAINES  . Mood swings   . Thyroid nodule     Past Surgical History:  Procedure Laterality Date  . APPENDECTOMY    . BREAST BIOPSY Left 09/18/2017   [place] core path pending  . BREAST EXCISIONAL BIOPSY Left 10/06/2017   lumpectomy with NP  . BREAST LUMPECTOMY WITH NEEDLE LOCALIZATION Left 10/06/2017   Procedure: BREAST LUMPECTOMY WITH NEEDLE LOCALIZATION;  Surgeon: Leonie Green, MD;  Location: [facility];  Service: General;  Laterality: Left;  . COLONOSCOPY    . DILITATION & CURRETTAGE/HYSTROSCOPY WITH NOVASURE ABLATION N/A 07/17/2015   Procedure: DILATATION & CURETTAGE/HYSTEROSCOPY WITH NOVASURE ABLATION;  Surgeon: Aletha Halim, MD;  Location: [facility];  Service: Gynecology;  Laterality: N/A;  . LAPAROSCOPY ABDOMEN DIAGNOSTIC    . LASIK    . TUBAL LIGATION    . WISDOM TOOTH EXTRACTION      Current Outpatient Medications  Medication Sig Dispense Refill  . amphetamine-dextroamphetamine  (ADDERALL XR) 20 MG 24 hr capsule Take 20 mg by mouth daily.    . ARIPiprazole (ABILIFY) 5 MG tablet Take 5 mg by mouth daily.    . cholecalciferol (VITAMIN D) 1000 units tablet Take 1,000 Units by mouth daily.    . clonazePAM (KLONOPIN) 0.5 MG tablet Take 0.5-1 mg by mouth at bedtime. 1-2 tablets depending on pain    . desipramine (NORPRAMIN) 50 MG tablet Take 50 mg by mouth at bedtime.    Marland Kitchen loratadine-pseudoephedrine (CLARITIN-D 12-HOUR) 5-120 MG tablet Take 1 tablet by mouth daily as needed for allergies.    . rizatriptan (MAXALT) 10 MG tablet Take 10 mg by mouth as needed for migraine. For migraines. May repeat in 2 hours if needed    . topiramate (TOPAMAX) 50 MG tablet Take 100 mg by mouth 2 (two) times daily.    Marland Kitchen  Fremanezumab-vfrm ([name]) 225 MG/1.5ML SOSY Inject 225 mg into the skin every 30 (thirty) days. 1 Syringe 11   No current facility-administered medications for this visit.     Allergies as of 11/22/2017 - Review Complete 11/22/2017  Allergen Reaction Noted  . Other  07/09/2015    Vitals: BP 125/88 (BP Location: Right Arm, Patient Position: Sitting)   Pulse (!) 110   Ht 5\' 6"  (1.676 m)   Wt 148 lb (67.1 kg)   BMI 23.89 kg/m  Last Weight:  Wt Readings from Last 1 Encounters:  11/22/17 148 lb (67.1 kg)   Last Height:   Ht Readings from Last 1 Encounters:  11/22/17 5\' 6"  (1.676 m)   Physical exam: Exam: Gen: Distressed over migraines                CV: RRR, no MRG. No Carotid Bruits. No peripheral edema, warm, nontender Eyes: Conjunctivae clear without exudates or hemorrhage  Neuro: Detailed Neurologic Exam  Speech:    Speech is normal; fluent and spontaneous with normal comprehension.  Cognition:    The patient is oriented to person, place, and time;     recent and remote memory intact;     language fluent;     normal attention, concentration,     fund of knowledge Cranial Nerves:    The pupils are equal, round, and reactive to light. The fundi are  normal and spontaneous venous pulsations are present. Visual fields are full to finger confrontation. Extraocular movements are intact. Trigeminal sensation is intact and the muscles of mastication are normal. The face is symmetric. The palate elevates in the midline. Hearing intact. Voice is normal. Shoulder shrug is normal. The tongue has normal motion without fasciculations.   Coordination:    Normal finger to nose and heel to shin. Normal rapid alternating movements.   Gait:    Heel-toe and tandem gait are normal.   Motor Observation:    No asymmetry, no atrophy, and no involuntary movements noted. Tone:    Normal muscle tone.    Posture:    Posture is normal. normal erect    Strength:    Strength is V/V in the upper and lower limbs.      Sensation: intact to LT     Reflex Exam:  DTR's:    Deep tendon reflexes in the upper and lower extremities are normal bilaterally.   Toes:    The toes are downgoing bilaterally.   Clonus:    Clonus is absent.     Assessment/Plan:  42 year old with chronic migraines without aura with status migrainosus intractabke. Have not seen patient in over a year. Will retry botox and start [name]. Unclear what Medicaid will pay for however.  - We injected patient once with botox close to a year ago, she did not return for further treatments. Discussed compliance with patient. - She says migraines have worsened since being seen  a year ago - She has tried and failed most first line and second line agents over the last 20+ years. Will can medications she has tried in the past which she declines - Initiate [name], new cgrp medications - MRI of the brain was normal - sleep evaluation was normal. - Smoking: encouraged cessation, can worsen migraines - Restart Botox - Continue Topamax  Cc: Dr. Molli Knock, MD  [facility] [address] [place], [place] [address]  Phone [phone] Fax  [phone]  A total of 25 minutes was  spent face-to-face with this patient. Over half this time was spent on counseling patient on the intractable migraine diagnosis and different diagnostic and therapeutic options available.

## 2017-11-22 NOTE — Telephone Encounter (Signed)
Pt in office today for visit. Will be discussing options for medication management regarding Zomig, etc.

## 2017-11-22 NOTE — Progress Notes (Signed)
Discussed [name] injection with the patient and gave them instruction sheets from the box. The patient is aware that injection is subcutaneous and should be given every 30 days. Syringes should remain refrigerated until ready for use. Let the syringe sit at room temperature for 30 minutes prior to injection. The patient was also educated on the available sites to rotate each month (abdomen, thigh, and back of upper arm). I discussed proper technique for skin prep prior to injection including to wash hands, cleanse the site inside to outside in a circular motion and allow skin to air dry. The patient was instructed how to pinch the skin, inject the syringe at a 45-90 degree angle, and inject all of the medication. Immediately place the used syringe in the sharps container and do not recap. RN administered medication today in the  R mid/upper abdomen. The patient tolerated well and verbalized understanding of instructions.

## 2017-12-11 ENCOUNTER — Encounter: Payer: Self-pay | Admitting: Neurology

## 2017-12-11 ENCOUNTER — Telehealth: Payer: Self-pay | Admitting: Neurology

## 2017-12-11 ENCOUNTER — Ambulatory Visit: Payer: Medicaid Other | Admitting: Neurology

## 2017-12-11 VITALS — BP 131/83 | HR 123

## 2017-12-11 DIAGNOSIS — G43711 Chronic migraine without aura, intractable, with status migrainosus: Secondary | ICD-10-CM

## 2017-12-11 MED ORDER — FREMANEZUMAB-VFRM 225 MG/1.5ML ~~LOC~~ SOSY: 225.0000 mg | PREFILLED_SYRINGE | SUBCUTANEOUS | 11 refills | Status: DC

## 2017-12-11 NOTE — Progress Notes (Signed)

## 2017-12-11 NOTE — Progress Notes (Signed)
Botox- 100 units x 2 vials Lot: [id number] Expiration: 05/2020 NDC: 1587-2761-84  Bacteriostatic 0.9% Sodium Chloride- 95mL total Lot: [id number] Expiration: 02/22/2019 NDC: 3943-2003-79  Dx: K44.619 S/P  //BCrn

## 2017-12-11 NOTE — Telephone Encounter (Signed)
Please call pt to schedule next BOTOX appt for 12 weeks. Thank you.

## 2017-12-12 ENCOUNTER — Ambulatory Visit: Payer: Medicaid Other | Admitting: Neurology

## 2017-12-13 NOTE — Telephone Encounter (Signed)
I called and scheduled the patient.  °

## 2018-03-01 NOTE — Telephone Encounter (Signed)
I called the pharmacy to schedule delivery. They stated that the insurance was showing no longer active. I called the patient and spoke with her and she said that her medicaid is still currently active. She is going to call and verify.

## 2018-03-07 ENCOUNTER — Telehealth: Payer: Self-pay | Admitting: *Deleted

## 2018-03-07 NOTE — Telephone Encounter (Signed)
Spoke with patient regarding [name] PA. She stated that she has taken Depakote for about a month, Propranolol 6 months to a year, Amitriptyline >1 year, Cymbalta 6 months, Topiramate at least 2 years. She stated that she has made lifestyle changes such as exercise, diet adjustment, and has seen a chiropractor as well.   She stated that her Medicaid goes until the 23rd of May and then it will be terminated due to a change in income. RN stated she would let Andee Poles know so Botox can hopefully be approved for one more time.   Patient verbalized appreciation.   [name] PA completed, Medicaid card copy printed and office note printed. PA signed by Dr. Jaynee Eagles and faxed to [facility]. Received a receipt of confirmation.

## 2018-03-14 ENCOUNTER — Encounter: Payer: Self-pay | Admitting: Neurology

## 2018-03-14 ENCOUNTER — Ambulatory Visit: Payer: Medicaid Other | Admitting: Neurology

## 2018-03-14 VITALS — BP 123/85 | HR 107

## 2018-03-14 DIAGNOSIS — G43711 Chronic migraine without aura, intractable, with status migrainosus: Secondary | ICD-10-CM | POA: Diagnosis not present

## 2018-03-14 MED ORDER — FREMANEZUMAB-VFRM 225 MG/1.5ML ~~LOC~~ SOSY: 675.0000 mg | PREFILLED_SYRINGE | SUBCUTANEOUS | 4 refills | Status: DC

## 2018-03-14 NOTE — Progress Notes (Signed)
Botox- 100 units x 2 vials Lot: [id number] Expiration: 07/2020 NDC: 3474-2595-63  Bacteriostatic 0.9% Sodium Chloride- 80mL total Lot: [id number] Expiration: 04/04/2018 NDC: 3295-1884-16  Dx: S06.301 S/P pharmacy- [facility]

## 2018-03-14 NOTE — Progress Notes (Signed)
Tremendous improvement, not one migraine since last botox appointment. + eyes, +masseters, +temples, NO SHOULDERS, put some extra in the frontalis     Consent Form Botulism Toxin Injection For Chronic Migraine  Botulism toxin has been approved by the Federal drug administration for treatment of chronic migraine. Botulism toxin does not cure chronic migraine and it may not be effective in some patients.  The administration of botulism toxin is accomplished by injecting a small amount of toxin into the muscles of the neck and head. Dosage must be titrated for each individual. Any benefits resulting from botulism toxin tend to wear off after 3 months with a repeat injection required if benefit is to be maintained. Injections are usually done every 3-4 months with maximum effect peak achieved by about 2 or 3 weeks. Botulism toxin is expensive and you should be sure of what costs you will incur resulting from the injection.  The side effects of botulism toxin use for chronic migraine may include:   -Transient, and usually mild, facial weakness with facial injections  -Transient, and usually mild, head or neck weakness with head/neck injections  -Reduction or loss of forehead facial animation due to forehead muscle              weakness  -Eyelid drooping  -Dry eye  -Pain at the site of injection or bruising at the site of injection  -Double vision  -Potential unknown long term risks  Contraindications: You should not have Botox if you are pregnant, nursing, allergic to albumin, have an infection, skin condition, or muscle weakness at the site of the injection, or have myasthenia gravis, Lambert-Eaton syndrome, or ALS.  It is also possible that as with any injection, there may be an allergic reaction or no effect from the medication. Reduced effectiveness after repeated injections is sometimes seen and rarely infection at the injection site may occur. All care will be taken to prevent these  side effects. If therapy is given over a long time, atrophy and wasting in the muscle injected may occur. Occasionally the patient's become refractory to treatment because they develop antibodies to the toxin. In this event, therapy needs to be modified.  I have read the above information and consent to the administration of botulism toxin.    ______________  _____   _________________  Patient signature     Date   Witness signature       BOTOX PROCEDURE NOTE FOR MIGRAINE HEADACHE    Contraindications and precautions discussed with patient(above). Aseptic procedure was observed and patient tolerated procedure. Procedure performed by Dr. Georgia Dom  The condition has existed for more than 6 months, and pt does not have a diagnosis of ALS, Myasthenia Gravis or Lambert-Eaton Syndrome. Risks and benefits of injections discussed and pt agrees to proceed with the procedure. Written consent obtained  These injections are medically necessary. He receives good benefits from these injections. These injections do not cause sedations or hallucinations which the oral therapies may cause.  Indication/Diagnosis: chronic migraine BOTOX(J0585) injection was performed according to protocol by [employer]. 200 units of BOTOX was dissolved into 4 cc NS.  NDC: [address]-60   Description of procedure:  The patient was placed in a sitting position. The standard protocol was used for Botox as follows, with 5 units of Botox injected at each site:   -Procerus muscle, midline injection  -Corrugator muscle, bilateral injection  -Frontalis muscle, bilateral injection, with 2 sites each side, medial injection was performed in the upper one  third of the frontalis muscle, in the region vertical from the medial inferior edge of the superior orbital rim. The lateral injection was again in the upper one third of the forehead vertically above the lateral limbus of the cornea, 1.5 cm lateral to the medial injection  site.  -Temporalis muscle injection, 4 sites, bilaterally. The first injection was 3 cm above the tragus of the ear, second injection site was 1.5 cm to 3 cm up from the first injection site in line with the tragus of the ear. The third injection site was 1.5-3 cm forward between the first 2 injection sites. The fourth injection site was 1.5 cm posterior to the second injection site.  -Occipitalis muscle injection, 3 sites, bilaterally. The first injection was done one half way between the occipital protuberance and the tip of the mastoid process behind the ear. The second injection site was done lateral and superior to the first, 1 fingerbreadth from the first injection. The third injection site was 1 fingerbreadth superiorly and medially from the first injection site.  -Cervical paraspinal muscle injection, 2 sites, bilateral knee first injection site was 1 cm from the midline of the cervical spine, 3 cm inferior to the lower border of the occipital protuberance. The second injection site was 1.5 cm superiorly and laterally to the first injection site.  -Trapezius muscle injection was performed at 3 sites, bilaterally. The first injection site was in the upper trapezius muscle halfway between the inflection point of the neck, and the acromion. The second injection site was one half way between the acromion and the first injection site. The third injection was done between the first injection site and the inflection point of the neck.   Will return for repeat injection in 3 months.   A 200 unit sof Botox was used, 155 units were injected, the rest of the Botox was wasted. The patient tolerated the procedure well, there were no complications of the above procedure.

## 2018-03-21 NOTE — Telephone Encounter (Signed)
Spoke with [facility]. Pt is unable to get medication in three month supplies. Dr. Jaynee Eagles made aware.   Call ID# [phone]

## 2018-03-21 NOTE — Telephone Encounter (Addendum)
Spoke with [facility]. Kelly Dorsey has been approved from 03/07/18-03/02/19.   ID # [id number]

## 2018-05-18 ENCOUNTER — Telehealth: Payer: Self-pay | Admitting: Neurology

## 2018-05-18 ENCOUNTER — Encounter: Payer: Self-pay | Admitting: Neurology

## 2018-05-18 NOTE — Telephone Encounter (Signed)
Called the patient and made her aware that we have 3 samples in the fridge here with her name on them. She can come Monday to pick them up. No answer. VM box was full unable to leave message. Will send message on my chart also

## 2018-05-18 NOTE — Telephone Encounter (Signed)
We have [name] samples waiting for her, 3 months worth, have her come Monday to pick up.   Kelly Dorsey or Kelly Dorsey can you make sure we have 3 set aside for patient? Thanks!

## 2018-05-18 NOTE — Telephone Encounter (Signed)
Called patient to reschedule 8/28 Botox appt. Patient no longer has insurance and requested that this appt be cancelled and rescheduled at a later date. While on the phone with patient, she requested that she be called so that she can discuss getting samples of [name].

## 2018-05-21 NOTE — Telephone Encounter (Signed)
Pt friend came to office to pick up samples. She was not listed on DPR form. Pt aware she has to pick up samples herself. Samples in fridge with patient instructions.

## 2018-05-23 MED ORDER — FREMANEZUMAB-VFRM 225 MG/1.5ML ~~LOC~~ SOSY: 675.0000 mg | PREFILLED_SYRINGE | SUBCUTANEOUS | 0 refills | Status: DC

## 2018-05-23 NOTE — Telephone Encounter (Signed)
Pt came and picked up the 3 [name] samples. Sample order was placed.

## 2018-05-23 NOTE — Addendum Note (Signed)
Addended by: Gildardo Griffes on: 05/23/2018 12:23 PM   Modules accepted: Orders

## 2018-06-20 ENCOUNTER — Ambulatory Visit: Payer: Medicaid Other | Admitting: Neurology

## 2018-09-26 ENCOUNTER — Other Ambulatory Visit: Payer: Self-pay | Admitting: Neurology

## 2018-09-26 MED ORDER — METHYLPREDNISOLONE 4 MG PO TBPK: ORAL_TABLET | ORAL | 1 refills | Status: DC

## 2018-09-28 ENCOUNTER — Other Ambulatory Visit: Payer: Self-pay | Admitting: Neurology

## 2018-12-18 ENCOUNTER — Other Ambulatory Visit: Payer: Self-pay | Admitting: *Deleted

## 2018-12-18 MED ORDER — FREMANEZUMAB-VFRM 225 MG/1.5ML ~~LOC~~ SOSY: 675.0000 mg | PREFILLED_SYRINGE | SUBCUTANEOUS | 0 refills | Status: DC

## 2018-12-19 ENCOUNTER — Telehealth: Payer: Self-pay | Admitting: *Deleted

## 2018-12-19 ENCOUNTER — Encounter: Payer: Self-pay | Admitting: *Deleted

## 2018-12-19 NOTE — Telephone Encounter (Signed)
Completed [name] 225 mg PA on Cover My Meds. KEY: [id number].  CaseId: [id number] Status: Approved Review Type: Prior Auth Coverage Start Date:11/19/2018 Coverage End Date:12/19/2019  Sent pt mychart message.

## 2019-03-25 ENCOUNTER — Telehealth: Payer: Self-pay | Admitting: Neurology

## 2019-03-25 NOTE — Telephone Encounter (Signed)
03-25-19 Pt has called and gave verbal consent to file her insurance for a [email or web address] vv Email confirmed as:[email or web address]  Pt understands that although there may be some limitations with this type of visit, we will take all precautions to reduce any security or privacy concerns.  Pt understands that this will be treated like an in office visit and we will file with pt's insurance, and there may be a patient responsible charge related to this service. *email sent*

## 2019-03-25 NOTE — Telephone Encounter (Signed)
Noted thank you

## 2019-04-11 ENCOUNTER — Other Ambulatory Visit: Payer: Self-pay | Admitting: Neurology

## 2019-04-15 ENCOUNTER — Encounter: Payer: Self-pay | Admitting: Neurology

## 2019-04-15 NOTE — Telephone Encounter (Signed)
Spoke with pt. Confirmed pt using name & DOB. Pt provided updates to chart. She confirmed she received the doxy link. She understands she will receive a call to check-in 30 minutes prior to appt Wed then she will need to click the link and check-in to virtual waiting room around 10:20-25AM. Pt verbalized appreciation.

## 2019-04-17 ENCOUNTER — Encounter: Payer: Self-pay | Admitting: Neurology

## 2019-04-17 ENCOUNTER — Ambulatory Visit (INDEPENDENT_AMBULATORY_CARE_PROVIDER_SITE_OTHER): Payer: PRIVATE HEALTH INSURANCE | Admitting: Neurology

## 2019-04-17 ENCOUNTER — Other Ambulatory Visit: Payer: Self-pay

## 2019-04-17 DIAGNOSIS — G43711 Chronic migraine without aura, intractable, with status migrainosus: Secondary | ICD-10-CM

## 2019-04-17 NOTE — Progress Notes (Signed)
[facility]    Provider:  Dr Jaynee Eagles Referring Provider: Leonard Downing, * Primary Care Physician:  Leonard Downing, MD   CC:  Migraines  Virtual Visit via Video Note  I connected with Kelly Dorsey on 04/17/19 at 10:30 AM EDT by a video enabled telemedicine application and verified that I am speaking with the correct person using two identifiers.  Location: Patient: office Provider: office   I discussed the limitations of evaluation and management by telemedicine and the availability of in person appointments. The patient expressed understanding and agreed to proceed.  Follow Up Instructions:    I discussed the assessment and treatment plan with the patient. The patient was provided an opportunity to ask questions and all were answered. The patient agreed with the plan and demonstrated an understanding of the instructions.   The patient was advised to call back or seek an in-person evaluation if the symptoms worsen or if the condition fails to improve as anticipated.  I provided 25 minutes of non-face-to-face time during this encounter.   Melvenia Beam, MD  Interval history April 17, 2019: This is a patient with refractory migraines, tried multiple medications including Botox.  These medications did not work and she was having at least 16 migraine days a month.  We started her atrophy and she is doing extremely well.  Her migraines have significantly improved.  We did discuss her insurance issues, apparently we did get the PA approved until February 2021 but her co-pay for 3 shots is $1000.  At this point she has been using the co-pay card about 6 months the co-pay card should last her about a year, I did go and speak with my nurse regarding this and my nurse came in and also discussed with patient.  She is moving pharmacies we recommend she bring her co-pay card or ask 1 pharmacy to transfer all the information over for her.  She is fine  with acute management.  She does not feel at this time that she needs anything else she wants to continue with atrophy.  We discussed acute management and other options but atrophy seems to be working.  She is a current every day smoker I did discuss smoking cessation.  Interval History 11/22/2017: Say her last a year ago, tried botox but she never returned for further treatments. Her migraines are "horrible". She tried botox once but didn;t like the way it made her feel. She has 20 migraine days a month. She takes a triptan and frequently runs out. No medication overuse. She takes topiramate twice a day. It was recently increased. Her mouth is very dry. No medication overuse. Migraines can last all day, pulsating, unilateral, photo/phonophobia. Nausea. She is quite distressed.no aura.  Meds tried: Rizatriptan, Depakote, propranolol, tramadol, cymbalta, topamax, amitriptyline, imitrex and multiple others over the last 20+ years, Botox x once (didn't like the way it made her feel).   Addendum 03/2017: Her fiance has noticed that she snores, grinds her teeth and sometimes grasps for air at night. She reports waking up every morning with a headache. [name] that a previous doctor had recommended she have a sleep study but she never had it done. Requested sleep eval.  HPI:  Kelly Dorsey is a 43 y.o. female here as a referral from Dr. Arelia Sneddon for migraines PMHx depression, anxiety, current smoker, migraines. She has had migraines since the age of 73. They can be in the forehead and in the eyes, the main  headaches are on the right side, pressure behind the eye, sensitivity to light, sound, smells, has to go into a dark room and sleep. Dark rooms make it better. She has nausea and vomits. Migraines 3-4x a week on average 16 or more a month. No aura. They can last up to 3 days on average 12-16 hours. She has nausea and vomiting. Maxalt helps. Worse with season changes, pressure outside makes it worse. She has kept  multiple migraine diaries and no foods were identified. Stress and anxiety makes it worse. Can be severe but on average 10/10 in pain. It comes on quickly, not a slow build up. She takes her maxalt and she lays down it helps but doesn't stop it. She is smoking. She is not interested in taking anything else. She has a FHx of migraines in cousins and grandmother and son.   Meds tried: Rizatriptan, Depakote, propranolol, tramadol, cymbalta, topamax, amitriptyline, imitrex and multiple others over the last 20+ years  Reviewed notes, labs and imaging from outside physicians, which showed:  Reviewed notes from [facility]. 3 migraines daily. She has left work multiple times a week. Pressure in her head daily. On cyclobenzaprine, mixed amphetamines and clonazepam. It appears they tried Depakote which made her depressed, horrible dreams like nightmares but it helped her headaches and at some point possibly they lowered the dose to 125 daily delayed release. They also tried rizatriptan. Clonazepam. Handwritten notes that I can't read. They tried propranolol.  CBC in October 26 was normal, CMP in October 2016 was unremarkable with normal creatinine 0.88 and normal BUN 8   ROS: dizziness, headache, numbness, agitation otherwise normal  Social History   Socioeconomic History  . Marital status: Widowed    Spouse name: Not on file  . Number of children: 3  . Years of education: 10  . Highest education level: Not on file  Occupational History  . Occupation: None  Social Needs  . Financial resource strain: Not on file  . Food insecurity    Worry: Not on file    Inability: Not on file  . Transportation needs    Medical: Not on file    Non-medical: Not on file  Tobacco Use  . Smoking status: Current Every Day Smoker    Packs/day: 0.25    Years: 22.00    Pack years: 5.50    Types: Cigarettes  . Smokeless tobacco: Never Used  Substance and Sexual Activity  . Alcohol use: No     Frequency: Never  . Drug use: No  . Sexual activity: Not on file  Lifestyle  . Physical activity    Days per week: Not on file    Minutes per session: Not on file  . Stress: Not on file  Relationships  . Social Herbalist on phone: Not on file    Gets together: Not on file    Attends religious service: Not on file    Active member of club or organization: Not on file    Attends meetings of clubs or organizations: Not on file    Relationship status: Not on file  . Intimate partner violence    Fear of current or ex partner: Not on file    Emotionally abused: Not on file    Physically abused: Not on file    Forced sexual activity: Not on file  Other Topics Concern  . Not on file  Social History Narrative   Drinks about 2 cups of  caffeine drinks a day    Right handed   Lives at home with her children & fiance'    Family History  Problem Relation Age of Onset  . Hypertension Mother   . Hypertension Father   . Diabetes Father   . Migraines Father   . Migraines Brother   . Migraines Paternal Grandmother   . Breast cancer Paternal Grandmother 11       post meno  . Migraines Paternal Grandfather   . Breast cancer Maternal Aunt 52  . Breast cancer Paternal Aunt 50  . Breast cancer Cousin 65    Past Medical History:  Diagnosis Date  . Anxiety   . Complication of anesthesia    AFTER BTL IN 2006, BP BECAME VERY ELEVATED-WITH EPIDURAL DURING DELIVERY, PT STARTED VOMITING AN SHAKING AND EPIDURAL HAD TO BE STOPPED-PT STATES WITH WISDOM TEETH SHE STOPPED BREATHING AND WAS SENT FOR SLEEP STUDY WHICH WAS NEGATIVE  . Depression   . GERD (gastroesophageal reflux disease)    OCC- NO MEDS  . Migraine    MIGRAINES  . Mood swings   . Thyroid nodule     Past Surgical History:  Procedure Laterality Date  . APPENDECTOMY    . BREAST BIOPSY Left 09/18/2017   [place] core path pending  . BREAST EXCISIONAL BIOPSY Left 10/06/2017   lumpectomy with NP  . BREAST LUMPECTOMY  WITH NEEDLE LOCALIZATION Left 10/06/2017   Procedure: BREAST LUMPECTOMY WITH NEEDLE LOCALIZATION;  Surgeon: Leonie Green, MD;  Location: [facility];  Service: General;  Laterality: Left;  . COLONOSCOPY    . DILITATION & CURRETTAGE/HYSTROSCOPY WITH NOVASURE ABLATION N/A 07/17/2015   Procedure: DILATATION & CURETTAGE/HYSTEROSCOPY WITH NOVASURE ABLATION;  Surgeon: Aletha Halim, MD;  Location: [facility];  Service: Gynecology;  Laterality: N/A;  . LAPAROSCOPY ABDOMEN DIAGNOSTIC    . LASIK    . TUBAL LIGATION    . WISDOM TOOTH EXTRACTION      Current Outpatient Medications  Medication Sig Dispense Refill  . [name] 225 MG/1.5ML SOSY INJECT 675 MG INTO THE SKIN EVERY 3 (THREE) MONTHS. 4.5 mL 0  . amphetamine-dextroamphetamine (ADDERALL XR) 20 MG 24 hr capsule Take 20 mg by mouth daily.    . ARIPiprazole (ABILIFY) 5 MG tablet Take 5 mg by mouth daily.    . cholecalciferol (VITAMIN D) 1000 units tablet Take 1,000 Units by mouth daily.    . clonazePAM (KLONOPIN) 0.5 MG tablet Take 0.5-1 mg by mouth at bedtime. 1-2 tablets depending on pain    . desipramine (NORPRAMIN) 50 MG tablet Take 25 mg by mouth at bedtime.     Marland Kitchen loratadine-pseudoephedrine (CLARITIN-D 12-HOUR) 5-120 MG tablet Take 1 tablet by mouth daily as needed for allergies.    . methylPREDNISolone (MEDROL DOSEPAK) 4 MG TBPK tablet follow package directions (Patient not taking: Reported on 04/15/2019) 21 tablet 1  . rizatriptan (MAXALT) 10 MG tablet Take 10 mg by mouth as needed for migraine. For migraines. May repeat in 2 hours if needed    . topiramate (TOPAMAX) 50 MG tablet Take 100 mg by mouth 2 (two) times daily.     No current facility-administered medications for this visit.     Allergies as of 04/17/2019 - Review Complete 04/15/2019  Allergen Reaction Noted  . Other  07/09/2015    Vitals: There were no vitals taken for this visit. Last Weight:  Wt Readings from Last 1 Encounters:  11/22/17 148 lb (67.1 kg)   Last  Height:   Ht Readings  from Last 1 Encounters:  11/22/17 5\' 6"  (1.676 m)   Physical exam: Exam: Gen: Distressed over migraines                CV: RRR, no MRG. No Carotid Bruits. No peripheral edema, warm, nontender Eyes: Conjunctivae clear without exudates or hemorrhage  Neuro: Detailed Neurologic Exam  Speech:    Speech is normal; fluent and spontaneous with normal comprehension.  Cognition:    The patient is oriented to person, place, and time;     recent and remote memory intact;     language fluent;     normal attention, concentration,     fund of knowledge Cranial Nerves:    The pupils are equal, round, and reactive to light. The fundi are normal and spontaneous venous pulsations are present. Visual fields are full to finger confrontation. Extraocular movements are intact. Trigeminal sensation is intact and the muscles of mastication are normal. The face is symmetric. The palate elevates in the midline. Hearing intact. Voice is normal. Shoulder shrug is normal. The tongue has normal motion without fasciculations.   Coordination:    Normal finger to nose and heel to shin. Normal rapid alternating movements.   Gait:    Heel-toe and tandem gait are normal.   Motor Observation:    No asymmetry, no atrophy, and no involuntary movements noted. Tone:    Normal muscle tone.    Posture:    Posture is normal. normal erect    Strength:    Strength is V/V in the upper and lower limbs.      Sensation: intact to LT     Reflex Exam:  DTR's:    Deep tendon reflexes in the upper and lower extremities are normal bilaterally.   Toes:    The toes are downgoing bilaterally.   Clonus:    Clonus is absent.     Assessment/Plan:  43 year old with chronic migraines without aura with status migrainosus intractabke.  Doing fantastic on atrophy, continue..  - She has tried and failed most first line and second line agents over the last 20+ years.  - Continue [name], new cgrp  medications - MRI of the brain was normal - sleep evaluation was normal. - Smoking: encouraged cessation, can worsen migraines  Cc: Dr. Molli Knock, MD  [facility] [address] [place], [place] [address]  Phone [phone] Fax [phone]  A total of 25 minutes was spent face-to-face video with this patient. Over half this time was spent on counseling patient on the intractable migraine diagnosis and different diagnostic and therapeutic options available.

## 2019-05-18 IMAGING — CT CT MAXILLOFACIAL W/ CM
1 series · 15 of 30 positions shown, 19 images · IV contrast (iopamidol)
Comparison: Brain MRI 08/27/2016

CLINICAL DATA: Pain and swelling since after wisdom tooth
extraction.

EXAM:
CT MAXILLOFACIAL WITH CONTRAST
TECHNIQUE: Multidetector CT imaging of the maxillofacial structures was
performed with intravenous contrast. Multiplanar CT image
reconstructions were also generated.
CONTRAST:  75mL H3WB9L-HEE IOPAMIDOL (H3WB9L-HEE) INJECTION 61%

[Series 5: maxofacial -soft · axial · 0.35mm/px · z∈[-248,-98]mm · 15 of 54 slices shown, 19 images]
[im 2/54  brain]
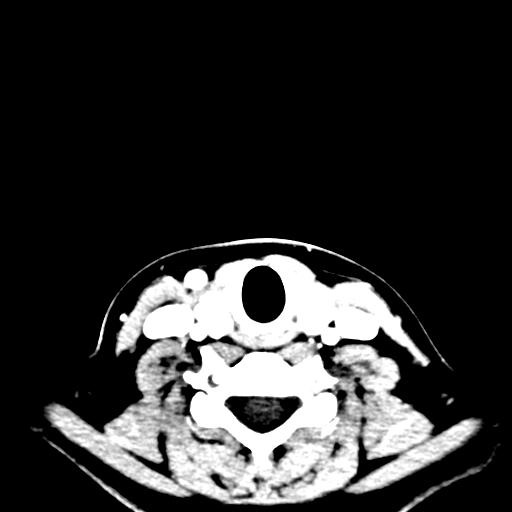
[im 2/54  bone]
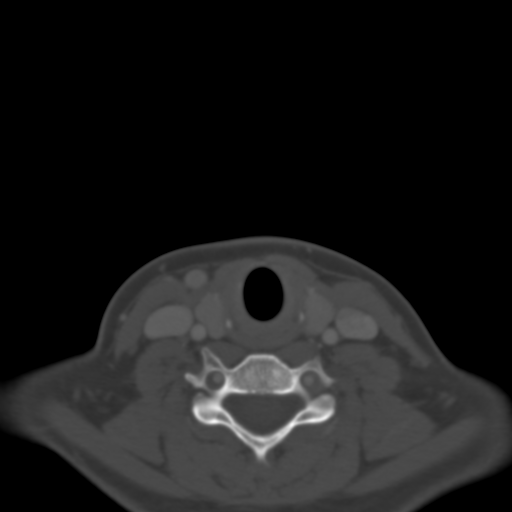
[im 6/54  bone]
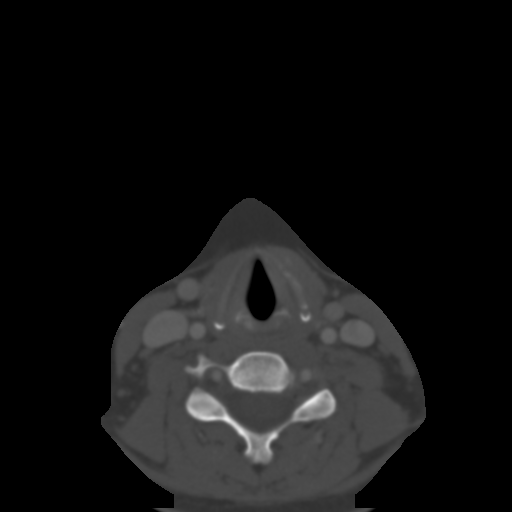
[im 10/54  bone]
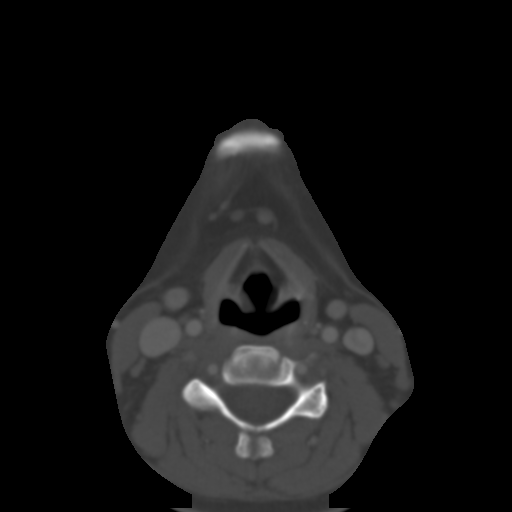
[im 13/54  bone]
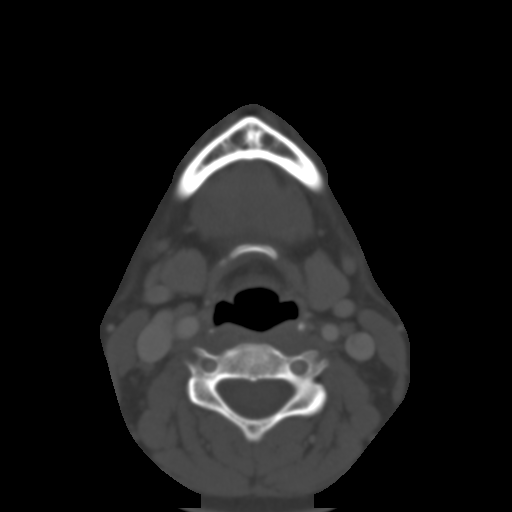
[im 17/54  brain]
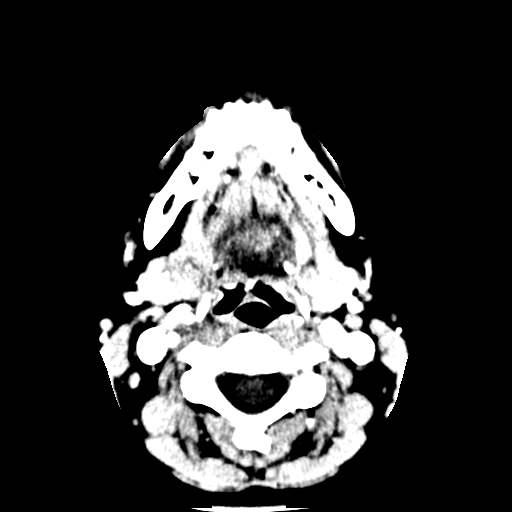
[im 17/54  bone]
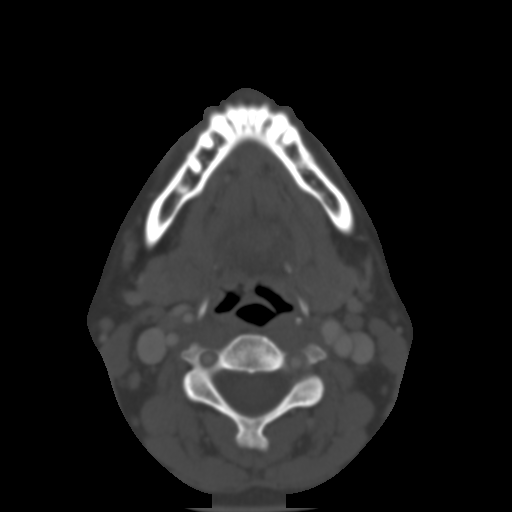
[im 21/54  bone]
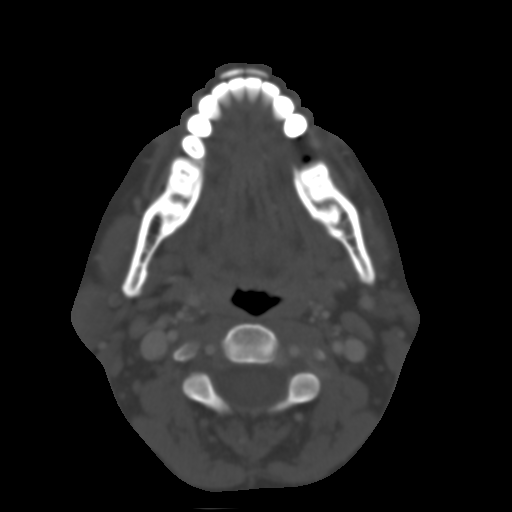
[im 24/54  bone]
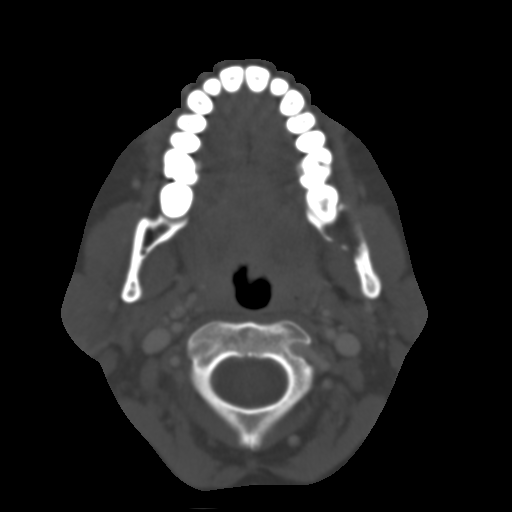
[im 28/54  bone]
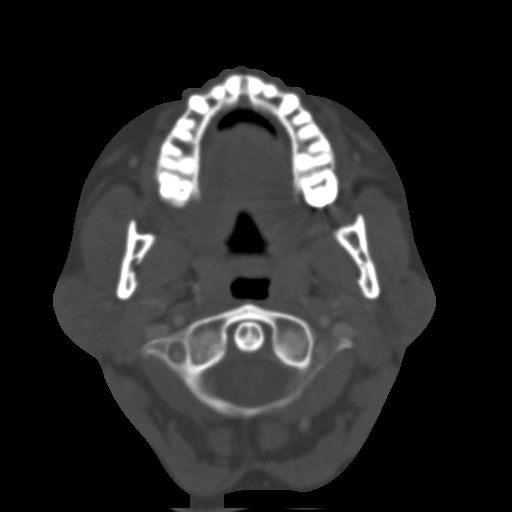
[im 30/54  brain]
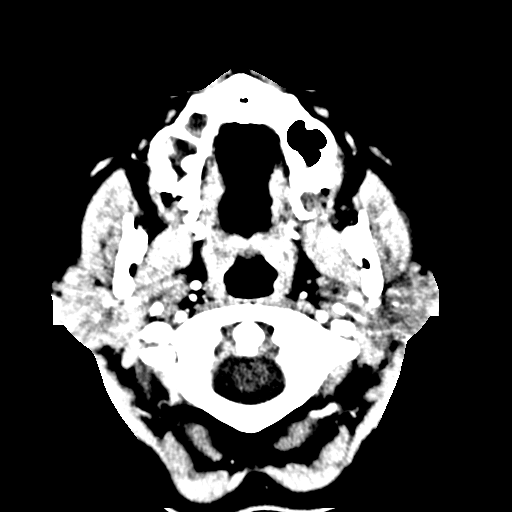
[im 30/54  bone]
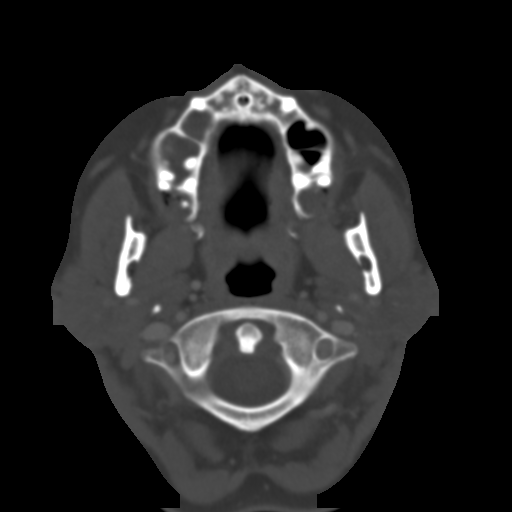
[im 33/54  bone]
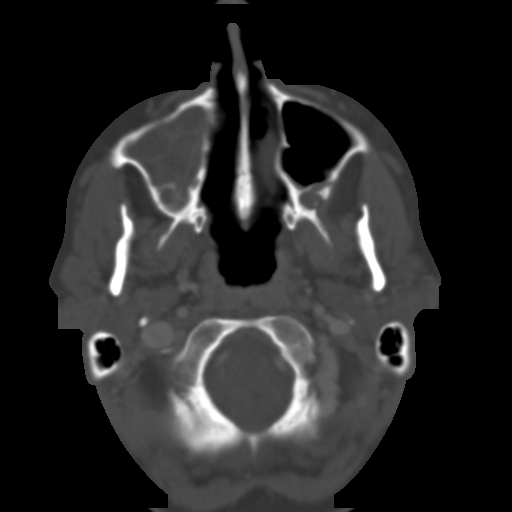
[im 37/54  bone]
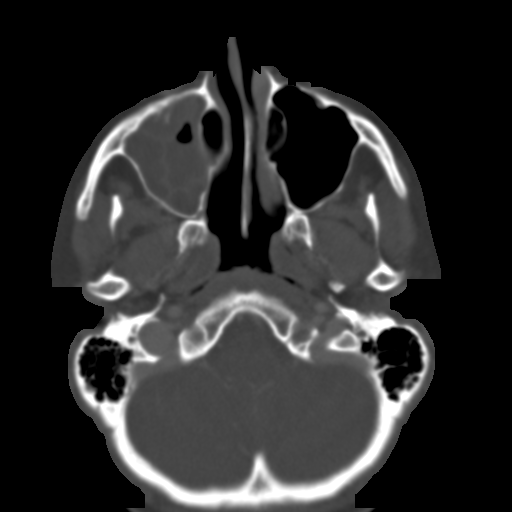
[im 41/54  bone]
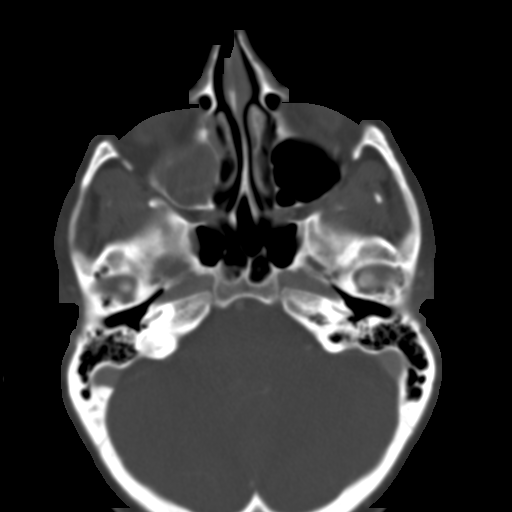
[im 44/54  brain]
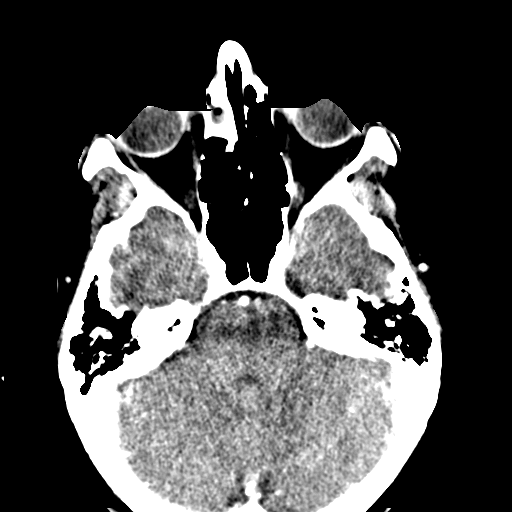
[im 44/54  bone]
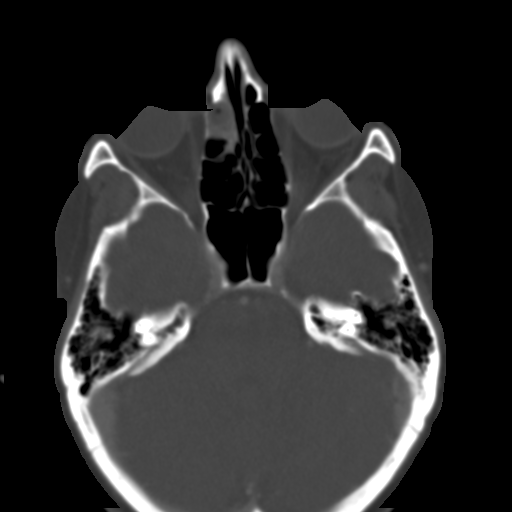
[im 48/54  bone]
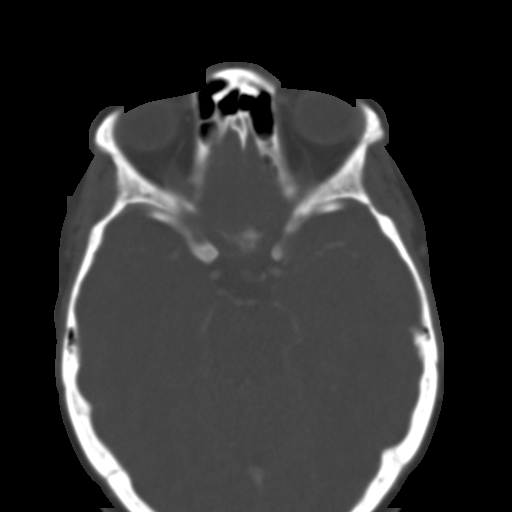
[im 52/54  bone]
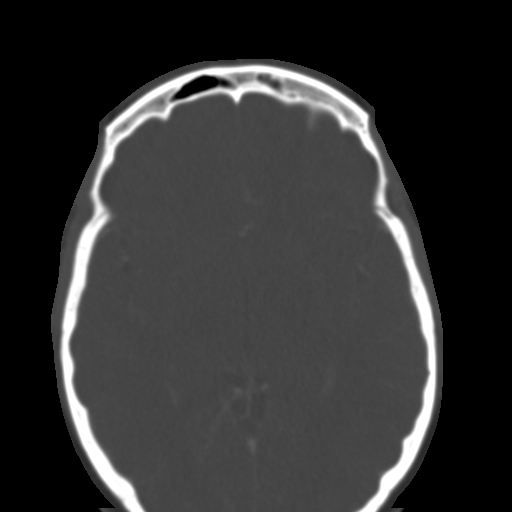

[15 of 30 positions shown; findings below may reference images not displayed]

FINDINGS: Osseous:

--Complex facial fracture types: No [place], zygomaticomaxillary
complex or nasoorbitoethmoidal fracture.

--Simple fracture types: None.

--Mandible, hard palate and teeth: Status post extraction of the
third molars. There is a small amount of gas within both sockets of
the third maxillary molars. The apices of teeth 2-4 are in
communication with the right maxillary sinus. The roots of teeth 6
and 7 are also protruding into the lower maxillary sinus. There is
no inflammatory infiltration of the subcutaneous fat overlying the
maxilla or mandible. No fluid collection.

Orbits: The globes appear intact. Normal appearance of the intra-
and extraconal fat. Symmetric extraocular muscles.

Sinuses: The right maxillary sinus is filled with low-density
material with multiple high-density septations, likely a combination
of retention cyst/ polypoid mucosal thickening and inspissated
secretions. The right ostiomeatal complex is occluded.

Soft tissues: Normal visualized extracranial soft tissues.

Limited intracranial: Normal.
IMPRESSION: 1. Status post extraction of the third mandibular and maxillary
molars. Small amount of gas within both maxillary extractions
sockets without associated abscess or drainable fluid collection.
2. Large amount of mucosal edema inspissated secretions in the right
maxillary sinus, which is in communication with the roots of teeth
2-4 and within the right maxillary third molar extraction site,
likely indicating odontogenic cellulitis, possibly acutely worsened
in the postoperative period.

## 2019-07-19 ENCOUNTER — Other Ambulatory Visit: Payer: Self-pay | Admitting: Neurology

## 2019-07-23 ENCOUNTER — Telehealth: Payer: Self-pay | Admitting: *Deleted

## 2019-07-23 MED ORDER — AJOVY 225 MG/1.5ML ~~LOC~~ SOSY: 225.0000 mg | PREFILLED_SYRINGE | SUBCUTANEOUS | 12 refills | Status: DC

## 2019-07-23 NOTE — Telephone Encounter (Signed)
Sent in new Rx to [facility] and called patient to make her aware. Patient verbalized understanding, appreciation.

## 2019-07-23 NOTE — Telephone Encounter (Signed)
Yes please call them and change it thank you! 12 refills.

## 2019-07-23 NOTE — Telephone Encounter (Signed)
Called [facility], [phone], spoke with Jana Half to check on PA for [name]. Patient has Medicaid Family planning which won't pay for medicine. I called [facility], spoke to [place] and stated her insurance is denying [name]. Pharmacist stated she did not get [name] refilled at their pharmacy, and she thinks insurance is denying because pt probably got it refilled early Sept. They can refill on Oct 4th.  I called patient who stated she last picked up [name], 1 injection [facility] June. She cannot afford to pick up more than one at a time. She's not injected since June. She uses the $5 discount card from online. She wants all Rx to go to [facility]. I advised her Dr Jaynee Eagles refilled to [facility] on 07/19/19, and Rx is for disp #3, take 3 every 3 months. She stated she's never taken it that way, only one per month. I advised her I'll call pharmacy back and ask to run Rx, then call her. Patient verbalized understanding, appreciation. Called and spoke with [facility] again. She stated they have Rx sent on 07/19/19 but due to the way [facility] filed claim, insurance won't fill until Oct 4th. She recommended Rx be changed to reflect how patient is actually taking medication. Will discuss with Dr Jaynee Eagles.

## 2019-07-31 ENCOUNTER — Other Ambulatory Visit: Payer: Self-pay | Admitting: Nurse Practitioner

## 2019-07-31 DIAGNOSIS — Z1231 Encounter for screening mammogram for malignant neoplasm of breast: Secondary | ICD-10-CM

## 2019-08-20 ENCOUNTER — Ambulatory Visit
Admission: RE | Admit: 2019-08-20 | Discharge: 2019-08-20 | Disposition: A | Discharge status: Home / Self Care | Payer: PRIVATE HEALTH INSURANCE | Source: Ambulatory Visit | Attending: Nurse Practitioner | Admitting: Nurse Practitioner

## 2019-08-20 DIAGNOSIS — Z1231 Encounter for screening mammogram for malignant neoplasm of breast: Secondary | ICD-10-CM | POA: Diagnosis present

## 2019-08-21 ENCOUNTER — Other Ambulatory Visit: Payer: Self-pay | Admitting: Nurse Practitioner

## 2019-08-21 DIAGNOSIS — R928 Other abnormal and inconclusive findings on diagnostic imaging of breast: Secondary | ICD-10-CM

## 2019-08-28 ENCOUNTER — Ambulatory Visit
Admission: RE | Admit: 2019-08-28 | Discharge: 2019-08-28 | Disposition: A | Discharge status: Home / Self Care | Payer: PRIVATE HEALTH INSURANCE | Source: Ambulatory Visit | Attending: Nurse Practitioner | Admitting: Nurse Practitioner

## 2019-08-28 DIAGNOSIS — R928 Other abnormal and inconclusive findings on diagnostic imaging of breast: Secondary | ICD-10-CM | POA: Diagnosis present

## 2019-08-29 ENCOUNTER — Other Ambulatory Visit: Payer: Self-pay | Admitting: Nurse Practitioner

## 2019-08-29 DIAGNOSIS — R928 Other abnormal and inconclusive findings on diagnostic imaging of breast: Secondary | ICD-10-CM

## 2019-09-04 ENCOUNTER — Ambulatory Visit
Admission: RE | Admit: 2019-09-04 | Discharge: 2019-09-04 | Disposition: A | Discharge status: Home / Self Care | Payer: PRIVATE HEALTH INSURANCE | Source: Ambulatory Visit | Attending: Nurse Practitioner | Admitting: Nurse Practitioner

## 2019-09-04 DIAGNOSIS — R928 Other abnormal and inconclusive findings on diagnostic imaging of breast: Secondary | ICD-10-CM | POA: Insufficient documentation

## 2019-09-04 HISTORY — PX: BREAST BIOPSY: SHX20

## 2019-09-05 LAB — SURGICAL PATHOLOGY

## 2019-09-11 ENCOUNTER — Ambulatory Visit: Payer: Self-pay | Admitting: Surgery

## 2019-09-11 ENCOUNTER — Other Ambulatory Visit: Payer: Self-pay | Admitting: Surgery

## 2019-09-11 DIAGNOSIS — N6489 Other specified disorders of breast: Secondary | ICD-10-CM

## 2019-09-11 NOTE — H&P (Signed)
Subjective:   CC: Radial scar of breast [N64.89] HPI:  Kelly Dorsey is a 44 y.o. female who was referred by Kelly Dorsey, * for evaluation of above. Change was noted on last screening mammogram. Patient does routinely do self breast exams.  Patient has not noted a change on breast exam. Age of menarche was 72.   Last menstrual period was 2015. Patient admits to hormonal therapy, depo shot and nuvaring. Patient is G4P3. Age of first live birth was 60. Patient did not breast feed. Patient denies nipple discharge. Patient reports previous breast biopsy. Patient denies a personal history of breast cancer.   Past Medical History:  has a past medical history of Anxiety and Sleep apnea.  Past Surgical History:  has a past surgical history that includes Appendectomy (1984); tubal ablasion (2006); and Mastectomy partial / lumpectomy (Left, 2018).  Family History: breast CA- PGMunknown age.  Paternal uncle with throat CA, bone Cancer in aunt, stomach CA in uncle, liver cancer in uncle  Breast CA in aunt and first cousin, paternal  Maternal aunt with breast CA  Social History:  reports that she has been smoking cigarettes. She has been smoking about 0.50 packs per day. She has never used smokeless tobacco. She reports previous alcohol use. She reports that she does not use drugs.  Current Medications: has a current medication list which includes the following prescription(s): adderall xr, clonazepam, famotidine, rizatriptan, and topiramate.  Allergies:       Allergies as of 09/10/2019 - Reviewed 09/10/2019  Allergen Reaction Noted  . Other Nausea 07/09/2015    ROS:  A 15 point review of systems was performed and was negative except as noted in HPI   Objective:     BP (!) 132/92   Pulse 102   Ht 170.2 cm (5\' 7" )   Wt 71.4 kg (157 lb 8 oz)   BMI 24.67 kg/m   Constitutional :  alert, appears stated age, cooperative and no distress  Lymphatics/Throat:  no asymmetry,  masses, or scars  Respiratory:  clear to auscultation bilaterally  Cardiovascular:  regular rate and rhythm  Gastrointestinal: soft, non-tender; bowel sounds normal; no masses,  no organomegaly.   Musculoskeletal: Steady gait and movement  Skin: Cool and moist, left breast surgical scars  Psychiatric: Normal affect, non-agitated, not confused  Breast:  Chaperone present for exam.  breasts appear normal, except for needle biopsy changes on right. no suspicious masses, no skin or nipple changes or axillary nodes.    LABS:  SURGICAL PATHOLOGY SURGICAL PATHOLOGY CASE: [phone] PATIENT: Kelly Dorsey Surgical Pathology Report     Specimen Submitted: A. Breast, right, upper inner; biopsy  Clinical History: Small area of distortion medial right breast, no definite mass on mammo or [place]. Clinical impression - possible complex sclerosing lesion, r/o malignancy. Post-biopsy mammograms show appropriate positioning of the X-shaped marker clip at the site of biopsy upper inner right breast.     DIAGNOSIS: A. BREAST, RIGHT, UPPER INNER; STEREOTACTIC-GUIDED CORE BIOPSY: - FEATURES OF RADIAL SCAR. 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 - NEGATIVE FOR ATYPIA AND MALIGNANCY IN THIS SAMPLE.  GROSS DESCRIPTION: A. Labeled: Right upper inner breast stereotactic core biopsy-distortion Received: in a formalin-filled CoreTainer transport system Accompanying specimen radiograph: No Time / date in fixative: Tissue procedure time 8:16 AM, tissue put in formalin time 8:20 AM 09/04/2019 Cold ischemic time: 4 minutes Total fixation time: 9 hours Core pieces: Multiple Measurement: From 0.5 to  1.1 cm in length and 0.1 cm in width Description / comments: Fibrofatty soft tissue cores Inked: Green Entirely submitted. Block summary: 1-6-entirely submitted breast cores   Final Diagnosis performed by Bryan Lemma, MD.   Electronically signed 09/05/2019 1:27:33PM The electronic signature indicates that the named Attending Pathologist has evaluated the specimen Technical component performed at [facility], [address], [place], [place] [address] Lab: [phone] Dir: Rush Farmer, MD, MMM  Professional component performed at [facility], [facility], [place], [place],  [address] Lab: [phone] Dir: Dellia Nims. Rubinas, MD     RADS: CLINICAL DATA: Patient was called back from screening mammogram for possible distortion in the right breast.  EXAM: DIGITAL DIAGNOSTIC RIGHT MAMMOGRAM WITH CAD AND TOMO  ULTRASOUND RIGHT BREAST  COMPARISON: Previous exam(s).  ACR Breast Density Category b: There are scattered areas of fibroglandular density.  FINDINGS: Additional imaging of the right breast was performed. There is persistent distortion in the medial aspect of the breast. There are no malignant type microcalcifications.  Mammographic images were processed with CAD.  On physical exam, I do not palpate a discrete mass in the medial aspect of the right breast.  Targeted ultrasound is performed, showing normal tissue in the medial aspect of the right breast. No solid or cystic mass, abnormal shadowing or distortion visualized. Sonographic evaluation of the right axilla does not show any enlarged adenopathy.  IMPRESSION: Right breast distortion.  RECOMMENDATION: Stereotactic biopsy of the distortion in the medial aspect of the right breast is recommended.  I have discussed the findings and recommendations with the patient. If applicable, a reminder letter will be sent to the patient regarding the next appointment.  BI-RADS CATEGORY 4: Suspicious.   Electronically Signed  By: Lillia Mountain M.D.  On: 08/28/2019 11:48  Assessment:   Radial scar of breast [N64.89]  smoking  Plan:     1. Radial scar of breast [N64.89]  Discussed the risk of  surgery including recurrence, chronic pain, post-op infxn, poor/delayed wound healing, poor cosmesis, seroma, hematoma formation, and possible re-operation to address said risks. The risks of general anesthetic, if used, includes MI, CVA, sudden death or even reaction to anesthetic medications also discussed.  Typical post-op recovery time and possbility of activity restrictions were also discussed.  Alternatives include continued observation.  Benefits include possible symptom relief, pathologic evaluation, and/or curative excision.   The patient verbalized understanding and all questions were answered to the patient's satisfaction.  2. Patient has elected to proceed with surgical treatment to ensure no sampling error from biopsy.  Procedure will be scheduled.  Written consent was obtained.  Strong family history of CA in second degree relatives as noted above.  Pt also requested if genetic testing will be available, so will ask genetic counselor to see if they can eval for appropriateness.  Also discussed smoking cessation since it will increase perioperative complications.  She is currently 1/2PPD smoker.  Actively trying to quit with patches.

## 2019-09-11 NOTE — H&P (View-Only) (Signed)
Subjective:   CC: Radial scar of breast [N64.89] HPI:  Kelly Dorsey is a 43 y.o. female who was referred by Kelly Dorsey, * for evaluation of above. Change was noted on last screening mammogram. Patient does routinely do self breast exams.  Patient has not noted a change on breast exam. Age of menarche was 48.   Last menstrual period was 2015. Patient admits to hormonal therapy, depo shot and nuvaring. Patient is G4P3. Age of first live birth was 2. Patient did not breast feed. Patient denies nipple discharge. Patient reports previous breast biopsy. Patient denies a personal history of breast cancer.   Past Medical History:  has a past medical history of Anxiety and Sleep apnea.  Past Surgical History:  has a past surgical history that includes Appendectomy (1984); tubal ablasion (2006); and Mastectomy partial / lumpectomy (Left, 2018).  Family History: breast CA- PGMunknown age.  Paternal uncle with throat CA, bone Cancer in aunt, stomach CA in uncle, liver cancer in uncle  Breast CA in aunt and first cousin, paternal  Maternal aunt with breast CA  Social History:  reports that she has been smoking cigarettes. She has been smoking about 0.50 packs per day. She has never used smokeless tobacco. She reports previous alcohol use. She reports that she does not use drugs.  Current Medications: has a current medication list which includes the following prescription(s): adderall xr, clonazepam, famotidine, rizatriptan, and topiramate.  Allergies:       Allergies as of 09/10/2019 - Reviewed 09/10/2019  Allergen Reaction Noted  . Other Nausea 07/09/2015    ROS:  A 15 point review of systems was performed and was negative except as noted in HPI   Objective:     BP (!) 132/92   Pulse 102   Ht 170.2 cm (5\' 7" )   Wt 71.4 kg (157 lb 8 oz)   BMI 24.67 kg/m   Constitutional :  alert, appears stated age, cooperative and no distress  Lymphatics/Throat:  no asymmetry,  masses, or scars  Respiratory:  clear to auscultation bilaterally  Cardiovascular:  regular rate and rhythm  Gastrointestinal: soft, non-tender; bowel sounds normal; no masses,  no organomegaly.   Musculoskeletal: Steady gait and movement  Skin: Cool and moist, left breast surgical scars  Psychiatric: Normal affect, non-agitated, not confused  Breast:  Chaperone present for exam.  breasts appear normal, except for needle biopsy changes on right. no suspicious masses, no skin or nipple changes or axillary nodes.    LABS:  SURGICAL PATHOLOGY SURGICAL PATHOLOGY CASE: [id number] [phone] PATIENT: Kelly Dorsey Surgical Pathology Report     Specimen Submitted: A. Breast, right, upper inner; biopsy  Clinical History: Small area of distortion medial right breast, no definite mass on mammo or [place]. Clinical impression - possible complex sclerosing lesion, r/o malignancy. Post-biopsy mammograms show appropriate positioning of the X-shaped marker clip at the site of biopsy upper inner right breast.     DIAGNOSIS: A. BREAST, RIGHT, UPPER INNER; STEREOTACTIC-GUIDED CORE BIOPSY: - FEATURES OF RADIAL SCAR. 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 - NEGATIVE FOR ATYPIA AND MALIGNANCY IN THIS SAMPLE.  GROSS DESCRIPTION: A. Labeled: Right upper inner breast stereotactic core biopsy-distortion Received: in a formalin-filled CoreTainer transport system Accompanying specimen radiograph: No Time / date in fixative: Tissue procedure time 8:16 AM, tissue put in formalin time 8:20 AM 09/04/2019 Cold ischemic time: 4 minutes Total fixation time: 9 hours Core pieces: Multiple Measurement: From 0.5 to  1.1 cm in length and 0.1 cm in width Description / comments: Fibrofatty soft tissue cores Inked: Green Entirely submitted. Block summary: 1-6-entirely submitted breast cores   Final Diagnosis performed by Kelly Lemma, MD.   Electronically signed 09/05/2019 1:27:33PM The electronic signature indicates that the named Attending Pathologist has evaluated the specimen Technical component performed at [facility], [address] [place], [place] [address] Lab: [phone] Dir: Kelly Farmer, MD, MMM  Professional component performed at [facility], [facility], [place], [place], [place] [address] Lab: [phone] Dir: Kelly Nims. Rubinas, MD     RADS: CLINICAL DATA: Patient was called back from screening mammogram for possible distortion in the right breast.  EXAM: DIGITAL DIAGNOSTIC RIGHT MAMMOGRAM WITH CAD AND TOMO  ULTRASOUND RIGHT BREAST  COMPARISON: Previous exam(s).  ACR Breast Density Category b: There are scattered areas of fibroglandular density.  FINDINGS: Additional imaging of the right breast was performed. There is persistent distortion in the medial aspect of the breast. There are no malignant type microcalcifications.  Mammographic images were processed with CAD.  On physical exam, I do not palpate a discrete mass in the medial aspect of the right breast.  Targeted ultrasound is performed, showing normal tissue in the medial aspect of the right breast. No solid or cystic mass, abnormal shadowing or distortion visualized. Sonographic evaluation of the right axilla does not show any enlarged adenopathy.  IMPRESSION: Right breast distortion.  RECOMMENDATION: Stereotactic biopsy of the distortion in the medial aspect of the right breast is recommended.  I have discussed the findings and recommendations with the patient. If applicable, a reminder letter will be sent to the patient regarding the next appointment.  BI-RADS CATEGORY 4: Suspicious.   Electronically Signed  By: Kelly Dorsey M.D.  On: 08/28/2019 11:48  Assessment:   Radial scar of breast [N64.89]  smoking  Plan:     1. Radial scar of breast [N64.89]  Discussed the risk of  surgery including recurrence, chronic pain, post-op infxn, poor/delayed wound healing, poor cosmesis, seroma, hematoma formation, and possible re-operation to address said risks. The risks of general anesthetic, if used, includes MI, CVA, sudden death or even reaction to anesthetic medications also discussed.  Typical post-op recovery time and possbility of activity restrictions were also discussed.  Alternatives include continued observation.  Benefits include possible symptom relief, pathologic evaluation, and/or curative excision.   The patient verbalized understanding and all questions were answered to the patient's satisfaction.  2. Patient has elected to proceed with surgical treatment to ensure no sampling error from biopsy.  Procedure will be scheduled.  Written consent was obtained.  Strong family history of CA in second degree relatives as noted above.  Pt also requested if genetic testing will be available, so will ask genetic counselor to see if they can eval for appropriateness.  Also discussed smoking cessation since it will increase perioperative complications.  She is currently 1/2PPD smoker.  Actively trying to quit with patches.

## 2019-09-25 ENCOUNTER — Inpatient Hospital Stay
Admission: RE | Admit: 2019-09-25 | Discharge: 2019-09-25 | Disposition: A | Discharge status: Home / Self Care | Payer: PRIVATE HEALTH INSURANCE | Source: Ambulatory Visit

## 2019-09-25 NOTE — Patient Instructions (Signed)
Your COVID swab is scheduled on: Monday 09/30/2019.  Drive up in front of [place] any time 8:00-10:30 am and remain in your vehicle.  Your procedure is scheduled on: Thursday 10/03/2019 @ 7:45 am Report to [facility]  Remember: Instructions that are not followed completely may result in serious medical risk, up to and including death, or upon the discretion of your surgeon and anesthesiologist your surgery may need to be rescheduled.    __x__ 1. Do not eat food (including mints, candies, chewing gum) after midnight the night before your procedure. You may drink clear liquids up to 2 hours before you are scheduled to arrive at the hospital for your procedure.  Do not drink anything within 2 hours of your scheduled arrival to the hospital.  Approved clear liquids:  --Water or Apple juice without pulp  --Clear carbohydrate beverage such as Gatorade or Powerade  --Black Coffee or Clear Tea (No milk, no creamers, do not add anything to the coffee or tea)    __x__ 2. No Alcohol for 24 hours before or after surgery.   __x__ 3. No Smoking or e-cigarettes for 24 hours before surgery.  Do not use any chewable tobacco products for at least 6 hours before surgery.   __x__ 4. Notify your doctor if there is any change in your medical condition (cold, fever, infections).   __x__ 5. On the morning of surgery brush your teeth with toothpaste and water.  You may rinse your mouth with mouthwash if you wish.  Do not swallow any toothpaste or mouthwash.  Please read over the following fact sheets that you were given:   [facility] Preparing for Surgery and/or MRSA Information    __x__ Use CHG Soap as directed on instruction sheet.   Do not wear jewelry, make-up, hairpins, clips or nail polish on the day of surgery.  Do not wear lotions, powders, deodorant, or perfumes.   Do not shave below the face/neck 48 hours prior to surgery.   Do not bring  valuables to the hospital.    [facility] is not responsible for any belongings or valuables.               Contacts, dentures or bridgework may not be worn into surgery.  For patients admitted to the hospital, discharge time is determined by your treatment team.  For patients discharged on the day of surgery, you will NOT be permitted to drive yourself home.  You must have a responsible adult with you for 24 hours after surgery.  __x__ Take these medicines on the morning of surgery with a SMALL SIP OF WATER:  1.   2.  3.  4.  5.  6.   __x__ Follow recommendations from Cardiologist, Pulmonologist or PCP regarding stopping blood thinners such as Aspirin, Coumadin, Plavix, Eliquis, Effient, Pradaxa, and Pletal.  __x__ STARTING TODAY: Do not take any Anti-inflammatories such as Advil, Ibuprofen, Motrin, Aleve, Naproxen, Naprosyn, BC/Goodies powders or aspirin products. You may continue to take Tylenol and Celebrex.   __x__ STARTING TODAY: Do not take any over the counter supplements until after surgery. You may continue to take Vitamin D, Vitamin B, and multivitamin.

## 2019-09-30 ENCOUNTER — Other Ambulatory Visit: Payer: Self-pay

## 2019-09-30 ENCOUNTER — Other Ambulatory Visit
Admission: RE | Admit: 2019-09-30 | Discharge: 2019-09-30 | Disposition: A | Discharge status: Home / Self Care | Payer: PRIVATE HEALTH INSURANCE | Source: Ambulatory Visit | Attending: Surgery | Admitting: Surgery

## 2019-09-30 ENCOUNTER — Encounter
Admission: RE | Admit: 2019-09-30 | Discharge: 2019-09-30 | Disposition: A | Discharge status: Home / Self Care | Payer: PRIVATE HEALTH INSURANCE | Source: Ambulatory Visit | Attending: Surgery | Admitting: Surgery

## 2019-09-30 DIAGNOSIS — Z01812 Encounter for preprocedural laboratory examination: Secondary | ICD-10-CM | POA: Insufficient documentation

## 2019-09-30 DIAGNOSIS — Z20828 Contact with and (suspected) exposure to other viral communicable diseases: Secondary | ICD-10-CM | POA: Diagnosis not present

## 2019-09-30 LAB — SARS CORONAVIRUS 2 (TAT 6-24 HRS): SARS Coronavirus 2: NEGATIVE

## 2019-09-30 NOTE — Patient Instructions (Addendum)
Your procedure is scheduled [id number] 12/10  Report to Day Surgery. To find out your arrival time please call [phone] between [facility]. 12/9  Remember: Instructions that are not followed completely may result in serious medical risk,  up to and including death, or upon the discretion of your surgeon and anesthesiologist your  surgery may need to be rescheduled.     _X__ 1. Do not eat food after midnight the night before your procedure.                 No gum chewing or hard candies. You may drink clear liquids up to 2 hours                 before you are scheduled to arrive for your surgery- DO not drink clear                 liquids within 2 hours of the start of your surgery.                 Clear Liquids include:  water, apple juice without pulp, clear carbohydrate                 drink such as Clearfast of Gatorade, Black Coffee or Tea (Do not add                 anything to coffee or tea).  __X__2.  On the morning of surgery brush your teeth with toothpaste and water, you                may rinse your mouth with mouthwash if you wish.  Do not swallow any toothpaste of mouthwash.     ___ 3.  No Alcohol for 24 hours before or after surgery.   _X__ 4.  Do Not Smoke or use e-cigarettes For 24 Hours Prior to Your Surgery.                 Do not use any chewable tobacco products for at least 6 hours prior to                 surgery.  ____  5.  Bring all medications with you on the day of surgery if instructed.   __x__  6.  Notify your doctor if there is any change in your medical condition      (cold, fever, infections).     Do not wear jewelry, make-up, hairpins, clips or nail polish. Do not wear lotions, powders, or perfumes. You may wear deodorant. Do not shave 48 hours prior to surgery. Men may shave face and neck. Do not bring valuables to the hospital.    [facility] is not responsible for any belongings or valuables.  Contacts, dentures  or bridgework may not be worn into surgery. Leave your suitcase in the car. After surgery it may be brought to your room. For patients admitted to the hospital, discharge time is determined by your treatment team.   Patients discharged the day of surgery will not be allowed to drive home.   Please read over the following fact sheets that you were given:    __x__ Take these medicines the morning of surgery with A SIP OF WATER:    1. famotidine (PEPCID) 20 MG tablet dose the night before and morning of surgery  2.   3.   4.  5.  6.  ____ Fleet Enema (as directed)   __x__ Use  CHG Soap as directed  ____ Use inhalers on the day of surgery  ____ Stop metformin 2 days prior to surgery    ____ Take 1/2 of usual insulin dose the night before surgery. No insulin the morning          of surgery.   ____ Stop Coumadin/Plavix/aspirin on   __x__ Stopped  Anti-inflammatories ibuprofen (ADVIL) 200 MG tablet already.  No aleve or aspirin either.  My take tylenol   ____ Stop supplements until after surgery.    ____ Bring C-Pap to the hospital.

## 2019-10-03 ENCOUNTER — Ambulatory Visit
Admission: RE | Admit: 2019-10-03 | Discharge: 2019-10-03 | Disposition: A | Discharge status: Home / Self Care | Payer: PRIVATE HEALTH INSURANCE | Attending: Surgery | Admitting: Surgery

## 2019-10-03 ENCOUNTER — Ambulatory Visit
Admission: RE | Admit: 2019-10-03 | Discharge: 2019-10-03 | Disposition: A | Discharge status: Home / Self Care | Payer: PRIVATE HEALTH INSURANCE | Source: Ambulatory Visit | Attending: Surgery | Admitting: Surgery

## 2019-10-03 ENCOUNTER — Encounter: Payer: Self-pay | Admitting: Surgery

## 2019-10-03 ENCOUNTER — Ambulatory Visit: Payer: PRIVATE HEALTH INSURANCE | Admitting: Anesthesiology

## 2019-10-03 ENCOUNTER — Other Ambulatory Visit: Payer: Self-pay

## 2019-10-03 ENCOUNTER — Ambulatory Visit: Payer: PRIVATE HEALTH INSURANCE

## 2019-10-03 ENCOUNTER — Encounter
Admission: RE | Disposition: A | Discharge status: Home / Self Care | Payer: Self-pay | Source: Home / Self Care | Attending: Surgery

## 2019-10-03 DIAGNOSIS — L905 Scar conditions and fibrosis of skin: Secondary | ICD-10-CM | POA: Insufficient documentation

## 2019-10-03 DIAGNOSIS — F419 Anxiety disorder, unspecified: Secondary | ICD-10-CM | POA: Insufficient documentation

## 2019-10-03 DIAGNOSIS — Z803 Family history of malignant neoplasm of breast: Secondary | ICD-10-CM | POA: Diagnosis not present

## 2019-10-03 DIAGNOSIS — K219 Gastro-esophageal reflux disease without esophagitis: Secondary | ICD-10-CM | POA: Diagnosis not present

## 2019-10-03 DIAGNOSIS — G4730 Sleep apnea, unspecified: Secondary | ICD-10-CM | POA: Diagnosis not present

## 2019-10-03 DIAGNOSIS — Z79899 Other long term (current) drug therapy: Secondary | ICD-10-CM | POA: Diagnosis not present

## 2019-10-03 DIAGNOSIS — G43909 Migraine, unspecified, not intractable, without status migrainosus: Secondary | ICD-10-CM | POA: Diagnosis not present

## 2019-10-03 DIAGNOSIS — N6489 Other specified disorders of breast: Secondary | ICD-10-CM

## 2019-10-03 DIAGNOSIS — F17210 Nicotine dependence, cigarettes, uncomplicated: Secondary | ICD-10-CM | POA: Insufficient documentation

## 2019-10-03 HISTORY — PX: PARTIAL MASTECTOMY WITH NEEDLE LOCALIZATION: SHX6008

## 2019-10-03 HISTORY — PX: BREAST LUMPECTOMY: SHX2

## 2019-10-03 LAB — POCT PREGNANCY, URINE
Preg Test, Ur: NEGATIVE
Preg Test, Ur: NEGATIVE

## 2019-10-03 SURGERY — PARTIAL MASTECTOMY WITH NEEDLE LOCALIZATION
Anesthesia: General | Laterality: Right

## 2019-10-03 MED ORDER — BUPIVACAINE-EPINEPHRINE (PF) 0.5% -1:200000 IJ SOLN
INTRAMUSCULAR | Status: AC
  Filled 2019-10-03: qty 30

## 2019-10-03 MED ORDER — DOCUSATE SODIUM 100 MG PO CAPS: 100.0000 mg | ORAL_CAPSULE | Freq: Two times a day (BID) | ORAL | 0 refills | Status: AC | PRN

## 2019-10-03 MED ORDER — PHENYLEPHRINE HCL (PRESSORS) 10 MG/ML IV SOLN
INTRAVENOUS | Status: DC | PRN
  Administered 2019-10-03: 100 ug via INTRAVENOUS

## 2019-10-03 MED ORDER — CELECOXIB 200 MG PO CAPS
ORAL_CAPSULE | ORAL | Status: AC
  Administered 2019-10-03: 200 mg via ORAL
  Filled 2019-10-03: qty 1

## 2019-10-03 MED ORDER — OXYCODONE-ACETAMINOPHEN 5-325 MG PO TABS: 1.0000 | ORAL_TABLET | ORAL | 0 refills | Status: DC | PRN

## 2019-10-03 MED ORDER — CHLORHEXIDINE GLUCONATE CLOTH 2 % EX PADS
6.0000 | MEDICATED_PAD | Freq: Once | CUTANEOUS | Status: AC
  Administered 2019-10-03: 6 via TOPICAL

## 2019-10-03 MED ORDER — DEXAMETHASONE SODIUM PHOSPHATE 10 MG/ML IJ SOLN
INTRAMUSCULAR | Status: DC | PRN
  Administered 2019-10-03: 10 mg via INTRAVENOUS

## 2019-10-03 MED ORDER — ONDANSETRON HCL 4 MG/2ML IJ SOLN
INTRAMUSCULAR | Status: DC | PRN
  Administered 2019-10-03: 4 mg via INTRAVENOUS

## 2019-10-03 MED ORDER — ACETAMINOPHEN 500 MG PO TABS
ORAL_TABLET | ORAL | Status: AC
  Administered 2019-10-03: 1000 mg via ORAL
  Filled 2019-10-03: qty 2

## 2019-10-03 MED ORDER — ACETAMINOPHEN 325 MG PO TABS: 650.0000 mg | ORAL_TABLET | Freq: Three times a day (TID) | ORAL | 0 refills | Status: AC | PRN

## 2019-10-03 MED ORDER — PROPOFOL 10 MG/ML IV BOLUS
INTRAVENOUS | Status: DC | PRN
  Administered 2019-10-03: 140 mg via INTRAVENOUS

## 2019-10-03 MED ORDER — LIDOCAINE HCL 1 % IJ SOLN
INTRAMUSCULAR | Status: DC | PRN
  Administered 2019-10-03: 5 mL

## 2019-10-03 MED ORDER — CEFAZOLIN SODIUM-DEXTROSE 2-4 GM/100ML-% IV SOLN
INTRAVENOUS | Status: AC
  Filled 2019-10-03: qty 100

## 2019-10-03 MED ORDER — LACTATED RINGERS IV SOLN
INTRAVENOUS | Status: DC
  Administered 2019-10-03: 09:00:00 via INTRAVENOUS

## 2019-10-03 MED ORDER — FENTANYL CITRATE (PF) 100 MCG/2ML IJ SOLN
25.0000 ug | INTRAMUSCULAR | Status: DC | PRN
  Administered 2019-10-03 (×2): 25 ug via INTRAVENOUS

## 2019-10-03 MED ORDER — CEFAZOLIN SODIUM-DEXTROSE 2-4 GM/100ML-% IV SOLN
2.0000 g | INTRAVENOUS | Status: AC
  Administered 2019-10-03: 2 g via INTRAVENOUS

## 2019-10-03 MED ORDER — GABAPENTIN 300 MG PO CAPS
ORAL_CAPSULE | ORAL | Status: AC
  Administered 2019-10-03: 300 mg via ORAL
  Filled 2019-10-03: qty 1

## 2019-10-03 MED ORDER — CELECOXIB 200 MG PO CAPS: 200.0000 mg | ORAL_CAPSULE | ORAL | Status: AC

## 2019-10-03 MED ORDER — SUCCINYLCHOLINE CHLORIDE 20 MG/ML IJ SOLN
INTRAMUSCULAR | Status: DC | PRN
  Administered 2019-10-03: 100 mg via INTRAVENOUS

## 2019-10-03 MED ORDER — FENTANYL CITRATE (PF) 100 MCG/2ML IJ SOLN
INTRAMUSCULAR | Status: AC
  Filled 2019-10-03: qty 2

## 2019-10-03 MED ORDER — KETOROLAC TROMETHAMINE 30 MG/ML IJ SOLN
INTRAMUSCULAR | Status: DC | PRN
  Administered 2019-10-03: 30 mg via INTRAVENOUS

## 2019-10-03 MED ORDER — IBUPROFEN 800 MG PO TABS: 800.0000 mg | ORAL_TABLET | Freq: Three times a day (TID) | ORAL | 0 refills | Status: DC | PRN

## 2019-10-03 MED ORDER — BUPIVACAINE-EPINEPHRINE 0.5% -1:200000 IJ SOLN
INTRAMUSCULAR | Status: DC | PRN
  Administered 2019-10-03: 5 mL

## 2019-10-03 MED ORDER — ONDANSETRON HCL 4 MG/2ML IJ SOLN: 4.0000 mg | Freq: Once | INTRAMUSCULAR | Status: DC | PRN

## 2019-10-03 MED ORDER — FENTANYL CITRATE (PF) 100 MCG/2ML IJ SOLN
INTRAMUSCULAR | Status: DC | PRN
  Administered 2019-10-03: 25 ug via INTRAVENOUS
  Administered 2019-10-03: 50 ug via INTRAVENOUS
  Administered 2019-10-03: 25 ug via INTRAVENOUS

## 2019-10-03 MED ORDER — HYDROCODONE-ACETAMINOPHEN 5-325 MG PO TABS: 1.0000 | ORAL_TABLET | Freq: Four times a day (QID) | ORAL | 0 refills | Status: DC | PRN

## 2019-10-03 MED ORDER — ROCURONIUM BROMIDE 100 MG/10ML IV SOLN
INTRAVENOUS | Status: DC | PRN
  Administered 2019-10-03: 25 mg via INTRAVENOUS
  Administered 2019-10-03: 5 mg via INTRAVENOUS

## 2019-10-03 MED ORDER — ACETAMINOPHEN 500 MG PO TABS: 1000.0000 mg | ORAL_TABLET | ORAL | Status: AC

## 2019-10-03 MED ORDER — OXYCODONE-ACETAMINOPHEN 5-325 MG PO TABS
ORAL_TABLET | ORAL | Status: AC
  Filled 2019-10-03: qty 1

## 2019-10-03 MED ORDER — LIDOCAINE HCL (PF) 1 % IJ SOLN
INTRAMUSCULAR | Status: AC
  Filled 2019-10-03: qty 30

## 2019-10-03 MED ORDER — LIDOCAINE HCL (CARDIAC) PF 100 MG/5ML IV SOSY
PREFILLED_SYRINGE | INTRAVENOUS | Status: DC | PRN
  Administered 2019-10-03: 100 mg via INTRAVENOUS

## 2019-10-03 MED ORDER — OXYCODONE-ACETAMINOPHEN 5-325 MG PO TABS
1.0000 | ORAL_TABLET | Freq: Once | ORAL | Status: AC
  Administered 2019-10-03: 1 via ORAL

## 2019-10-03 MED ORDER — GABAPENTIN 300 MG PO CAPS: 300.0000 mg | ORAL_CAPSULE | ORAL | Status: AC

## 2019-10-03 MED ORDER — FENTANYL CITRATE (PF) 100 MCG/2ML IJ SOLN
INTRAMUSCULAR | Status: AC
  Administered 2019-10-03: 25 ug via INTRAVENOUS
  Filled 2019-10-03: qty 2

## 2019-10-03 MED ORDER — MIDAZOLAM HCL 2 MG/2ML IJ SOLN
INTRAMUSCULAR | Status: AC
  Filled 2019-10-03: qty 2

## 2019-10-03 MED ORDER — MIDAZOLAM HCL 2 MG/2ML IJ SOLN
INTRAMUSCULAR | Status: DC | PRN
  Administered 2019-10-03: 2 mg via INTRAVENOUS

## 2019-10-03 SURGICAL SUPPLY — 48 items
ADH SKN CLS APL DERMABOND .7 (GAUZE/BANDAGES/DRESSINGS) ×1
APL PRP STRL LF DISP 70% ISPRP (MISCELLANEOUS) ×1
APPLIER CLIP 11 MED OPEN (CLIP)
APR CLP MED 11 20 MLT OPN (CLIP)
BLADE SURG 15 STRL LF DISP TIS (BLADE) ×1 IMPLANT
BLADE SURG 15 STRL SS (BLADE) ×3
CANISTER SUCT 1200ML W/VALVE (MISCELLANEOUS) ×3 IMPLANT
CHLORAPREP W/TINT 26 (MISCELLANEOUS) ×3 IMPLANT
CLIP APPLIE 11 MED OPEN (CLIP) IMPLANT
CNTNR SPEC 2.5X3XGRAD LEK (MISCELLANEOUS) ×1
CONT SPEC 4OZ STER OR WHT (MISCELLANEOUS) ×2
CONT SPEC 4OZ STRL OR WHT (MISCELLANEOUS) ×1
CONTAINER SPEC 2.5X3XGRAD LEK (MISCELLANEOUS) ×1 IMPLANT
COVER WAND RF STERILE (DRAPES) ×3 IMPLANT
DERMABOND ADVANCED (GAUZE/BANDAGES/DRESSINGS) ×2
DERMABOND ADVANCED .7 DNX12 (GAUZE/BANDAGES/DRESSINGS) ×1 IMPLANT
DEVICE DUBIN SPECIMEN MAMMOGRA (MISCELLANEOUS) ×3 IMPLANT
DRAPE C-ARM XRAY 36X54 (DRAPES) ×2 IMPLANT
DRAPE FLUOR MINI C-ARM 54X84 (DRAPES) ×2 IMPLANT
DRAPE LAPAROTOMY 77X122 PED (DRAPES) ×3 IMPLANT
ELECT CAUTERY BLADE TIP 2.5 (TIP) ×3
ELECT REM PT RETURN 9FT ADLT (ELECTROSURGICAL) ×3
ELECTRODE CAUTERY BLDE TIP 2.5 (TIP) ×1 IMPLANT
ELECTRODE REM PT RTRN 9FT ADLT (ELECTROSURGICAL) ×1 IMPLANT
GLOVE BIOGEL PI IND STRL 7.0 (GLOVE) ×1 IMPLANT
GLOVE BIOGEL PI INDICATOR 7.0 (GLOVE) ×2
GLOVE SURG SYN 6.5 ES PF (GLOVE) ×6 IMPLANT
GLOVE SURG SYN 6.5 PF PI (GLOVE) ×2 IMPLANT
GOWN STRL REUS W/ TWL LRG LVL3 (GOWN DISPOSABLE) ×3 IMPLANT
GOWN STRL REUS W/TWL LRG LVL3 (GOWN DISPOSABLE) ×9
KIT MARKER MARGIN INK (KITS) ×3 IMPLANT
KIT TURNOVER KIT A (KITS) ×3 IMPLANT
LABEL OR SOLS (LABEL) ×3 IMPLANT
LIGHT WAVEGUIDE WIDE FLAT (MISCELLANEOUS) IMPLANT
NEEDLE HYPO 22GX1.5 SAFETY (NEEDLE) ×6 IMPLANT
PACK BASIN MINOR ARMC (MISCELLANEOUS) ×3 IMPLANT
SUT MNCRL 4-0 (SUTURE) ×6
SUT MNCRL 4-0 27XMFL (SUTURE) ×2
SUT SILK 2 0 (SUTURE) ×3
SUT SILK 2 0 SH (SUTURE) ×3 IMPLANT
SUT SILK 2-0 30XBRD TIE 12 (SUTURE) ×1 IMPLANT
SUT SILK 3 0 12 30 (SUTURE) ×3 IMPLANT
SUT VIC AB 3-0 SH 27 (SUTURE) ×6
SUT VIC AB 3-0 SH 27X BRD (SUTURE) ×2 IMPLANT
SUTURE MNCRL 4-0 27XMF (SUTURE) ×2 IMPLANT
SYR 20ML LL LF (SYRINGE) ×3 IMPLANT
SYR BULB IRRIG 60ML STRL (SYRINGE) ×3 IMPLANT
WATER STERILE IRR 1000ML POUR (IV SOLUTION) ×3 IMPLANT

## 2019-10-03 NOTE — Anesthesia Procedure Notes (Signed)
Procedure Name: Intubation Date/Time: 10/03/2019 12:14 PM Performed by: Aline Brochure, CRNA Pre-anesthesia Checklist: Patient identified, Emergency Drugs available, Suction available and Patient being monitored Patient Re-evaluated:Patient Re-evaluated prior to induction Oxygen Delivery Method: Circle system utilized Preoxygenation: Pre-oxygenation with 100% oxygen Induction Type: IV induction Ventilation: Mask ventilation without difficulty Laryngoscope Size: McGraph and 3 Grade View: Grade I Tube type: Oral Tube size: 7.0 mm Number of attempts: 1 Airway Equipment and Method: Stylet and Video-laryngoscopy Placement Confirmation: ETT inserted through vocal cords under direct vision,  positive ETCO2 and breath sounds checked- equal and bilateral Secured at: 20 cm Tube secured with: Tape Dental Injury: Teeth and Oropharynx as per pre-operative assessment  Difficulty Due To: Difficult Airway- due to anterior larynx

## 2019-10-03 NOTE — Anesthesia Preprocedure Evaluation (Signed)
Anesthesia Evaluation  Patient identified by MRN, date of birth, ID band Patient awake    Reviewed: Allergy & Precautions, NPO status , Patient's Chart, lab work & pertinent test results  History of Anesthesia Complications (+) history of anesthetic complications ("stopped breathing" while having wisdom teeth taken out)  Airway Mallampati: II  TM Distance: >3 FB Neck ROM: Full    Dental no notable dental hx.    Pulmonary neg sleep apnea, neg COPD, Current Smoker and Patient abstained from smoking.,    breath sounds clear to auscultation- rhonchi (-) wheezing      Cardiovascular Exercise Tolerance: Good (-) hypertension(-) CAD, (-) Past MI and (-) Cardiac Stents negative cardio ROS   Rhythm:Regular Rate:Normal - Systolic murmurs and - Diastolic murmurs    Neuro/Psych  Headaches, PSYCHIATRIC DISORDERS Anxiety Depression    GI/Hepatic Neg liver ROS, GERD  ,  Endo/Other  negative endocrine ROSneg diabetes  Renal/GU negative Renal ROS     Musculoskeletal negative musculoskeletal ROS (+)   Abdominal (+) - obese,   Peds negative pediatric ROS (+)  Hematology negative hematology ROS (+)   Anesthesia Other Findings Past Medical History: No date: Anxiety No date: Complication of anesthesia     Comment:  AFTER BTL IN 2006, BP BECAME VERY ELEVATED-WITH EPIDURAL              DURING DELIVERY, PT STARTED VOMITING AN SHAKING AND               EPIDURAL HAD TO BE STOPPED-PT STATES WITH WISDOM TEETH               SHE STOPPED BREATHING AND WAS SENT FOR SLEEP STUDY WHICH               WAS NEGATIVE No date: Depression No date: GERD (gastroesophageal reflux disease)     Comment:  OCC- NO MEDS No date: Migraine     Comment:  MIGRAINES No date: Thyroid nodule   Reproductive/Obstetrics                             Anesthesia Physical  Anesthesia Plan  ASA: II  Anesthesia Plan: General   Post-op Pain  Management:    Induction: Intravenous  PONV Risk Score and Plan: 1 and Ondansetron and Dexamethasone  Airway Management Planned: Oral ETT  Additional Equipment:   Intra-op Plan:   Post-operative Plan: Extubation in OR  Informed Consent: I have reviewed the patients History and Physical, chart, labs and discussed the procedure including the risks, benefits and alternatives for the proposed anesthesia with the patient or authorized representative who has indicated his/her understanding and acceptance.     Dental advisory given  Plan Discussed with: CRNA and Anesthesiologist  Anesthesia Plan Comments:         Anesthesia Quick Evaluation

## 2019-10-03 NOTE — Transfer of Care (Signed)
Immediate Anesthesia Transfer of Care Note  Patient: Kelly Dorsey  Procedure(s) Performed: PARTIAL MASTECTOMY WITH NEEDLE LOCALIZATION (Right )  Patient Location: PACU  Anesthesia Type:General  Level of Consciousness: sedated  Airway & Oxygen Therapy: Patient Spontanous Breathing and Patient connected to face mask oxygen  Post-op Assessment: Report given to RN and Post -op Vital signs reviewed and stable  Post vital signs: Reviewed and stable  Last Vitals:  Vitals Value Taken Time  BP 121/97 10/03/19 1405  Temp 36.1 C 10/03/19 1405  Pulse 74 10/03/19 1417  Resp 19 10/03/19 1417  SpO2 99 % 10/03/19 1417  Vitals shown include unvalidated device data.  Last Pain:  Vitals:   10/03/19 1405  TempSrc:   PainSc: 0-No pain         Complications: No apparent anesthesia complications

## 2019-10-03 NOTE — Discharge Instructions (Signed)
AMBULATORY SURGERY  DISCHARGE INSTRUCTIONS   1) The drugs that you were given will stay in your system until tomorrow so for the next 24 hours you should not:  A) Drive an automobile B) Make any legal decisions C) Drink any alcoholic beverage   2) You may resume regular meals tomorrow.  Today it is better to start with liquids and gradually work up to solid foods.  You may eat anything you prefer, but it is better to start with liquids, then soup and crackers, and gradually work up to solid foods.   3) Please notify your doctor immediately if you have any unusual bleeding, trouble breathing, redness and pain at the surgery site, drainage, fever, or pain not relieved by medication.    4) Additional Instructions:        Please contact your physician with any problems or Same Day Surgery at [phone], Monday through Friday 6 am to 4 pm, or [place] at [facility] number at [phone].Biopsy , Care After This sheet gives you information about how to care for yourself after your procedure. Your health care provider may also give you more specific instructions. If you have problems or questions, contact your health care provider. What can I expect after the procedure? After the procedure, it is common to have:  Soreness.  Bruising.  Itching. Follow these instructions at home: site care Follow instructions from your health care provider about how to take care of your site. Make sure you:  Wash your hands with soap and water before and after you change your bandage (dressing). If soap and water are not available, use hand sanitizer.  Leave stitches (sutures), skin glue, or adhesive strips in place. These skin closures may need to stay in place for 2 weeks or longer. If adhesive strip edges start to loosen and curl up, you may trim the loose edges. Do not remove adhesive strips completely unless your health care provider tells you to do that.  If the area bleeds or  bruises, apply gentle pressure for 10 minutes.  OK TO SHOWER IN 24HRS  Check your site every day for signs of infection. Check for:  Redness, swelling, or pain.  Fluid or blood.  Warmth.  Pus or a bad smell.  General instructions  Rest and then return to your normal activities as told by your health care provider.   tylenol and advil as needed for discomfort.  Please alternate between the two every four hours as needed for pain.     Use narcotics, if prescribed, only when tylenol and motrin is not enough to control pain.   325-650mg  every 8hrs to max of 3000mg /24hrs (including the 325mg  in every norco dose) for the tylenol.     Advil up to 800mg  per dose every 8hrs as needed for pain.    Keep all follow-up visits as told by your health care provider. This is important. Contact a health care provider if:  You have redness, swelling, or pain around your site.  You have fluid or blood coming from your site.  Your site feels warm to the touch.  You have pus or a bad smell coming from your site.  You have a fever.  Your sutures, skin glue, or adhesive strips loosen or come off sooner than expected. Get help right away if:  You have bleeding that does not stop with pressure or a dressing. Summary  After the procedure, it is common to have some soreness, bruising, and itching at the  site.  Follow instructions from your health care provider about how to take care of your site.  Check your site every day for signs of infection.  Contact a health care provider if you have redness, swelling, or pain around your site, or your site feels warm to the touch.  Keep all follow-up visits as told by your health care provider. This is important. This information is not intended to replace advice given to you by your health care provider. Make sure you discuss any questions you have with your health care provider. Document Released: 11/06/2015 Document Revised: 04/09/2018 Document  Reviewed: 04/09/2018 Elsevier Interactive Patient Education  2019 [employer]   5) The drugs that you were given will stay in your system until tomorrow so for the next 24 hours you should not:  D) Drive an automobile E) Make any legal decisions F) Drink any alcoholic beverage   6) You may resume regular meals tomorrow.  Today it is better to start with liquids and gradually work up to solid foods.  You may eat anything you prefer, but it is better to start with liquids, then soup and crackers, and gradually work up to solid foods.   7) Please notify your doctor immediately if you have any unusual bleeding, trouble breathing, redness and pain at the surgery site, drainage, fever, or pain not relieved by medication.    8) Additional Instructions:        Please contact your physician with any problems or Same Day Surgery at [phone], Monday through Friday 6 am to 4 pm, or [place] at [facility] number at [phone].

## 2019-10-03 NOTE — Anesthesia Post-op Follow-up Note (Signed)
Anesthesia QCDR form completed.        

## 2019-10-03 NOTE — Interval H&P Note (Signed)
History and Physical Interval Note:  10/03/2019 11:45 AM  Kelly Dorsey  has presented today for surgery, with the diagnosis of [place].  The various methods of treatment have been discussed with the patient and family. After consideration of risks, benefits and other options for treatment, the patient has consented to  Procedure(s): PARTIAL MASTECTOMY WITH NEEDLE LOCALIZATION (Right) as a surgical intervention.  The patient's history has been reviewed, patient examined, no change in status, stable for surgery.  I have reviewed the patient's chart and labs.  Questions were answered to the patient's satisfaction.     [name]

## 2019-10-04 NOTE — Op Note (Signed)
Preoperative diagnosis: Right breast radial scar Postoperative diagnosis: Same.   Procedure: needle-localized right breast lumpectomy                     Anesthesia: GETA  Surgeon: Dr. Benjamine Sprague  Wound Classification: Clean  Indications: Patient is a 43 y.o. female with a nonpalpable right breast mass noted on mammography with core biopsy demonstrating radial scar requires needle-localized partial lumpectomy to ensure no sampling error.  Specimen: Right breast mass, anterior superior margin  Complications: None  Estimated Blood Loss: 24mL  Findings: 1. Specimen mammography shows wire on specimen, but no clip 2. Pathology call refers gross examination showed former biopsy site 3. No other palpable mass or abnormal tissue in the surrounding area  Description of procedure: Preoperative needle localization was performed by radiology. Localization studies were reviewed. The patient was taken to the operating room and placed supine on the operating table, and after general anesthesia the right breast and axilla were prepped and draped in the usual sterile fashion. A time-out was completed verifying correct patient, procedure, site, positioning, and implant(s) and/or special equipment prior to beginning this procedure.  By comparing the localization studies with the direction and skin entry site of the needle, the probable trajectory and location of the mass was visualized. A skin incision was planned in such a way as to minimize the amount of dissection to reach the mass.  The skin incision was made after infusion of local. Flaps were raised and the location of the wire confirmed. The wire was delivered into the wound. Sharp and blunt dissection was then taken down to an area of palpable abnormality, taking care to include the entire localizing needle and a margin of grossly normal tissue. The specimen and entire localizing wire were removed. The specimen was oriented with paint and sent to  radiology with the localization studies.  Callback received same wires in place but no sign of biopsy clip.  After reviewing the imaging once again, the anterior superior margin where there was additional palpable abnormal tissue was resected, margins inked, and then sent down to radiology.    While waiting for radiology callback, the suction contents were filtered, but no sign of clips were present.  Bedside ultrasound was used to examine the entire area resection and beyond where the clip should be based on mammographic studies, and no clips were found.  C arm was used to attempt to see if any metallic object was able to be visualized within the right breast, but no obvious foreign objects noted.  The entire operative field was extensively examined to see if there is any signs of a metallic clip, but no such clip was found.  Radiology called back and stated there is no clip in second specimen.  Pathology looked at the original specimen and noted several former biopsy sites in the area.  Decision was made at this point to proceed with skin closure.  Wound irrigated, hemostasis was achieved and the wound closed in layers with  interrupted sutures of 3-0 Vicryl in deep dermal layer and a running subcuticular suture of Monocryl 4-0, then dressed with dermabond. The patient tolerated the procedure well and was taken to the postanesthesia care unit in stable condition. Sponge and instrument count correct at end of procedure.

## 2019-10-07 LAB — SURGICAL PATHOLOGY

## 2019-10-08 NOTE — Anesthesia Postprocedure Evaluation (Signed)
Anesthesia Post Note  Patient: KSENIYA SWEEZY  Procedure(s) Performed: PARTIAL MASTECTOMY WITH NEEDLE LOCALIZATION (Right )  Patient location during evaluation: PACU Anesthesia Type: General Level of consciousness: awake and alert and oriented Pain management: pain level controlled Vital Signs Assessment: post-procedure vital signs reviewed and stable Respiratory status: spontaneous breathing Cardiovascular status: blood pressure returned to baseline Anesthetic complications: no     Last Vitals:  Vitals:   10/03/19 1522 10/03/19 1540  BP: (!) 143/83 (!) 148/87  Pulse: 76 79  Resp: 15 16  Temp:  36.7 C  SpO2: 97% 97%    Last Pain:  Vitals:   10/04/19 0857  TempSrc:   PainSc: 4                  Keshawn Fiorito

## 2019-10-31 ENCOUNTER — Institutional Professional Consult (permissible substitution): Payer: PRIVATE HEALTH INSURANCE | Admitting: Neurology

## 2019-10-31 NOTE — Progress Notes (Deleted)
[facility]    Provider:  Dr Jaynee Eagles Referring Provider: Daune Perch DMD Primary Care Physician:  Leonard Downing, MD   CC:  Migraines  Interval history October 31, 2019: This is a patient of mine who I see for migraines but is here as a new request from Dr. Quincy Simmonds for facial pain.  She has a past medical history of chronic migraines, depression, anxiety, bruxism.   Interval history April 17, 2019: This is a patient with refractory migraines, tried multiple medications including Botox.  These medications did not work and she was having at least 16 migraine days a month.  We started her atrophy and she is doing extremely well.  Her migraines have significantly improved.  We did discuss her insurance issues, apparently we did get the PA approved until February 2021 but her co-pay for 3 shots is $1000.  At this point she has been using the co-pay card about 6 months the co-pay card should last her about a year, I did go and speak with my nurse regarding this and my nurse came in and also discussed with patient.  She is moving pharmacies we recommend she bring her co-pay card or ask 1 pharmacy to transfer all the information over for her.  She is fine with acute management.  She does not feel at this time that she needs anything else she wants to continue with atrophy.  We discussed acute management and other options but atrophy seems to be working.  She is a current every day smoker I did discuss smoking cessation.  Interval History 11/22/2017: Say her last a year ago, tried botox but she never returned for further treatments. Her migraines are "horrible". She tried botox once but didn;t like the way it made her feel. She has 20 migraine days a month. She takes a triptan and frequently runs out. No medication overuse. She takes topiramate twice a day. It was recently increased. Her mouth is very dry. No medication overuse. Migraines can last all day, pulsating, unilateral,  photo/phonophobia. Nausea. She is quite distressed.no aura.  Meds tried: Rizatriptan, Depakote, propranolol, tramadol, cymbalta, topamax, amitriptyline, imitrex and multiple others over the last 20+ years, Botox x once (didn't like the way it made her feel).   Addendum 03/2017: Her fiance has noticed that she snores, grinds her teeth and sometimes grasps for air at night. She reports waking up every morning with a headache. [name] that a previous doctor had recommended she have a sleep study but she never had it done. Requested sleep eval.  HPI:  Kelly Dorsey is a 44 y.o. female here as a referral from Dr. Arelia Sneddon for migraines PMHx depression, anxiety, current smoker, migraines. She has had migraines since the age of 3. They can be in the forehead and in the eyes, the main headaches are on the right side, pressure behind the eye, sensitivity to light, sound, smells, has to go into a dark room and sleep. Dark rooms make it better. She has nausea and vomits. Migraines 3-4x a week on average 16 or more a month. No aura. They can last up to 3 days on average 12-16 hours. She has nausea and vomiting. Maxalt helps. Worse with season changes, pressure outside makes it worse. She has kept multiple migraine diaries and no foods were identified. Stress and anxiety makes it worse. Can be severe but on average 10/10 in pain. It comes on quickly, not a slow build up. She takes her maxalt and she lays  down it helps but doesn't stop it. She is smoking. She is not interested in taking anything else. She has a FHx of migraines in cousins and grandmother and son.   Meds tried: Rizatriptan, Depakote, propranolol, tramadol, cymbalta, topamax, amitriptyline, imitrex and multiple others over the last 20+ years  Reviewed notes, labs and imaging from outside physicians, which showed:  Reviewed notes from [facility]. 3 migraines daily. She has left work multiple times a week. Pressure in her head  daily. On cyclobenzaprine, mixed amphetamines and clonazepam. It appears they tried Depakote which made her depressed, horrible dreams like nightmares but it helped her headaches and at some point possibly they lowered the dose to 125 daily delayed release. They also tried rizatriptan. Clonazepam. Handwritten notes that I can't read. They tried propranolol.  CBC in October 26 was normal, CMP in October 2016 was unremarkable with normal creatinine 0.88 and normal BUN 8   ROS: dizziness, headache, numbness, agitation otherwise normal  Social History   Socioeconomic History  . Marital status: Widowed    Spouse name: Not on file  . Number of children: 3  . Years of education: 10  . Highest education level: Not on file  Occupational History  . Occupation: None  Tobacco Use  . Smoking status: Current Every Day Smoker    Packs/day: 0.25    Years: 22.00    Pack years: 5.50    Types: Cigarettes  . Smokeless tobacco: Never Used  Substance and Sexual Activity  . Alcohol use: No  . Drug use: No  . Sexual activity: Not on file  Other Topics Concern  . Not on file  Social History Narrative   Drinks about 2 cups of caffeine drinks a day    Right handed   Lives at home with her children & fiance'   Social Determinants of Health   Financial Resource Strain:   . Difficulty of Paying Living Expenses: Not on file  Food Insecurity:   . Worried About Charity fundraiser in the Last Year: Not on file  . Ran Out of Food in the Last Year: Not on file  Transportation Needs:   . Lack of Transportation (Medical): Not on file  . Lack of Transportation (Non-Medical): Not on file  Physical Activity:   . Days of Exercise per Week: Not on file  . Minutes of Exercise per Session: Not on file  Stress:   . Feeling of Stress : Not on file  Social Connections:   . Frequency of Communication with Friends and Family: Not on file  . Frequency of Social Gatherings with Friends and Family: Not on file  .  Attends Religious Services: Not on file  . Active Member of Clubs or Organizations: Not on file  . Attends Archivist Meetings: Not on file  . Marital Status: Not on file  Intimate Partner Violence:   . Fear of Current or Ex-Partner: Not on file  . Emotionally Abused: Not on file  . Physically Abused: Not on file  . Sexually Abused: Not on file    Family History  Problem Relation Age of Onset  . Hypertension Mother   . Hypertension Father   . Diabetes Father   . Migraines Father   . Migraines Brother   . Migraines Paternal Grandmother   . Breast cancer Paternal Grandmother 25       post meno  . Migraines Paternal Grandfather   . Breast cancer Maternal Aunt 52  .  Breast cancer Paternal Aunt 22  . Breast cancer Cousin 65    Past Medical History:  Diagnosis Date  . Anxiety   . Complication of anesthesia    AFTER BTL IN 2006, BP BECAME VERY ELEVATED-WITH EPIDURAL DURING DELIVERY, PT STARTED VOMITING AN SHAKING AND EPIDURAL HAD TO BE STOPPED-PT STATES WITH WISDOM TEETH SHE STOPPED BREATHING AND WAS SENT FOR SLEEP STUDY WHICH WAS NEGATIVE  . Depression   . GERD (gastroesophageal reflux disease)    OCC- NO MEDS  . Migraine    MIGRAINES  . Mood swings   . Thyroid nodule     Past Surgical History:  Procedure Laterality Date  . APPENDECTOMY    . BREAST BIOPSY Left 09/18/2017   [place] core 10:00 FIBROEPITHELIAL LESION  . BREAST BIOPSY Right 09/04/2019   affirm bx of distortion,  x marker, radial scar  . BREAST EXCISIONAL BIOPSY Left 10/06/2017   lumpectomy with NP- fibroadeoma  . BREAST LUMPECTOMY Right 10/03/2019   radial scar  . BREAST LUMPECTOMY WITH NEEDLE LOCALIZATION Left 10/06/2017   Procedure: BREAST LUMPECTOMY WITH NEEDLE LOCALIZATION;  Surgeon: Leonie Green, MD;  Location: [facility];  Service: General;  Laterality: Left;  . COLONOSCOPY    . DILITATION & CURRETTAGE/HYSTROSCOPY WITH NOVASURE ABLATION N/A 07/17/2015   Procedure: DILATATION &  CURETTAGE/HYSTEROSCOPY WITH NOVASURE ABLATION;  Surgeon: Aletha Halim, MD;  Location: [facility];  Service: Gynecology;  Laterality: N/A;  . EYE SURGERY     lasik  . LAPAROSCOPY ABDOMEN DIAGNOSTIC    . LASIK    . PARTIAL MASTECTOMY WITH NEEDLE LOCALIZATION Right 10/03/2019   Procedure: PARTIAL MASTECTOMY WITH NEEDLE LOCALIZATION;  Surgeon: Benjamine Sprague, DO;  Location: [facility];  Service: General;  Laterality: Right;  . TUBAL LIGATION    . WISDOM TOOTH EXTRACTION      Current Outpatient Medications  Medication Sig Dispense Refill  . acetaminophen (TYLENOL) 325 MG tablet Take 2 tablets (650 mg total) by mouth every 8 (eight) hours as needed for mild pain. 40 tablet 0  . amphetamine-dextroamphetamine (ADDERALL XR) 20 MG 24 hr capsule Take 20 mg by mouth daily.    . clonazePAM (KLONOPIN) 0.5 MG tablet Take 0.5-1 mg by mouth at bedtime.     . diclofenac Sodium (VOLTAREN) 1 % GEL Apply 2 g topically 2 (two) times daily as needed (arthritis pain in hands).    . famotidine (PEPCID) 20 MG tablet Take 20 mg by mouth daily.    . Fremanezumab-vfrm ([name]) 225 MG/1.5ML SOSY Inject 225 mg into the skin every 30 (thirty) days. 1.5 mL 12  . ibuprofen (ADVIL) 800 MG tablet Take 1 tablet (800 mg total) by mouth every 8 (eight) hours as needed for mild pain or moderate pain. 30 tablet 0  . oxyCODONE-acetaminophen (PERCOCET) 5-325 MG tablet Take 1 tablet by mouth every 4 (four) hours as needed for up to 6 doses for severe pain. 6 tablet 0  . rizatriptan (MAXALT) 10 MG tablet Take 10 mg by mouth as needed for migraine. For migraines. May repeat in 2 hours if needed    . topiramate (TOPAMAX) 50 MG tablet Take 100 mg by mouth 2 (two) times daily.     No current facility-administered medications for this visit.    Allergies as of 10/31/2019 - Review Complete 10/03/2019  Allergen Reaction Noted  . Other  07/09/2015    Vitals: There were no vitals taken for this visit. Last Weight:  Wt Readings from Last 1  Encounters:  09/30/19 157  lb 9.6 oz (71.5 kg)   Last Height:   Ht Readings from Last 1 Encounters:  09/30/19 5\' 6"  (1.676 m)   Physical exam: Exam: Gen: Distressed over migraines                CV: RRR, no MRG. No Carotid Bruits. No peripheral edema, warm, nontender Eyes: Conjunctivae clear without exudates or hemorrhage  Neuro: Detailed Neurologic Exam  Speech:    Speech is normal; fluent and spontaneous with normal comprehension.  Cognition:    The patient is oriented to person, place, and time;     recent and remote memory intact;     language fluent;     normal attention, concentration,     fund of knowledge Cranial Nerves:    The pupils are equal, round, and reactive to light. The fundi are normal and spontaneous venous pulsations are present. Visual fields are full to finger confrontation. Extraocular movements are intact. Trigeminal sensation is intact and the muscles of mastication are normal. The face is symmetric. The palate elevates in the midline. Hearing intact. Voice is normal. Shoulder shrug is normal. The tongue has normal motion without fasciculations.   Coordination:    Normal finger to nose and heel to shin. Normal rapid alternating movements.   Gait:    Heel-toe and tandem gait are normal.   Motor Observation:    No asymmetry, no atrophy, and no involuntary movements noted. Tone:    Normal muscle tone.    Posture:    Posture is normal. normal erect    Strength:    Strength is V/V in the upper and lower limbs.      Sensation: intact to LT     Reflex Exam:  DTR's:    Deep tendon reflexes in the upper and lower extremities are normal bilaterally.   Toes:    The toes are downgoing bilaterally.   Clonus:    Clonus is absent.     Assessment/Plan:  44 year old with chronic migraines without aura with status migrainosus intractabke.  Doing fantastic on atrophy, continue..  - She has tried and failed most first line and second line agents over  the last 20+ years.  - Continue [name], new cgrp medications - MRI of the brain was normal - sleep evaluation was normal. - Smoking: encouraged cessation, can worsen migraines  Cc: Dr. Molli Knock, MD  [facility] [address] [place], [place] [address]  Phone [phone] Fax [phone]  A total of 25 minutes was spent face-to-face video with this patient. Over half this time was spent on counseling patient on the intractable migraine diagnosis and different diagnostic and therapeutic options available.

## 2019-11-04 ENCOUNTER — Encounter: Payer: Self-pay | Admitting: Neurology

## 2019-11-28 ENCOUNTER — Encounter: Payer: Self-pay | Admitting: *Deleted

## 2019-11-28 NOTE — Telephone Encounter (Signed)
Completed PA on CMM. KeyDD:[id number]. [employer] approved it immediately.   [id number];Review Type:Prior Auth;Coverage Start Date:10/29/2019;Coverage End Date:11/27/2020;

## 2020-04-06 ENCOUNTER — Ambulatory Visit: Payer: PRIVATE HEALTH INSURANCE | Admitting: Podiatry

## 2020-07-14 ENCOUNTER — Encounter: Payer: Self-pay | Admitting: *Deleted

## 2020-07-23 ENCOUNTER — Telehealth: Payer: Self-pay | Admitting: Neurology

## 2020-07-23 MED ORDER — AJOVY 225 MG/1.5ML ~~LOC~~ SOSY: 225.0000 mg | PREFILLED_SYRINGE | SUBCUTANEOUS | 0 refills | Status: DC

## 2020-07-23 NOTE — Telephone Encounter (Signed)
Done. Pt has a pending appt.

## 2020-07-23 NOTE — Telephone Encounter (Signed)
Pt requested refill Fremanezumab-vfrm ([name]) 225 MG/1.5ML SOSY at [facility].

## 2020-08-09 ENCOUNTER — Other Ambulatory Visit: Payer: Self-pay | Admitting: Neurology

## 2020-08-11 NOTE — Telephone Encounter (Signed)
[name] PA completed on Cover My Meds. Key: [id number]. Awaiting determination from [employer].

## 2020-08-17 NOTE — Telephone Encounter (Addendum)
Received fax from [facility]. [name] 225 mg has been approved from 09/08/20-09/08/21. Pt's pharmacy notified via fax. Received a receipt of confirmation.

## 2020-10-05 ENCOUNTER — Ambulatory Visit: Payer: PRIVATE HEALTH INSURANCE | Admitting: Neurology

## 2020-10-05 ENCOUNTER — Telehealth: Payer: Self-pay | Admitting: Neurology

## 2020-10-05 ENCOUNTER — Encounter: Payer: Self-pay | Admitting: Neurology

## 2020-10-05 NOTE — Telephone Encounter (Signed)
FYI: Pt called to cancel same day appt. Informed pt would be a no show fee of $50, can transfer you to Billing, then we can reschedule your appt.  Pt confirmed understood, transferred Pt to Billing.

## 2021-06-19 IMAGING — MG MM BREAST LOCALIZATION CLIP
4 series · 4 of 12 positions shown · non-contrast
Comparison: Previous exam(s).

CLINICAL DATA: Patient presents for biopsy of right breast
distortion.

EXAM:
DIAGNOSTIC RIGHT MAMMOGRAM POST STEREOTACTIC BIOPSY

[R CC synth-2D]
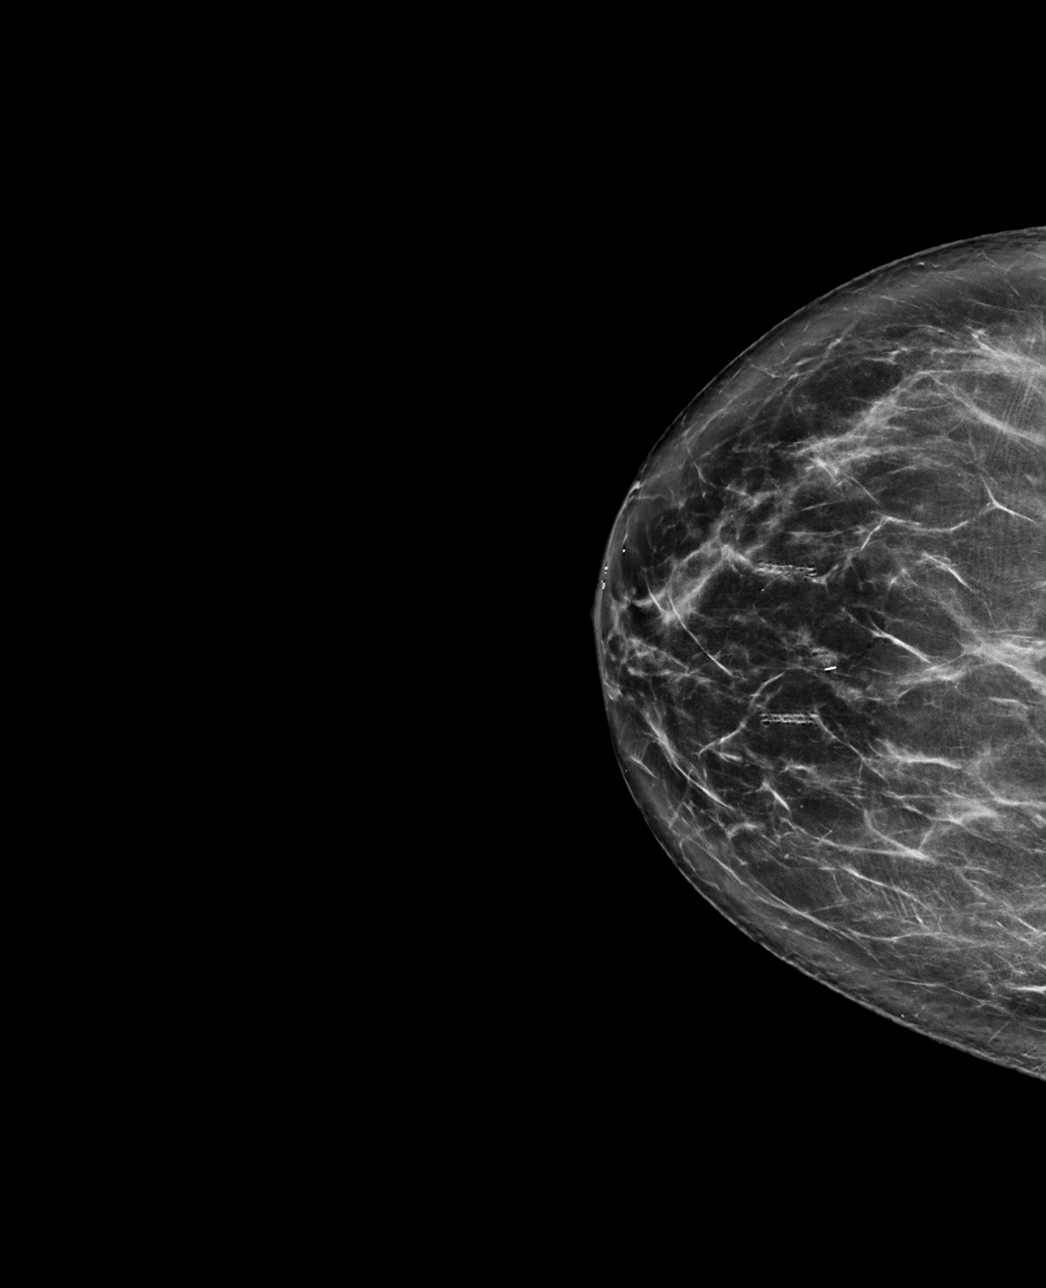

[R ML synth-2D]
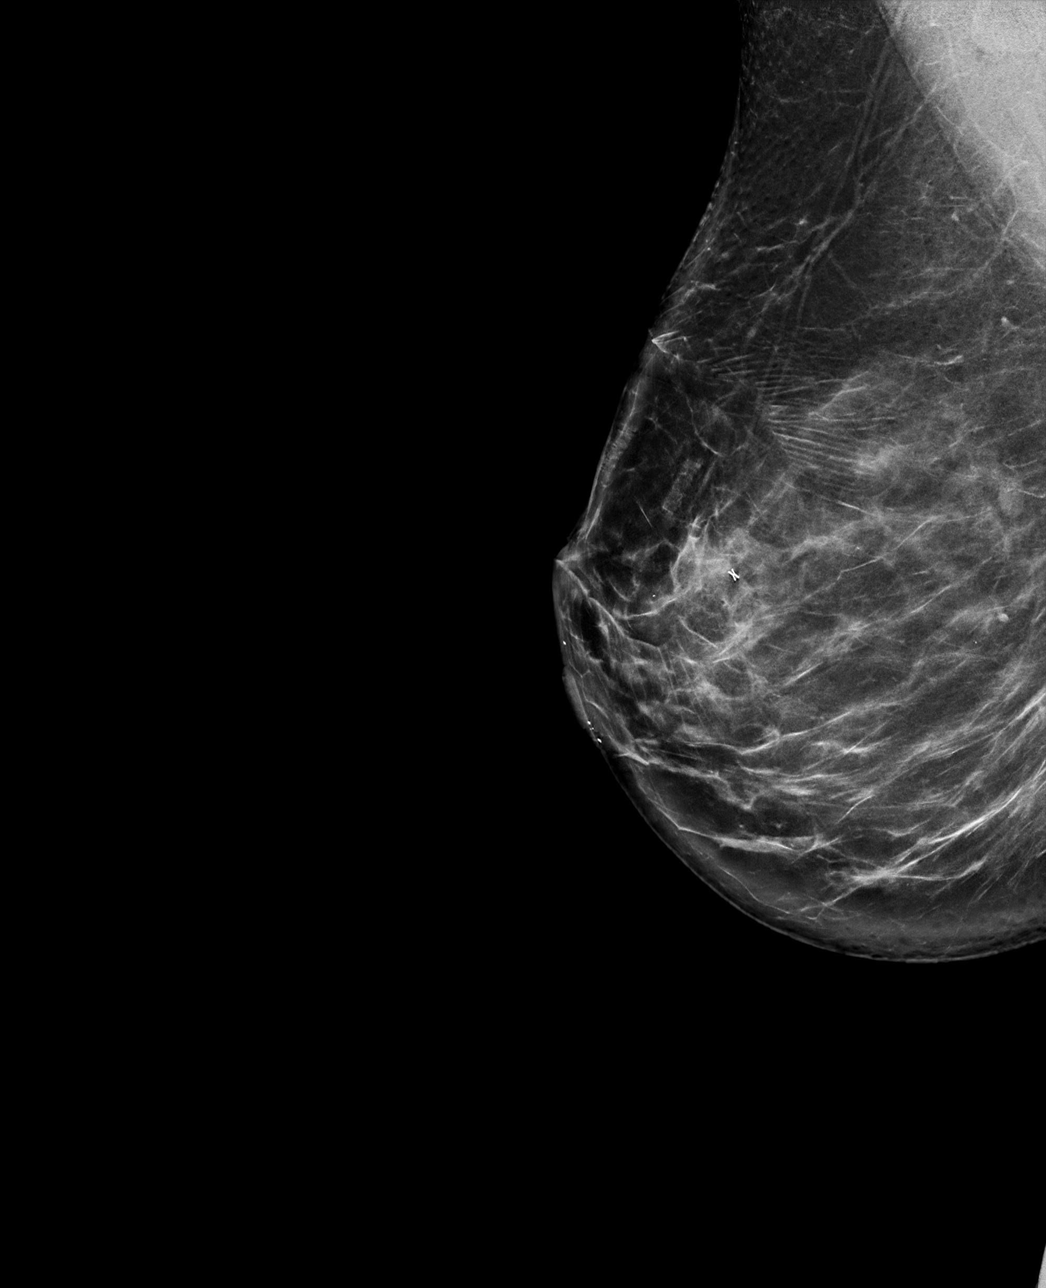

[R ML tomo · tomo slice 46/91.0]
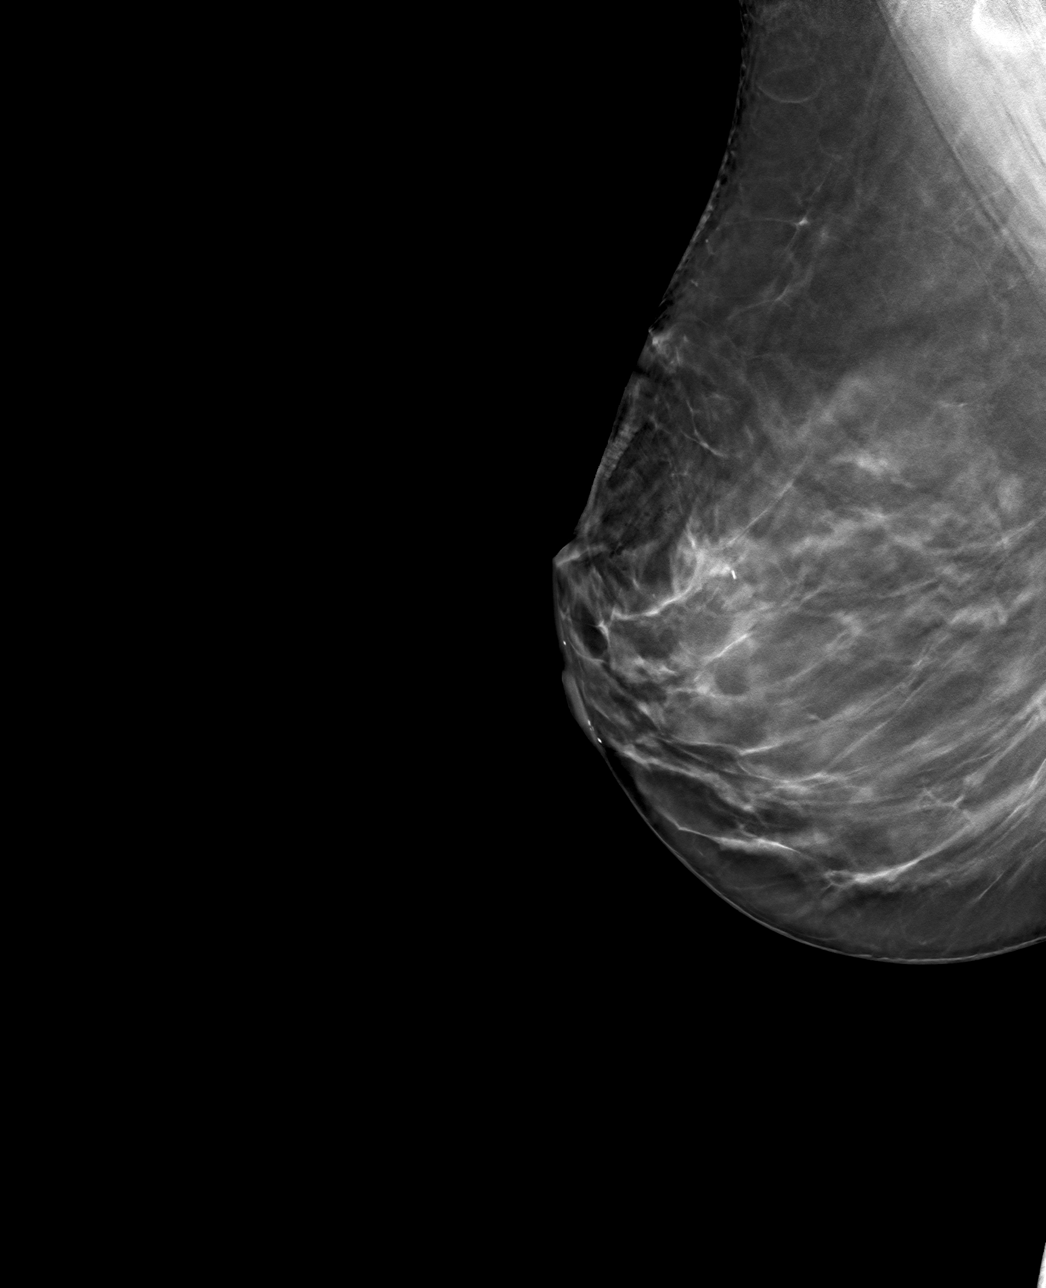

[R CC tomo · tomo slice 41/82.0]
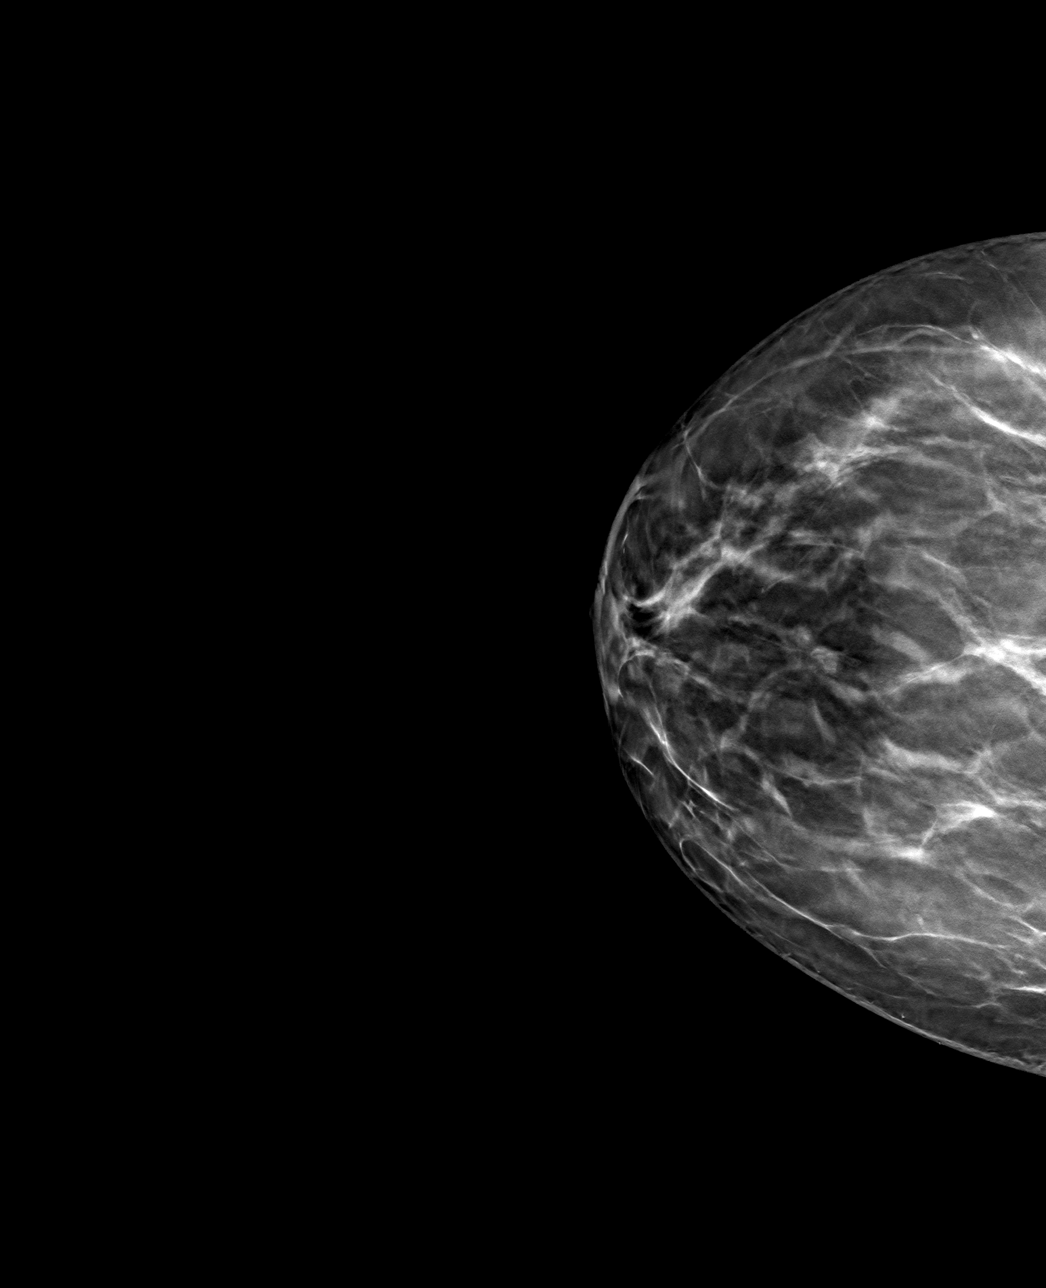

[4 of 12 positions shown; findings below may reference images not displayed]

FINDINGS: Mammographic images were obtained following stereotactic guided
biopsy of architectural distortion in the upper inner right breast.
The biopsy marking clip is in expected position at the site of
biopsy.
IMPRESSION: Appropriate positioning of the X shaped biopsy marking clip at the
site of biopsy in the upper inner right breast.

Final Assessment: Post Procedure Mammograms for Marker Placement

## 2021-12-29 ENCOUNTER — Encounter (INDEPENDENT_AMBULATORY_CARE_PROVIDER_SITE_OTHER): Payer: Self-pay

## 2022-01-13 ENCOUNTER — Ambulatory Visit
Admission: RE | Admit: 2022-01-13 | Discharge: 2022-01-13 | Disposition: A | Discharge status: Home / Self Care | Payer: No Typology Code available for payment source | Source: Ambulatory Visit | Attending: Family Medicine | Admitting: Family Medicine

## 2022-01-13 ENCOUNTER — Other Ambulatory Visit: Payer: Self-pay | Admitting: Family Medicine

## 2022-01-13 DIAGNOSIS — R6883 Chills (without fever): Secondary | ICD-10-CM

## 2022-01-13 DIAGNOSIS — L299 Pruritus, unspecified: Secondary | ICD-10-CM

## 2022-01-14 LAB — LAB REPORT - SCANNED: EGFR: 101

## 2022-04-29 LAB — LAB REPORT - SCANNED: EGFR: 95

## 2022-06-08 ENCOUNTER — Encounter: Payer: Self-pay | Admitting: Emergency Medicine

## 2022-06-08 ENCOUNTER — Emergency Department: Payer: Medicaid Other

## 2022-06-08 ENCOUNTER — Emergency Department
Admission: EM | Admit: 2022-06-08 | Discharge: 2022-06-08 | Disposition: A | Discharge status: Home / Self Care | Payer: Medicaid Other | Attending: Emergency Medicine | Admitting: Emergency Medicine

## 2022-06-08 ENCOUNTER — Other Ambulatory Visit: Payer: Self-pay

## 2022-06-08 DIAGNOSIS — F0631 Mood disorder due to known physiological condition with depressive features: Secondary | ICD-10-CM

## 2022-06-08 DIAGNOSIS — F32A Depression, unspecified: Secondary | ICD-10-CM | POA: Insufficient documentation

## 2022-06-08 DIAGNOSIS — F17210 Nicotine dependence, cigarettes, uncomplicated: Secondary | ICD-10-CM | POA: Insufficient documentation

## 2022-06-08 DIAGNOSIS — M5135 Other intervertebral disc degeneration, thoracolumbar region: Secondary | ICD-10-CM | POA: Insufficient documentation

## 2022-06-08 DIAGNOSIS — Z20822 Contact with and (suspected) exposure to covid-19: Secondary | ICD-10-CM | POA: Insufficient documentation

## 2022-06-08 DIAGNOSIS — G8929 Other chronic pain: Secondary | ICD-10-CM | POA: Insufficient documentation

## 2022-06-08 LAB — COMPREHENSIVE METABOLIC PANEL
ALT: 14 U/L (ref 0–44)
AST: 15 U/L (ref 15–41)
Albumin: 4.6 g/dL (ref 3.5–5.0)
Alkaline Phosphatase: 61 U/L (ref 38–126)
Anion gap: 6 (ref 5–15)
BUN: 7 mg/dL (ref 6–20)
CO2: 26 mmol/L (ref 22–32)
Calcium: 9.1 mg/dL (ref 8.9–10.3)
Chloride: 106 mmol/L (ref 98–111)
Creatinine, Ser: 0.57 mg/dL (ref 0.44–1.00)
GFR, Estimated: >60 mL/min (ref >60)
Glucose, Bld: 93 mg/dL (ref 70–99)
Potassium: 4 mmol/L (ref 3.5–5.1)
Sodium: 138 mmol/L (ref 135–145)
Total Bilirubin: 0.5 mg/dL (ref 0.3–1.2)
Total Protein: 7.7 g/dL (ref 6.5–8.1)

## 2022-06-08 LAB — URINALYSIS, ROUTINE W REFLEX MICROSCOPIC
Bacteria, UA: NONE SEEN
Bilirubin Urine: NEGATIVE
Glucose, UA: NEGATIVE mg/dL
Ketones, ur: NEGATIVE mg/dL
Leukocytes,Ua: NEGATIVE
Nitrite: NEGATIVE
Protein, ur: NEGATIVE mg/dL
Specific Gravity, Urine: 1.018 (ref 1.005–1.030)
pH: 6 (ref 5.0–8.0)

## 2022-06-08 LAB — CBC WITH DIFFERENTIAL/PLATELET
Abs Immature Granulocytes: 0.01 10*3/uL (ref 0.00–0.07)
Basophils Absolute: 0.1 10*3/uL (ref 0.0–0.1)
Basophils Relative: 1 %
Eosinophils Absolute: 0.1 10*3/uL (ref 0.0–0.5)
Eosinophils Relative: 1 %
HCT: 44.7 % (ref 36.0–46.0)
Hemoglobin: 14.9 g/dL (ref 12.0–15.0)
Immature Granulocytes: 0 %
Lymphocytes Relative: 33 %
Lymphs Abs: 2.9 10*3/uL (ref 0.7–4.0)
MCH: 30.4 pg (ref 26.0–34.0)
MCHC: 33.3 g/dL (ref 30.0–36.0)
MCV: 91.2 fL (ref 80.0–100.0)
Monocytes Absolute: 0.7 10*3/uL (ref 0.1–1.0)
Monocytes Relative: 8 %
Neutro Abs: 5.1 10*3/uL (ref 1.7–7.7)
Neutrophils Relative %: 57 %
Platelets: 321 10*3/uL (ref 150–400)
RBC: 4.9 MIL/uL (ref 3.87–5.11)
RDW: 12.2 % (ref 11.5–15.5)
WBC: 8.8 10*3/uL (ref 4.0–10.5)
nRBC: 0 % (ref 0.0–0.2)

## 2022-06-08 LAB — LIPASE, BLOOD: Lipase: 44 U/L (ref 11–51)

## 2022-06-08 LAB — TROPONIN I (HIGH SENSITIVITY)
Troponin I (High Sensitivity): 2 ng/L (ref <18)
Troponin I (High Sensitivity): 2 ng/L (ref <18)

## 2022-06-08 LAB — POC URINE PREG, ED: Preg Test, Ur: NEGATIVE

## 2022-06-08 LAB — RESP PANEL BY RT-PCR (FLU A&B, COVID) ARPGX2
Influenza A by PCR: NEGATIVE
Influenza B by PCR: NEGATIVE
SARS Coronavirus 2 by RT PCR: NEGATIVE

## 2022-06-08 MED ORDER — IBUPROFEN 600 MG PO TABS: 600.0000 mg | ORAL_TABLET | Freq: Four times a day (QID) | ORAL | 0 refills | Status: AC | PRN

## 2022-06-08 MED ORDER — PREDNISONE 10 MG (21) PO TBPK: ORAL_TABLET | ORAL | 0 refills | Status: DC

## 2022-06-08 MED ORDER — LIDOCAINE 5 % EX PTCH
1.0000 | MEDICATED_PATCH | Freq: Once | CUTANEOUS | Status: DC
  Administered 2022-06-08: 1 via TRANSDERMAL
  Filled 2022-06-08: qty 1

## 2022-06-08 MED ORDER — ACETAMINOPHEN 500 MG PO TABS
1000.0000 mg | ORAL_TABLET | Freq: Once | ORAL | Status: AC
  Administered 2022-06-08: 1000 mg via ORAL
  Filled 2022-06-08: qty 2

## 2022-06-08 MED ORDER — KETOROLAC TROMETHAMINE 15 MG/ML IJ SOLN
15.0000 mg | Freq: Once | INTRAMUSCULAR | Status: DC
  Filled 2022-06-08: qty 1

## 2022-06-08 MED ORDER — LIDOCAINE 5 % EX PTCH: 1.0000 | MEDICATED_PATCH | Freq: Two times a day (BID) | CUTANEOUS | 0 refills | Status: AC

## 2022-06-08 MED ORDER — KETOROLAC TROMETHAMINE 15 MG/ML IJ SOLN
15.0000 mg | Freq: Once | INTRAMUSCULAR | Status: AC
  Administered 2022-06-08: 15 mg via INTRAVENOUS
  Filled 2022-06-08: qty 1

## 2022-06-08 MED ORDER — IOHEXOL 350 MG/ML SOLN
100.0000 mL | Freq: Once | INTRAVENOUS | Status: AC | PRN
  Administered 2022-06-08: 100 mL via INTRAVENOUS

## 2022-06-08 NOTE — Discharge Instructions (Addendum)
Take the Tylenol 1 g every 8 hours and alternate with the ibuprofen.  Use the steroids and lidocaine patches.  Call your primary care doctor to make a follow-up appointment.  You will need to have a doctor who can help coordinate your care between the different doctors.  Return to the ER if you develop new change in symptoms.   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 Negative for thoracic Fracture  For mental health services: [facility] Sherrian Divers is our representative. You can call him directly at [phone]

## 2022-06-08 NOTE — ED Notes (Signed)
Dr. Jari Pigg states not to move pt. To [facility], she will call [facility] and have pt. Assessed in ED room. Dr. Jari Pigg states don't dress pt. Out or take belongings.

## 2022-06-08 NOTE — ED Notes (Signed)
Report called to Abigail Butts, [place] PCT will dress pt. Out and transport her to [facility].

## 2022-06-08 NOTE — ED Provider Notes (Addendum)
[facility] Provider Note    Event Date/Time   First MD Initiated Contact with Patient 06/08/22 1103     (approximate)   History   Neck Pain   HPI  Kelly Dorsey is a 46 y.o. female who is status post partial mastectomy for breast lump 2 years ago with no follow up who comes in with left arm neck and shoulder blade pain.  Patient reports chronic pain on her left side but reports sudden onset of worsening this morning with pain radiating from her chest into her back and all the way down the left side of her back.  She does have a little bit of tingling associated with it but this tingling has been constant for years and is not any worse.  The tingling is only in the second digit of the finger up the arm and in the toes of the left leg up to the knee.  She reports that it is a constant tingling.  She does report that it is more of the pain that is worsening than previous.  She denies any new falls or hitting her head.  She denies any new weakness.  No new fevers.     Physical Exam   Triage Vital Signs: ED Triage Vitals  Enc Vitals Group     BP 06/08/22 1029 (!) 136/98     Pulse Rate 06/08/22 1029 95     Resp 06/08/22 1029 18     Temp 06/08/22 1029 98.4 F (36.9 C)     Temp Source 06/08/22 1029 Oral     SpO2 06/08/22 1029 98 %     Weight 06/08/22 1030 131 lb (59.4 kg)     Height 06/08/22 1030 '5\' 6"'$  (1.676 m)     Head Circumference --      Peak Flow --      Pain Score 06/08/22 1030 10     Pain Loc --      Pain Edu? --      Excl. in [place]? --     Most recent vital signs: Vitals:   06/08/22 1029  BP: (!) 136/98  Pulse: 95  Resp: 18  Temp: 98.4 F (36.9 C)  SpO2: 98%     General: Awake, no distress.  CV:  Good peripheral perfusion.  Resp:  Normal effort.  Abd:  No distention.  Other:  Patient has left shoulder blade pain left flank pain pain on the left paraspinal area but no significant CTL spine pain.  She is got good strength in her  bilateral arms and bilateral legs.  She reports some sensation changes as noted in the HPI.   ED Results / Procedures / Treatments   Labs (all labs ordered are listed, but only abnormal results are displayed) Labs Reviewed  CBC WITH DIFFERENTIAL/PLATELET  COMPREHENSIVE METABOLIC PANEL  LIPASE, BLOOD  URINALYSIS, ROUTINE W REFLEX MICROSCOPIC  POC URINE PREG, ED  TROPONIN I (HIGH SENSITIVITY)     EKG  My interpretation of EKG:  Normal sinus rate of 72 without any ST elevation or T wave inversions  RADIOLOGY I have reviewed the CT head personally interpreted no evidence of intercranial hemorrhage  PROCEDURES:  Critical Care performed: No  Procedures   MEDICATIONS ORDERED IN ED: Medications - No data to display   IMPRESSION / MDM / [place] / ED COURSE  I reviewed the triage vital signs and the nursing notes.   Patient's presentation is most consistent with acute presentation  with potential threat to life or bodily function.   Patient comes in with worsening left-sided pain associate with some tingling.  Some of this tingling has been more chronic in nature but given the worsening pain running from her back down into her abdomen consider dissection, ACS.  Does have a history of of breast cancers to consider cancer.  We will get CT imaging, labs, EKG, troponin to further evaluate give patient Tylenol and lidocaine patch given she is driving home.    Troponin negative x2 pregnancy test negative CBC reassuring CMP reassuring lipase normal UA without evidence of UTI.  CT imaging all reassuring some mild degenerative changes.  I reevaluated patient and her tingling is now resolved she continues to have equal strength in her arms and legs and its more just pain that is on the left shoulder blade going down into her left arm.  I do not see any swelling and patient has good distal pulses throughout.  I did do a rectal exam with normal rectal tone no saddle anesthesia  there is no bladder retention noted on CT imaging.  She is got reflexes in her bilateral knees and normal flexion extension of her feet.  We discussed that at this time there is no indication for MRI given no evidence of cord compression.  We discussed that this may not even be related to the spine given a lot of her pain is more in her shoulder and her tingling in the fingers is not really correlated with a certain nerve root.  We discussed that this could be rheumatological or fibromyalgia.  Patient reports that she is already seen a rheumatologist, neurologist, orthopedic doctor for this previously and that she is just getting frustrated that she does not have answers today.  Explained to patient that given this is been going on long-term that I do not have any answers but it takes many doctor to kind of help figure her this out.  Patient starts to cry and then state that may be that is why her doctor wants her to see a psychiatrist.  She does report having SI secondary to the chronic pain and but does not have a plan.  Patient is wanting to talk to a psychiatrist today.  At this time patient is medically cleared pending psychiatric evaluation.  We will trial some steroids, ibuprofen, lidocaine patches.  Considered admission but given her cardiac markers are negative and this seems like it is more like an acute exacerbation of her chronic pain given similar character patient can follow-up outpatient with her primary care doctor for further work-up and testing  The patient has been placed in psychiatric observation due to the need to provide a safe environment for the patient while obtaining psychiatric consultation and evaluation, as well as ongoing medical and medication management to treat the patient's condition.  The patient has not been placed under full IVC at this time.   Patient was cleared by psychiatry for discharge, provided resource is for outpatient help   FINAL CLINICAL IMPRESSION(S) / ED  DIAGNOSES   Final diagnoses:  DDD (degenerative disc disease), thoracolumbar  Other chronic pain     Rx / DC Orders   ED Discharge Orders          Ordered    predniSONE (STERAPRED UNI-PAK 21 TAB) 10 MG (21) TBPK tablet        06/08/22 1546    lidocaine (LIDODERM) 5 %  Every 12 hours  06/08/22 1546    ibuprofen (ADVIL) 600 MG tablet  Every 6 hours PRN        06/08/22 1546             Note:  This document was prepared using Dragon voice recognition software and may include unintentional dictation errors.   Vanessa [place], MD 06/08/22 1546    Vanessa [place], MD 06/08/22 1547    Vanessa [place], MD 06/08/22 1710

## 2022-06-08 NOTE — ED Triage Notes (Signed)
Patient arrives ambulatory by POV with mother c/o left arm, neck and shoulder blade pain worsening this morning. Patient states she was told she has a slipped disc and has had ongoing issues for the past 2-3 years.
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
## 2022-06-08 NOTE — ED Notes (Signed)
Pt states that she has been hurting in her neck for a long time, but states that she woke up this am with worsening left sided neck pain and states that she has been having bilat hand and arm tingling for a long time.

## 2022-07-10 ENCOUNTER — Encounter (INDEPENDENT_AMBULATORY_CARE_PROVIDER_SITE_OTHER): Payer: Self-pay

## 2022-07-14 ENCOUNTER — Encounter: Payer: Self-pay | Admitting: Neurology

## 2022-07-14 ENCOUNTER — Ambulatory Visit (INDEPENDENT_AMBULATORY_CARE_PROVIDER_SITE_OTHER): Payer: Self-pay | Admitting: Neurology

## 2022-07-14 VITALS — BP 147/90 | HR 109 | Ht 66.0 in | Wt 125.0 lb

## 2022-07-14 DIAGNOSIS — R2990 Unspecified symptoms and signs involving the nervous system: Secondary | ICD-10-CM

## 2022-07-14 DIAGNOSIS — G8929 Other chronic pain: Secondary | ICD-10-CM

## 2022-07-14 DIAGNOSIS — R6889 Other general symptoms and signs: Secondary | ICD-10-CM

## 2022-07-14 NOTE — Patient Instructions (Signed)
No charge today Wait for [facility] financial assistance Schedule a phone visit BUT if [facility] financial assistance will schedule video then in office

## 2022-07-14 NOTE — Progress Notes (Signed)
[facility]    Provider:  Dr Jaynee Eagles Requesting Provider: Susa Day, MD Primary Care Provider:  Leonard Downing, MD  CC:  multiple somatic and neurologic complaints  HPI:  Kelly Dorsey is a 46 y.o. female here as requested by Susa Day, MD for multiple issues. She is constantly in pain, she did not show up in time today, showed up past her appointment time and she has a plethora of things to discuss and she is self pay. I will not charge her today, I examined her, she is awaiting cone financial assistance, will refund her money. If she gets cone financial assistance we can have a video call (she lives in [place] 30 minutes difficult to get here). Will wait and see if she gets cone assistance which I suspect she will. She is also in debt to come for thousands of dollars and self pay, will see if cone assistance comes through if not will try to do over the phone since I have seen and examined her and no charge her today   Social History   Socioeconomic History   Marital status: Widowed    Spouse name: Not on file   Number of children: 3   Years of education: 10   Highest education level: Not on file  Occupational History   Occupation: None  Tobacco Use   Smoking status: Every Day    Packs/day: 0.25    Years: 22.00    Total pack years: 5.50    Types: Cigarettes   Smokeless tobacco: Never  Vaping Use   Vaping Use: Never used  Substance and Sexual Activity   Alcohol use: No   Drug use: No   Sexual activity: Not on file  Other Topics Concern   Not on file  Social History Narrative   Drinks about 2 cups of caffeine drinks a day    Right handed   Lives at home with her children & fiance'   Social Determinants of Health   Financial Resource Strain: Not on file  Food Insecurity: Not on file  Transportation Needs: Not on file  Physical Activity: Not on file  Stress: Not on file  Social Connections: Not on file  Intimate Partner Violence:  Not on file    Family History  Problem Relation Age of Onset   Hypertension Mother    Hypertension Father    Diabetes Father    Migraines Father    Migraines Brother    Breast cancer Maternal Aunt 52   Breast cancer Paternal Aunt 57   Migraines Paternal Grandmother    Breast cancer Paternal Grandmother 70       post meno   Migraines Paternal Grandfather    Breast cancer Cousin 83   Neuropathy Neg Hx     Past Medical History:  Diagnosis Date   Anxiety    Complication of anesthesia    AFTER BTL IN 2006, BP BECAME VERY ELEVATED-WITH EPIDURAL DURING DELIVERY, PT STARTED VOMITING AN SHAKING AND EPIDURAL HAD TO BE STOPPED-PT STATES WITH WISDOM TEETH SHE STOPPED BREATHING AND WAS SENT FOR SLEEP STUDY WHICH WAS NEGATIVE   Depression    GERD (gastroesophageal reflux disease)    OCC- NO MEDS   Migraine    MIGRAINES   Mood swings    Thyroid nodule     Patient Active Problem List   Diagnosis Date Noted   Depression due to physical illness 06/08/2022   Chronic migraine without aura, with intractable migraine, so stated, with  status migrainosus 11/22/2017   Hypersomnia with sleep apnea 06/15/2017   Sleep disorder with mood complaints 06/15/2017   Inadequate sleep hygiene 06/15/2017   Bruxism, sleep-related 06/15/2017   Snoring 06/15/2017   Chronic migraine without aura, with status migrainosus 08/16/2016   Dysmenorrhea 08/20/2015    Past Surgical History:  Procedure Laterality Date   APPENDECTOMY     BREAST BIOPSY Left 09/18/2017   [place] core 10:00 FIBROEPITHELIAL LESION   BREAST BIOPSY Right 09/04/2019   affirm bx of distortion,  x marker, radial scar   BREAST EXCISIONAL BIOPSY Left 10/06/2017   lumpectomy with NP- fibroadeoma   BREAST LUMPECTOMY Right 10/03/2019   radial scar   BREAST LUMPECTOMY WITH NEEDLE LOCALIZATION Left 10/06/2017   Procedure: BREAST LUMPECTOMY WITH NEEDLE LOCALIZATION;  Surgeon: Leonie Green, MD;  Location: [facility];  Service: General;   Laterality: Left;   COLONOSCOPY     DILITATION & CURRETTAGE/HYSTROSCOPY WITH NOVASURE ABLATION N/A 07/17/2015   Procedure: DILATATION & CURETTAGE/HYSTEROSCOPY WITH NOVASURE ABLATION;  Surgeon: Aletha Halim, MD;  Location: [facility];  Service: Gynecology;  Laterality: N/A;   EYE SURGERY     lasik   LAPAROSCOPY ABDOMEN DIAGNOSTIC     LASIK     PARTIAL MASTECTOMY WITH NEEDLE LOCALIZATION Right 10/03/2019   Procedure: PARTIAL MASTECTOMY WITH NEEDLE LOCALIZATION;  Surgeon: Benjamine Sprague, DO;  Location: [facility];  Service: General;  Laterality: Right;   TUBAL LIGATION     WISDOM TOOTH EXTRACTION      Current Outpatient Medications  Medication Sig Dispense Refill   amphetamine-dextroamphetamine (ADDERALL) 20 MG tablet Take 20 mg by mouth daily.     busPIRone (BUSPAR) 5 MG tablet Take 5 mg by mouth daily.     Multiple Vitamin (MULTIVITAMIN PO) Take by mouth.     [name] 225 MG/1.5ML SOSY INJECT 1 SYRINGE SUBCUTANEOUSLY EVERY 30 DAYS (Patient not taking: Reported on 06/08/2022) 1.5 mL 2   predniSONE (STERAPRED UNI-PAK 21 TAB) 10 MG (21) TBPK tablet Take as packaging directs 21 each 0   No current facility-administered medications for this visit.    Allergies as of 07/14/2022 - Review Complete 07/14/2022  Allergen Reaction Noted   Other  07/09/2015    Vitals: BP (!) 147/90   Pulse (!) 109   Ht '5\' 6"'$  (1.676 m)   Wt 125 lb (56.7 kg)   BMI 20.18 kg/m  Last Weight:  Wt Readings from Last 1 Encounters:  07/14/22 125 lb (56.7 kg)   Last Height:   Ht Readings from Last 1 Encounters:  07/14/22 '5\' 6"'$  (1.676 m)     Physical exam: Exam: Gen: NAD, conversant, well nourised, well groomed                     CV: RRR, no MRG. No Carotid Bruits. No peripheral edema, warm, nontender Eyes: Conjunctivae clear without exudates or hemorrhage  Neuro: Detailed Neurologic Exam  Speech:    Speech is normal; fluent and spontaneous with normal comprehension.  Cognition:    The patient is oriented  to person, place, and time;     recent and remote memory intact;     language fluent;     normal attention, concentration,     fund of knowledge Cranial Nerves:    The pupils are equal, round, and reactive to light. The fundi are normal and spontaneous venous pulsations are present. Visual fields are full to finger confrontation. Extraocular movements are intact. Trigeminal sensation is intact and the muscles of  mastication are normal. The face is symmetric. The palate elevates in the midline. Hearing intact. Voice is normal. Shoulder shrug is normal. The tongue has normal motion without fasciculations.   Coordination:    Normal finger to nose and heel to shin. Normal rapid alternating movements.   Gait:    Heel-toe and tandem gait are normal.   Motor Observation:    No asymmetry, no atrophy, and no involuntary movements noted. Tone:    Normal muscle tone.    Posture:    Posture is normal. normal erect    Strength:    Strength is V/V in the upper and lower limbs.      Sensation: intact to LT, decreased cold slightly distally in the feet, pin prick and vibration were intact     Reflex Exam:  DTR's:    1+ AJs otherwise 2+ throughout    Toes:    The toes are downgoing bilaterally.   Clonus:    Clonus is absent.    Assessment/Plan:  HPI:  Kelly Dorsey is a 46 y.o. female here as requested by Susa Day, MD for multiple issues. She is constantly in pain, she did not show up in time today, showed up past her appointment time and she has a plethora of things to discuss and she is self pay. I will not charge her today, I examined her, she is awaiting cone financial assistance, will refund her money. If she gets cone financial assistance we can have a video call (she lives in [place] 30 minutes difficult to get here). Will wait and see if she gets cone assistance which I suspect she will. She is also in debt to come for thousands of dollars and self pay, will see if cone  assistance comes through if not will try to do over the phone since I have seen and examined her and no charge her today  Cc: Susa Day, MD,  Leonard Downing, MD  Sarina Ill, MD  [facility] [address] [place], [place] [address]  Phone [phone] Fax [phone]

## 2022-10-05 ENCOUNTER — Other Ambulatory Visit: Payer: Self-pay | Admitting: Neurology

## 2022-10-05 ENCOUNTER — Encounter: Payer: Self-pay | Admitting: Neurology

## 2022-10-05 MED ORDER — RIZATRIPTAN BENZOATE 10 MG PO TBDP: 10.0000 mg | ORAL_TABLET | ORAL | 11 refills | Status: DC | PRN

## 2022-11-03 ENCOUNTER — Encounter (INDEPENDENT_AMBULATORY_CARE_PROVIDER_SITE_OTHER): Payer: Self-pay

## 2022-11-14 ENCOUNTER — Ambulatory Visit (INDEPENDENT_AMBULATORY_CARE_PROVIDER_SITE_OTHER): Payer: Medicaid Other | Admitting: Neurology

## 2022-11-14 ENCOUNTER — Encounter: Payer: Self-pay | Admitting: Neurology

## 2022-11-14 VITALS — BP 119/80 | HR 106 | Ht 66.0 in | Wt 129.0 lb

## 2022-11-14 DIAGNOSIS — G43709 Chronic migraine without aura, not intractable, without status migrainosus: Secondary | ICD-10-CM

## 2022-11-14 DIAGNOSIS — H539 Unspecified visual disturbance: Secondary | ICD-10-CM

## 2022-11-14 DIAGNOSIS — R258 Other abnormal involuntary movements: Secondary | ICD-10-CM

## 2022-11-14 DIAGNOSIS — G441 Vascular headache, not elsewhere classified: Secondary | ICD-10-CM

## 2022-11-14 DIAGNOSIS — R51 Headache with orthostatic component, not elsewhere classified: Secondary | ICD-10-CM

## 2022-11-14 DIAGNOSIS — H532 Diplopia: Secondary | ICD-10-CM

## 2022-11-14 DIAGNOSIS — R519 Headache, unspecified: Secondary | ICD-10-CM

## 2022-11-14 DIAGNOSIS — M5382 Other specified dorsopathies, cervical region: Secondary | ICD-10-CM

## 2022-11-14 DIAGNOSIS — M791 Myalgia, unspecified site: Secondary | ICD-10-CM

## 2022-11-14 DIAGNOSIS — R4701 Aphasia: Secondary | ICD-10-CM

## 2022-11-14 DIAGNOSIS — M542 Cervicalgia: Secondary | ICD-10-CM | POA: Diagnosis not present

## 2022-11-14 DIAGNOSIS — R4189 Other symptoms and signs involving cognitive functions and awareness: Secondary | ICD-10-CM

## 2022-11-14 DIAGNOSIS — R202 Paresthesia of skin: Secondary | ICD-10-CM

## 2022-11-14 DIAGNOSIS — G8929 Other chronic pain: Secondary | ICD-10-CM

## 2022-11-14 DIAGNOSIS — R292 Abnormal reflex: Secondary | ICD-10-CM

## 2022-11-14 DIAGNOSIS — R252 Cramp and spasm: Secondary | ICD-10-CM

## 2022-11-14 DIAGNOSIS — R200 Anesthesia of skin: Secondary | ICD-10-CM

## 2022-11-14 NOTE — Progress Notes (Addendum)
[facility]    Provider:  Dr Jaynee Eagles Referring Provider: Leonard Downing, * Primary Care Physician:  Leonard Downing, MD    CC:  Migraines  Addendum 01/12/2023: Vitamin b6 and vitamin D was low I sent in prescriptions and a note to the pcp: Sent this note to primary care: Dr. Arelia Sneddon, her vitamin D was low and so was her b6. I prescribed Foltx and voitamin D prescriptions and ask that she follows with you about it. Otherwise no idea why this patient has so many neurologic and somatic problems she may need cognitive behavioral therapy thanks emg/ncs was normal  11/14/2022: This is a patient we have seen in the past. See saw orthopaedics, rheumatology and then here. Did extremeley well on Botox. 3 sessions and went from 16 migraine days a month hardly anything. Couldn;t continue of loss of insurance. Tried [name] which gave her muscle pain. Last time [name] was 2021. Migraines are in the forehead sometimes on the left, double vision, moderate to severe in pain, positional worse when walking up, can't even more when she has then, light and sound sensitivity, nausea, vomiting, can't function. Daily headaches for > 6 months and at least 16 migraine days a month that can last 24-72 hours. She literally cannot funcion, has to stay in bed, can;t life head, neck weakness, chronic neck pain, numbness nd tingling in the fingers and toes, weakness proximally, weakness, weakness arms and legs, failed conservative treatment and been under the care of doctors for years, tried anti-inflammatories, muscle relaxers, PT for > 3 months, heating, stretching. She has heat and cold intolerance. Numbness and tingling all throughout her body. Upper arms, lower arms, feet, hands. Feels like firwroks and cramps in the calves and feet. Brain fog, aphasia. She lays in the bed, she can;t find her words, vision is blurry, affecteing her life can;t function can't work. No other focal neurologic deficits,  associated symptoms, inciting events or modifiable factors. No aura. No medication overuse.   Patient complains of symptoms per HPI as well as the following symptoms: migraines . Pertinent negatives and positives per HPI. All others negative    Interval history April 17, 2019: This is a patient with refractory migraines, tried multiple medications including Botox.  These medications did not work and she was having at least 16 migraine days a month.  We started her [name] and she is doing extremely well.  Her migraines have significantly improved.  We did discuss her insurance issues, apparently we did get the PA approved until February 2021 but her co-pay for 3 shots is $1000.  At this point she has been using the co-pay card about 6 months the co-pay card should last her about a year, I did go and speak with my nurse regarding this and my nurse came in and also discussed with patient.  She is moving pharmacies we recommend she bring her co-pay card or ask 1 pharmacy to transfer all the information over for her.  She is fine with acute management.  She does not feel at this time that she needs anything else she wants to continue with A.  We discussed acute management and other options but atrophy seems to be working.  She is a current every day smoker I did discuss smoking cessation.  Interval History 11/22/2017: Say her last a year ago, tried botox but she never returned for further treatments. Her migraines are "horrible". She tried botox once but didn;t like the way it made  her feel. She has 20 migraine days a month. She takes a triptan and frequently runs out. No medication overuse. She takes topiramate twice a day. It was recently increased. Her mouth is very dry. No medication overuse. Migraines can last all day, pulsating, unilateral, photo/phonophobia. Nausea. She is quite distressed.no aura.  Meds tried: Rizatriptan, Depakote, propranolol, tramadol, cymbalta, topamax, amitriptyline, imitrex and  multiple others over the last 20+ years, Botox x once (didn't like the way it made her feel).    Addendum 03/2017: Her fiance has noticed that she snores, grinds her teeth and sometimes grasps for air at night. She reports waking up every morning with a headache. [name] that a previous doctor had recommended she have a sleep study but she never had it done. Requested sleep eval.   HPI:  Kelly Dorsey is a 47 y.o. female here as a referral from Dr. Arelia Sneddon for migraines PMHx depression, anxiety, current smoker, migraines. She has had migraines since the age of 7. They can be in the forehead and in the eyes, the main headaches are on the right side, pressure behind the eye, sensitivity to light, sound, smells, has to go into a dark room and sleep. Dark rooms make it better. She has nausea and vomits. Migraines 3-4x a week on average 16 or more a month. No aura. They can last up to 3 days on average 12-16 hours. She has nausea and vomiting. Maxalt helps. Worse with season changes, pressure outside makes it worse. She has kept multiple migraine diaries and no foods were identified. Stress and anxiety makes it worse. Can be severe but on average 10/10 in pain. It comes on quickly, not a slow build up. She takes her maxalt and she lays down it helps but doesn't stop it. She is smoking. She is not interested in taking anything else. She has a FHx of migraines in cousins and grandmother and son.    Meds tried: Rizatriptan, Depakote, propranolol, tramadol, cymbalta, topamax, amitriptyline, imitrex and multiple others over the last 20+ years   Reviewed notes, labs and imaging from outside physicians, which showed:   Reviewed notes from [facility]. 3 migraines daily. She has left work multiple times a week. Pressure in her head daily. On cyclobenzaprine, mixed amphetamines and clonazepam. It appears they tried Depakote which made her depressed, horrible dreams like nightmares but it helped her  headaches and at some point possibly they lowered the dose to 125 daily delayed release. They also tried rizatriptan. Clonazepam. Handwritten notes that I can't read. They tried propranolol.   CBC in October 26 was normal, CMP in October 2016 was unremarkable with normal creatinine 0.88 and normal BUN 8   ROS: dizziness, headache, numbness, agitation otherwise normal  Social History   Socioeconomic History   Marital status: Widowed    Spouse name: Not on file   Number of children: 3   Years of education: 10   Highest education level: Not on file  Occupational History   Occupation: None  Tobacco Use   Smoking status: Every Day    Packs/day: 0.25    Years: 22.00    Total pack years: 5.50    Types: Cigarettes   Smokeless tobacco: Never  Vaping Use   Vaping Use: Never used  Substance and Sexual Activity   Alcohol use: No   Drug use: No   Sexual activity: Not on file  Other Topics Concern   Not on file  Social History Narrative  Drinks about 2 cups of caffeine drinks a day    Right handed   Lives at home with her children & fiance'   Social Determinants of Health   Financial Resource Strain: Not on file  Food Insecurity: Not on file  Transportation Needs: Not on file  Physical Activity: Not on file  Stress: Not on file  Social Connections: Not on file  Intimate Partner Violence: Not on file    Family History  Problem Relation Age of Onset   Hypertension Mother    Lung cancer Mother    Hypertension Father    Diabetes Father    Migraines Father    Heart failure Father    Migraines Brother    Breast cancer Maternal Aunt 52   Breast cancer Paternal Aunt 31   Lupus Maternal Grandmother    Arthritis Maternal Grandmother    Migraines Paternal Grandmother    Breast cancer Paternal Grandmother 39       post meno   Migraines Paternal Grandfather    Breast cancer Cousin 84   Neuropathy Neg Hx     Past Medical History:  Diagnosis Date   Anxiety    Complication  of anesthesia    AFTER BTL IN 2006, BP BECAME VERY ELEVATED-WITH EPIDURAL DURING DELIVERY, PT STARTED VOMITING AN SHAKING AND EPIDURAL HAD TO BE STOPPED-PT STATES WITH WISDOM TEETH SHE STOPPED BREATHING AND WAS SENT FOR SLEEP STUDY WHICH WAS NEGATIVE   Depression    GERD (gastroesophageal reflux disease)    OCC- NO MEDS   Migraine    MIGRAINES   Mood swings    Thyroid nodule     Past Surgical History:  Procedure Laterality Date   APPENDECTOMY     BREAST BIOPSY Left 09/18/2017   [place] core 10:00 FIBROEPITHELIAL LESION   BREAST BIOPSY Right 09/04/2019   affirm bx of distortion,  x marker, radial scar   BREAST EXCISIONAL BIOPSY Left 10/06/2017   lumpectomy with NP- fibroadeoma   BREAST LUMPECTOMY Right 10/03/2019   radial scar   BREAST LUMPECTOMY WITH NEEDLE LOCALIZATION Left 10/06/2017   Procedure: BREAST LUMPECTOMY WITH NEEDLE LOCALIZATION;  Surgeon: Leonie Green, MD;  Location: [facility];  Service: General;  Laterality: Left;   COLONOSCOPY     DILITATION & CURRETTAGE/HYSTROSCOPY WITH NOVASURE ABLATION N/A 07/17/2015   Procedure: DILATATION & CURETTAGE/HYSTEROSCOPY WITH NOVASURE ABLATION;  Surgeon: Aletha Halim, MD;  Location: [facility];  Service: Gynecology;  Laterality: N/A;   EYE SURGERY     lasik   LAPAROSCOPY ABDOMEN DIAGNOSTIC     LASIK     PARTIAL MASTECTOMY WITH NEEDLE LOCALIZATION Right 10/03/2019   Procedure: PARTIAL MASTECTOMY WITH NEEDLE LOCALIZATION;  Surgeon: Benjamine Sprague, DO;  Location: [facility];  Service: General;  Laterality: Right;   TUBAL LIGATION     WISDOM TOOTH EXTRACTION      Current Outpatient Medications  Medication Sig Dispense Refill   amphetamine-dextroamphetamine (ADDERALL) 20 MG tablet Take 20 mg by mouth daily.     busPIRone (BUSPAR) 5 MG tablet Take 5 mg by mouth daily.     clonazePAM (KLONOPIN) 0.5 MG tablet Take 0.5-1.5 mg by mouth at bedtime.     cyclobenzaprine (FLEXERIL) 10 MG tablet Take 10 mg by mouth 3 (three) times daily as  needed.     Multiple Vitamin (MULTIVITAMIN PO) Take by mouth.     rizatriptan (MAXALT-MLT) 10 MG disintegrating tablet Take 1 tablet (10 mg total) by mouth as needed for migraine. May repeat in 2 hours  if needed 12 tablet 11   No current facility-administered medications for this visit.    Allergies as of 11/14/2022 - Review Complete 11/14/2022  Allergen Reaction Noted   Other  07/09/2015    Vitals: BP 119/80   Pulse (!) 106   Ht 5\' 6"  (1.676 m)   Wt 129 lb (58.5 kg)   BMI 20.82 kg/m  Last Weight:  Wt Readings from Last 1 Encounters:  11/14/22 129 lb (58.5 kg)   Last Height:   Ht Readings from Last 1 Encounters:  11/14/22 5\' 6"  (1.676 m)     Physical exam: Exam: Gen: Distressed over migraines                CV: RRR, no MRG. No Carotid Bruits. No peripheral edema, warm, nontender Eyes: Conjunctivae clear without exudates or hemorrhage  Neuro: Detailed Neurologic Exam  Speech:    Speech is normal; fluent and spontaneous with normal comprehension.  Cognition:    The patient is oriented to person, place, and time;     recent and remote memory intact;     language fluent;     normal attention, concentration,     fund of knowledge Cranial Nerves:    The pupils are equal, round, and reactive to light. The fundi are normal and spontaneous venous pulsations are present. Visual fields are full to finger confrontation. Extraocular movements are intact. Trigeminal sensation is intact and the muscles of mastication are normal. The face is symmetric. The palate elevates in the midline. Hearing intact. Voice is normal. Shoulder shrug is normal. The tongue has normal motion without fasciculations.   Coordination:    Normal finger to nose and heel to shin. Normal rapid alternating movements.   Gait:    Heel-toe and tandem gait are normal.   Motor Observation:    No asymmetry, no atrophy, and no involuntary movements noted. Tone:    Normal muscle tone.    Posture:     Posture is normal. normal erect    Strength:    Strength is V/V in the upper and lower limbs.      Sensation: intact to LT     Reflex Exam:  DTR's:    Deep tendon reflexes in the upper and lower extremities are brisk bilaterally.   Toes:    The toes are downgoing bilaterally.   Clonus:    Clonus is present.     Assessment/Plan:  47 year old with chronic migraines without aura with status migrainosus intractabke.  Daily headaches for > 6 months and at least 16 migraine days a month that can last 24-72 hours. Did great on botox but lost insurance. Will restart. Also concerning symptoms needs imaging of the head and neck.   Addendum 01/12/2023: Vitamin b6 and vitamin D was low I sent in prescriptions and a note to the pcp: Sent this note to primary care: Dr. Arelia Sneddon, her vitamin D was low and so was her b6. I prescribed Foltx and voitamin D prescriptions and ask that she follows with you about it. Otherwise no idea why this patient has so many neurologic and somatic problems she may need cognitive behavioral therapy thanks emg/ncs was normal   - She has tried and failed most first line and second line agents over the last 20+ years. Restart botox for migraines - Tried [name], and the new cgrp medications - MRI brain due to concerning symptoms of morning headaches, positional and exertional headaches,vision changes, worsening headaches  to look for space  occupying mass, chiari or intracranial hypertension (pseudotumor), strokes, malignancies, vasculidities, demyelination(multiple sclerosis) or other - MRI cervical spine: Upper motor neuron lesion, has clonus, brisk reflexes, chronic neck pain - Smoking: encouraged cessation, can worsen migraines - bloodwork - muscle pain and cramps: emg/ncs  Orders Placed This Encounter  Procedures   MR BRAIN W WO CONTRAST   MR CERVICAL SPINE WO CONTRAST   CBC with Differential/Platelets   Comprehensive metabolic panel   F81 and Folate Panel    Methylmalonic acid, serum   Vitamin B1   Vitamin B6   Vitamin D, 25-hydroxy   Magnesium   NCV with EMG(electromyography)    Discussed: To prevent or relieve headaches, try the following: Cool Compress. Lie down and place a cool compress on your head.  Avoid headache triggers. If certain foods or odors seem to have triggered your migraines in the past, avoid them. A headache diary might help you identify triggers.  Include physical activity in your daily routine. Try a daily walk or other moderate aerobic exercise.  Manage stress. Find healthy ways to cope with the stressors, such as delegating tasks on your to-do list.  Practice relaxation techniques. Try deep breathing, yoga, massage and visualization.  Eat regularly. Eating regularly scheduled meals and maintaining a healthy diet might help prevent headaches. Also, drink plenty of fluids.  Follow a regular sleep schedule. Sleep deprivation might contribute to headaches Consider biofeedback. With this mind-body technique, you learn to control certain bodily functions -- such as muscle tension, heart rate and blood pressure -- to prevent headaches or reduce headache pain.    Proceed to emergency room if you experience new or worsening symptoms or symptoms do not resolve, if you have new neurologic symptoms or if headache is severe, or for any concerning symptom.   Provided education and documentation from American headache Society toolbox including articles on: chronic migraine medication overuse headache, chronic migraines, prevention of migraines, behavioral and other nonpharmacologic treatments for headache.  Cc: Dr. Molli Knock, MD  [facility] [address], [place] [address]  Phone [phone] Fax [phone] I spent over 105 minutes of face-to-face and non-face-to-face time with patient on the  1. Chronic migraine without aura without status migrainosus, not intractable    2. Diplopia   3. Positional headache   4. Morning headache   5. Chronic neck pain with abnormal neurologic examination   6. Neck muscle weakness   7. Numbness and tingling in both hands   8. Muscle cramps   9. Aphasia   10. Subjective cognitive impairment   11. Vision changes   12. Other vascular headache   13. Clonus   14. Abnormal DTR (deep tendon reflex)   15. Worsening headaches   16. Muscle pain    diagnosis.  This included previsit chart review, lab review, study review, order entry, electronic health record documentation, patient education on the different diagnostic and therapeutic options, counseling and coordination of care, risks and benefits of management, compliance, or risk factor reduction

## 2022-11-14 NOTE — Patient Instructions (Addendum)
MRI of the brain MRI cervical spine Blood work Emg/ncs  Botox approval    Electromyoneurogram Electromyoneurogram is a test to check how well your muscles and nerves are working. This procedure includes the combined use of electromyogram (EMG) and nerve conduction study (NCS). EMG is used to evaluate muscles and the nerves that control those muscles. NCS, which is also called electroneurogram, measures how well your nerves conduct electricity. The procedures should be done together to check if your muscles and nerves are healthy. If the results of the tests are abnormal, this may indicate disease or injury, such as a neuromuscular disease or peripheral nerve damage. Tell a health care provider about: Any allergies you have. All medicines you are taking, including vitamins, herbs, eye drops, creams, and over-the-counter medicines. Any bleeding problems you have. Any surgeries you have had. Any medical conditions you have. What are the risks? Generally, this is a safe procedure. However, problems may occur, including: Bleeding or bruising. Infection where the electrodes were inserted. What happens before the test? Medicines Take all of your usually prescribed medications before this testing is performed. Do not stop your blood thinners unless advised by your prescribing physician. General instructions Your health care provider may ask you to warm the limb that will be checked with warm water, hot pack, or wrapping the limb in a blanket. Do not use lotions or creams on the same day that you will be having the procedure. What happens during the test? For EMG  Your health care provider will ask you to stay in a position so that the muscle being studied can be accessed. You will be sitting or lying down. You may be given a medicine to numb the area (local anesthetic) and the skin will be disinfected. A very thin needle that has an electrode will be inserted into your muscle, one muscle at a  time. Typically, multiple muscles are evaluated during a single study. Another small electrode will be placed on your skin near the muscle. Your health care provider will ask you to continue to remain still. The electrodes will record the electrical activity of your muscles. You may see this on a monitor or hear it in the room. After your muscles have been studied at rest, your health care provider will ask you to contract or flex your muscles. The electrodes will record the electrical activity of your muscles. Your health care provider will remove the electrodes and the electrode needle when the procedure is finished. The procedure may vary among health care providers and hospitals. For NCS  An electrode that records your nerve activity (recording electrode) will be placed on your skin by the muscle that is being studied. An electrode that is used as a reference (reference electrode) will be placed near the recording electrode. A paste or gel will be applied to your skin between the recording electrode and the reference electrode. Your nerve will be stimulated with a mild shock. The speed of the nerves and strength of response is recorded by the electrodes. Your health care provider will remove the electrodes and the gel when the procedure is finished. The procedure may vary among health care providers and hospitals. What can I expect after the test? It is up to you to get your test results. Ask your health care provider, or the department that is doing the test, when your results will be ready. Your health care provider may: Give you medicines for any pain. Monitor the insertion sites to make sure that  bleeding stops. You should be able to drive yourself to and from the test. Discomfort can persist for a few hours after the test, but should be better the next day. Contact a health care provider if: You have swelling, redness, or drainage at any of the insertion  sites. Summary Electromyoneurogram is a test to check how well your muscles and nerves are working. If the results of the tests are abnormal, this may indicate disease or injury. This is a safe procedure. However, problems may occur, such as bleeding and infection. Your health care provider will do two tests to complete this procedure. One checks your muscles (EMG) and another checks your nerves (NCS). It is up to you to get your test results. Ask your health care provider, or the department that is doing the test, when your results will be ready. This information is not intended to replace advice given to you by your health care provider. Make sure you discuss any questions you have with your health care provider. Document Revised: 06/23/2021 Document Reviewed: 05/23/2021 Elsevier Patient Education  [place].

## 2022-11-15 ENCOUNTER — Encounter: Payer: Self-pay | Admitting: Neurology

## 2022-11-15 DIAGNOSIS — G43709 Chronic migraine without aura, not intractable, without status migrainosus: Secondary | ICD-10-CM | POA: Insufficient documentation

## 2022-11-16 ENCOUNTER — Telehealth: Payer: Self-pay | Admitting: *Deleted

## 2022-11-16 NOTE — Telephone Encounter (Signed)
Chronic Migraine CPT 64615  Botox J0585 Units:200  G43.709 Chronic Migraine without aura, not intractable, without status migrainous  Need PA for new start. Please confirm if SP or B/B  Jillian/Kayelee, please place patient on NP schedule about 4 weeks out. Let her know that certainly if we can get her botox approved and it's either B/B or comes quickly from [facility] we may be able to see her sooner.

## 2022-11-16 NOTE — Telephone Encounter (Signed)
Scheduled for 12/14/2022 at 9:45 am.

## 2022-11-17 ENCOUNTER — Other Ambulatory Visit (HOSPITAL_COMMUNITY): Payer: Self-pay

## 2022-11-17 NOTE — Telephone Encounter (Signed)
Pharmacy Patient Advocate Encounter   Received notification from [place] that prior authorization for Botox 200UNIT solution is required/requested.    PA submitted on 11/17/2022 to (ins) [facility] Medicaid of [place] via CoverMyMeds Key [id number] Status is pending

## 2022-11-18 ENCOUNTER — Other Ambulatory Visit (HOSPITAL_COMMUNITY): Payer: Self-pay

## 2022-11-18 NOTE — Telephone Encounter (Signed)
Pharmacy Patient Advocate Encounter  Prior Authorization for Botox 200UNIT solution has been approved.    PA# PA Case ID #: [id number] Effective dates: 11/17/2022 through 05/16/2023  Per test billing the copay is $4.00  This can be filled with [facility]

## 2022-11-23 ENCOUNTER — Telehealth: Payer: Self-pay | Admitting: Neurology

## 2022-11-23 ENCOUNTER — Ambulatory Visit: Payer: Self-pay | Admitting: Neurology

## 2022-11-23 NOTE — Telephone Encounter (Signed)
[employer] medicaid Josem Kaufmann: [id number] exp. 11/21/2022-01/20/2023 sent to GI [phone]

## 2022-12-02 ENCOUNTER — Other Ambulatory Visit: Payer: Self-pay | Admitting: Family Medicine

## 2022-12-02 DIAGNOSIS — N644 Mastodynia: Secondary | ICD-10-CM

## 2022-12-05 ENCOUNTER — Ambulatory Visit
Admission: RE | Admit: 2022-12-05 | Discharge: 2022-12-05 | Disposition: A | Discharge status: Home / Self Care | Payer: Medicaid Other | Source: Ambulatory Visit | Attending: Family Medicine | Admitting: Family Medicine

## 2022-12-05 DIAGNOSIS — N644 Mastodynia: Secondary | ICD-10-CM

## 2022-12-07 ENCOUNTER — Ambulatory Visit
Admission: RE | Admit: 2022-12-07 | Discharge: 2022-12-07 | Disposition: A | Discharge status: Home / Self Care | Payer: Medicaid Other | Source: Ambulatory Visit | Attending: Neurology | Admitting: Neurology

## 2022-12-07 DIAGNOSIS — H539 Unspecified visual disturbance: Secondary | ICD-10-CM

## 2022-12-07 DIAGNOSIS — R258 Other abnormal involuntary movements: Secondary | ICD-10-CM

## 2022-12-07 DIAGNOSIS — M5382 Other specified dorsopathies, cervical region: Secondary | ICD-10-CM

## 2022-12-07 DIAGNOSIS — R292 Abnormal reflex: Secondary | ICD-10-CM

## 2022-12-07 DIAGNOSIS — M542 Cervicalgia: Secondary | ICD-10-CM

## 2022-12-07 DIAGNOSIS — R202 Paresthesia of skin: Secondary | ICD-10-CM

## 2022-12-07 DIAGNOSIS — R51 Headache with orthostatic component, not elsewhere classified: Secondary | ICD-10-CM

## 2022-12-07 DIAGNOSIS — R4189 Other symptoms and signs involving cognitive functions and awareness: Secondary | ICD-10-CM

## 2022-12-07 DIAGNOSIS — R519 Headache, unspecified: Secondary | ICD-10-CM

## 2022-12-07 DIAGNOSIS — G441 Vascular headache, not elsewhere classified: Secondary | ICD-10-CM

## 2022-12-07 DIAGNOSIS — R4701 Aphasia: Secondary | ICD-10-CM

## 2022-12-07 DIAGNOSIS — R200 Anesthesia of skin: Secondary | ICD-10-CM | POA: Diagnosis not present

## 2022-12-07 DIAGNOSIS — H532 Diplopia: Secondary | ICD-10-CM

## 2022-12-07 MED ORDER — GADOPICLENOL 0.5 MMOL/ML IV SOLN
6.0000 mL | Freq: Once | INTRAVENOUS | Status: AC | PRN
  Administered 2022-12-07: 1 mL via INTRAVENOUS

## 2022-12-07 MED ORDER — GADOPICLENOL 0.5 MMOL/ML IV SOLN
6.0000 mL | Freq: Once | INTRAVENOUS | Status: AC | PRN
  Administered 2022-12-07: 6 mL via INTRAVENOUS

## 2022-12-08 ENCOUNTER — Encounter: Payer: Self-pay | Admitting: Neurology

## 2022-12-08 NOTE — Progress Notes (Addendum)
Kelly Dorsey, there have been no changes to your brain since MRI 2017. But you do have some "low lying cerebellar tonsils" not enough that they should give you symptoms and not bad enough to be called a chiari 1 malformation but I'll send you a mychart email to discuss. 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  This is not severe enough to be considered a Chiari type I malformation."

## 2022-12-12 ENCOUNTER — Other Ambulatory Visit: Payer: Self-pay

## 2022-12-12 ENCOUNTER — Other Ambulatory Visit (HOSPITAL_COMMUNITY): Payer: Self-pay

## 2022-12-12 MED ORDER — ONABOTULINUMTOXINA 200 UNITS IJ SOLR
INTRAMUSCULAR | 1 refills | Status: DC
  Filled 2022-12-12: qty 1, 84d supply, fill #0
  Filled 2023-03-03: qty 1, 84d supply, fill #1

## 2022-12-12 NOTE — Addendum Note (Signed)
Addended by: Gildardo Griffes on: 12/12/2022 09:31 AM   Modules accepted: Orders

## 2022-12-12 NOTE — Telephone Encounter (Signed)
Botox order sent to [facility].

## 2022-12-12 NOTE — Telephone Encounter (Signed)
Please send Rx to [facility] as soon as you can, thank you!

## 2022-12-14 ENCOUNTER — Encounter: Payer: Self-pay | Admitting: Adult Health

## 2022-12-14 ENCOUNTER — Ambulatory Visit: Payer: Medicaid Other | Admitting: Adult Health

## 2022-12-14 VITALS — BP 124/88 | HR 100

## 2022-12-14 DIAGNOSIS — G43709 Chronic migraine without aura, not intractable, without status migrainosus: Secondary | ICD-10-CM

## 2022-12-14 MED ORDER — ONABOTULINUMTOXINA 200 UNITS IJ SOLR
155.0000 [IU] | Freq: Once | INTRAMUSCULAR | Status: AC
  Administered 2022-12-14: 155 [IU] via INTRAMUSCULAR

## 2022-12-14 NOTE — Progress Notes (Signed)
Botox- 200 units x 1 vial Lot: [id number] Expiration: 03/2025 NDC: [id number]   Bacteriostatic 0.9% Sodium Chloride- 40m total [id number]: 11/25 NDC: [id number]  Dx: [id number]

## 2022-12-14 NOTE — Progress Notes (Signed)
Update 12/14/2022 JM: Patient is being seen for Botox injection.  Migraines the same from when she was last seen by Dr. Jaynee Eagles 1 month ago.  Tolerated procedure well today.  Will return in 3 months for repeat injection.  She is scheduled to undergo EMG/NCV on 3/14 as well as completion of lab work, PCP requesting she also have hep B, ESR and CRP completed at that time.    Consent Form Botulism Toxin Injection For Chronic Migraine    Reviewed orally with patient, additionally signature is on file:  Botulism toxin has been approved by the Federal drug administration for treatment of chronic migraine. Botulism toxin does not cure chronic migraine and it may not be effective in some patients.  The administration of botulism toxin is accomplished by injecting a small amount of toxin into the muscles of the neck and head. Dosage must be titrated for each individual. Any benefits resulting from botulism toxin tend to wear off after 3 months with a repeat injection required if benefit is to be maintained. Injections are usually done every 3-4 months with maximum effect peak achieved by about 2 or 3 weeks. Botulism toxin is expensive and you should be sure of what costs you will incur resulting from the injection.  The side effects of botulism toxin use for chronic migraine may include:   -Transient, and usually mild, facial weakness with facial injections  -Transient, and usually mild, head or neck weakness with head/neck injections  -Reduction or loss of forehead facial animation due to forehead muscle weakness  -Eyelid drooping  -Dry eye  -Pain at the site of injection or bruising at the site of injection  -Double vision  -Potential unknown long term risks   Contraindications: You should not have Botox if you are pregnant, nursing, allergic to albumin, have an infection, skin condition, or muscle weakness at the site of the injection, or have myasthenia gravis, Lambert-Eaton syndrome, or  ALS.  It is also possible that as with any injection, there may be an allergic reaction or no effect from the medication. Reduced effectiveness after repeated injections is sometimes seen and rarely infection at the injection site may occur. All care will be taken to prevent these side effects. If therapy is given over a long time, atrophy and wasting in the muscle injected may occur. Occasionally the patient's become refractory to treatment because they develop antibodies to the toxin. In this event, therapy needs to be modified.  I have read the above information and consent to the administration of botulism toxin.    BOTOX PROCEDURE NOTE FOR MIGRAINE HEADACHE  Contraindications and precautions discussed with patient(above). Aseptic procedure was observed and patient tolerated procedure. Procedure performed by Frann Rider, AGNP-BC.   The condition has existed for more than 6 months, and pt does not have a diagnosis of ALS, Myasthenia Gravis or Lambert-Eaton Syndrome.  Risks and benefits of injections discussed and pt agrees to proceed with the procedure.  Written consent obtained  These injections are medically necessary. Pt  receives good benefits from these injections. These injections do not cause sedations or hallucinations which the oral therapies may cause.   Description of procedure:  The patient was placed in a sitting position. The standard protocol was used for Botox as follows, with 5 units of Botox injected at each site:  -Procerus muscle, midline injection  -Corrugator muscle, bilateral injection  -Frontalis muscle, bilateral injection, with 2 sites each side, medial injection was performed in the upper  one third of the frontalis muscle, in the region vertical from the medial inferior edge of the superior orbital rim. The lateral injection was again in the upper one third of the forehead vertically above the lateral limbus of the cornea, 1.5 cm lateral to the medial  injection site.  -Temporalis muscle injection, 4 sites, bilaterally. The first injection was 3 cm above the tragus of the ear, second injection site was 1.5 cm to 3 cm up from the first injection site in line with the tragus of the ear. The third injection site was 1.5-3 cm forward between the first 2 injection sites. The fourth injection site was 1.5 cm posterior to the second injection site. 5th site laterally in the temporalis  muscleat the level of the outer canthus.  -Occipitalis muscle injection, 3 sites, bilaterally. The first injection was done one half way between the occipital protuberance and the tip of the mastoid process behind the ear. The second injection site was done lateral and superior to the first, 1 fingerbreadth from the first injection. The third injection site was 1 fingerbreadth superiorly and medially from the first injection site.  -Cervical paraspinal muscle injection, 2 sites, bilaterally. The first injection site was 1 cm from the midline of the cervical spine, 3 cm inferior to the lower border of the occipital protuberance. The second injection site was 1.5 cm superiorly and laterally to the first injection site.  -Trapezius muscle injection was performed at 3 sites, bilaterally. The first injection site was in the upper trapezius muscle halfway between the inflection point of the neck, and the acromion. The second injection site was one half way between the acromion and the first injection site. The third injection was done between the first injection site and the inflection point of the neck.    A total of 200 units of Botox was prepared, 155 units of Botox was injected as documented above, any Botox not injected was wasted. The patient tolerated the procedure well, there were no complications of the above procedure.   Frann Rider, AGNP-BC  [facility] [address] [place], [place] [address]  Phone [phone] Fax  [phone] Note: This document was prepared with digital dictation and possible smart phrase technology. Any transcriptional errors that result from this process are unintentional.

## 2022-12-21 ENCOUNTER — Ambulatory Visit: Payer: Medicaid Other | Admitting: Neurology

## 2023-01-05 ENCOUNTER — Ambulatory Visit: Payer: Medicaid Other | Admitting: Neurology

## 2023-01-05 VITALS — BP 115/81 | HR 122 | Ht 66.0 in | Wt 126.0 lb

## 2023-01-05 DIAGNOSIS — H532 Diplopia: Secondary | ICD-10-CM

## 2023-01-05 DIAGNOSIS — R4701 Aphasia: Secondary | ICD-10-CM

## 2023-01-05 DIAGNOSIS — R292 Abnormal reflex: Secondary | ICD-10-CM

## 2023-01-05 DIAGNOSIS — R51 Headache with orthostatic component, not elsewhere classified: Secondary | ICD-10-CM

## 2023-01-05 DIAGNOSIS — R29898 Other symptoms and signs involving the musculoskeletal system: Secondary | ICD-10-CM

## 2023-01-05 DIAGNOSIS — R519 Headache, unspecified: Secondary | ICD-10-CM

## 2023-01-05 DIAGNOSIS — G8929 Other chronic pain: Secondary | ICD-10-CM

## 2023-01-05 DIAGNOSIS — R4189 Other symptoms and signs involving cognitive functions and awareness: Secondary | ICD-10-CM

## 2023-01-05 DIAGNOSIS — G43709 Chronic migraine without aura, not intractable, without status migrainosus: Secondary | ICD-10-CM

## 2023-01-05 DIAGNOSIS — M542 Cervicalgia: Secondary | ICD-10-CM

## 2023-01-05 DIAGNOSIS — H539 Unspecified visual disturbance: Secondary | ICD-10-CM

## 2023-01-05 DIAGNOSIS — M791 Myalgia, unspecified site: Secondary | ICD-10-CM

## 2023-01-05 DIAGNOSIS — R252 Cramp and spasm: Secondary | ICD-10-CM

## 2023-01-05 DIAGNOSIS — M5382 Other specified dorsopathies, cervical region: Secondary | ICD-10-CM | POA: Diagnosis not present

## 2023-01-05 DIAGNOSIS — R200 Anesthesia of skin: Secondary | ICD-10-CM

## 2023-01-05 DIAGNOSIS — R258 Other abnormal involuntary movements: Secondary | ICD-10-CM

## 2023-01-05 DIAGNOSIS — R202 Paresthesia of skin: Secondary | ICD-10-CM

## 2023-01-05 DIAGNOSIS — G441 Vascular headache, not elsewhere classified: Secondary | ICD-10-CM

## 2023-01-05 NOTE — Progress Notes (Signed)
Full Name: Delphene Rainford Gender: Female MRN #: [id number] Date of Birth: 1976-05-19    Visit Date: 01/05/2023 13:25 Age: 47 Years Examining Physician: Dr. Sarina Ill Referring Physician: Dr. Sarina Ill Height: 5 feet 6 inch    History: 47 year old with reported severe fatigue and weakness. She states that she literally cannot funcion, has to stay in bed, can't life head, neck weakness, chronic neck pain, numbness and tingling in the fingers and toes, weakness proximally arms and legs, Numbness and tingling all throughout her body. Upper arms, lower arms, feet, hands. Feels like firwroks and cramps in the calves and feet. MRI of the brain unremarkable (mild cerebellar ectopia but not severe enough to be called a chiari malformation), MRI cervical spine also unremarkable.   Summary: EMG/NCS was performed on the left upper and left lower extremities. All nerves and muscles (as indicated in the following tables) were within normal limits.     Conclusion: This is a normal study of the left upper and left lower extremities.  No electrophysiologic evidence of mononeuropathy, polyneuropathy, radiculopathy or neurogenic muscle disease.  Sarina Ill M.D.  [facility] [address] [place], [place] [address] Tel: [phone] Fax: [phone]  Verbal informed consent was obtained from the patient, patient was informed of potential risk of procedure, including bruising, bleeding, hematoma formation, infection, muscle weakness, muscle pain, numbness, among others.        [place]    Nerve / Sites Muscle Latency Ref. Amplitude Ref. Rel Amp Segments Distance Velocity Ref. Area    ms ms mV mV %  cm m/s m/s mVms  L Median - APB     Wrist APB 2.7 ?4.4 9.4 ?4.0 100 Wrist - APB 7   42.2     Upper arm APB 7.2  10.3  109 Upper arm - Wrist 27 60 ?49 43.5  R Median - APB     Wrist APB 3.0 ?4.4 10.2 ?4.0 100 Wrist - APB 7   33.2     Upper arm APB 7.4  11.1  109  Upper arm - Wrist 26 59 ?49 40.3  L Ulnar - ADM     Wrist ADM 2.6 ?3.3 15.6 ?6.0 100 Wrist - ADM 7   44.6     B.Elbow ADM 4.6  15.3  98 B.Elbow - Wrist 12 59 ?49 42.7     A.Elbow ADM 7.5  14.1  92.1 A.Elbow - B.Elbow 17 59 ?49 40.2  R Ulnar - ADM     Wrist ADM 2.5 ?3.3 15.7 ?6.0 100 Wrist - ADM 7   44.2     B.Elbow ADM 4.7  13.7  87 B.Elbow - Wrist 14 63 ?49 40.5     A.Elbow ADM 7.2  15.4  112 A.Elbow - B.Elbow 16 64 ?49 44.4             SNC    Nerve / Sites Rec. Site Peak Lat Ref.  Amp Ref. Segments Distance Peak Diff Ref.    ms ms V V  cm ms ms  L Sural - Ankle (Calf) (1)     Calf Ankle 3.6 ?4.4 19 ?6 Calf - Ankle 14    L Superficial peroneal - Ankle     Lat leg Ankle 3.9 ?4.4 12 ?6 Lat leg - Ankle 14    L Median, Ulnar - Transcarpal comparison     Median Palm Wrist 1.8 ?2.2 57 ?35 Median Palm - Wrist  8       Ulnar Palm Wrist 1.5 ?2.2 20 ?12 Ulnar Palm - Wrist 8          Median Palm - Ulnar Palm  0.3 ?0.4  R Median, Ulnar - Transcarpal comparison     Median Palm Wrist 2.0 ?2.2 43 ?35 Median Palm - Wrist 8       Ulnar Palm Wrist 1.9 ?2.2 24 ?12 Ulnar Palm - Wrist 8          Median Palm - Ulnar Palm  0.1 ?0.4  L Median - Orthodromic (Dig II, Mid palm)     Dig II Wrist 2.7 ?3.4 18 ?10 Dig II - Wrist 13    R Median - Orthodromic (Dig II, Mid palm)     Dig II Wrist 3.0 ?3.4 20 ?10 Dig II - Wrist 13    L Ulnar - Orthodromic, (Dig V, Mid palm)     Dig V Wrist 2.5 ?3.1 9 ?5 Dig V - Wrist 11    R Ulnar - Orthodromic, (Dig V, Mid palm)     Dig V Wrist 2.7 ?3.1 10 ?5 Dig V - Wrist 74                       F  Wave    Nerve F Lat Ref.   ms ms  L Ulnar - ADM 26.9 ?32.0  R Ulnar - ADM 23.5 ?32.0         EMG Summary Table    Spontaneous MUAP Recruitment  Muscle IA Fib PSW Fasc Other Amp Dur. Poly Pattern  L. Cervical paraspinals (low) Normal None None None _______ Normal Normal Normal Normal  L. Deltoid Normal None None None _______ Normal Normal Normal Normal  L. Biceps brachii  Normal None None None _______ Normal Normal Normal Normal  L. Triceps brachii Normal None None None _______ Normal Normal Normal Normal  L. Pronator teres Normal None None None _______ Normal Normal Normal Normal  L. First dorsal interosseous Normal None None None _______ Normal Normal Normal Normal  L. Extensor digitorum communis Normal None None None _______ Normal Normal Normal Normal  L. Vastus medialis Normal None None None _______ Normal Normal Normal Normal  L. Tibialis anterior Normal None None None _______ Normal Normal Normal Normal  L. Gastrocnemius (Medial head) Normal None None None _______ Normal Normal Normal Normal  L. Extensor hallucis longus Normal None None None _______ Normal Normal Normal Normal  L. Abductor hallucis Normal None None None _______ Normal Normal Normal Normal

## 2023-01-05 NOTE — Progress Notes (Signed)
History: Patient here for EMG/NCS. 47 year old with reported severe fatigue and weakness. She states that she literally cannot funcion, has to stay in bed, can't life head, neck weakness, chronic neck pain, numbness and tingling in the fingers and toes, weakness proximally arms and legs, Numbness and tingling all throughout her body. Upper arms, lower arms, feet, hands. Feels like firwroks and cramps in the calves and feet. MRI of the brain unremarkable (mild cerebellar ectopia but not severe enough to be called a chiari malformation), MRI cervical spine also unremarkable.   I reviewed workup to date with patient and her partner. Today we are ordering labs. Repeat MRI brain stable and unremarkable; no findings to explain symptoms. MRI cervical spine unremarkable. EMG/NCS normal.  She has had extensive lab testing in the past and seen multiple specialists, but we ordered a few more labs today including hep B, CRP, sed rate, magnesium, vitamin D, vitamin B6, vitamin B1, MMA, B12 and folate, CMP, CBC, CK, myasthenia gravis.  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  This is not severe enough to be considered a Chiari type I malformation.  Adjacent brainstem appears normal.  The extent of cerebellar ectopia is unchanged compared to the CT scan from 06/08/2022. Parenchymal signal is normal.   No acute findings.  Normal enhancement pattern.  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  This is not severe enough to be considered a Chiari type I malformation. The spinal cord appears normal. At C5-C6, there is a left paramedian disc protrusion indenting the thecal sac without causing significant spinal stenosis.  There is no  nerve root compression. At C6-C7, there is broad disc protrusion and minimal uncovertebral spurring causing mild spinal stenosis.  There is mild bilateral foraminal narrowing but no nerve root compression.  I recommended she return to primary care for further evaluation possibly cardiac or for malignancy (smoker) or other as clinically waranted by Dr. Arelia Sneddon. No neurologic cause of her symptoms were found. Discussed all of this with patient and her partner.  I spent 20 minutes of face-to-face and non-face-to-face time with patient on the  1. Neck muscle weakness   2. Numbness and tingling in both hands   3. Muscle cramps   4. Limb weakness   5. Chronic migraine without aura without status migrainosus, not intractable   6. Muscle pain   7. Diplopia   8. Positional headache   9. Morning headache   10. Chronic neck pain with abnormal neurologic examination   11. Aphasia   12. Subjective cognitive impairment   13. Vision changes   14. Other vascular headache   15. Clonus   16. Abnormal DTR (deep tendon reflex)   17. Worsening headaches    diagnosis.  This included previsit chart review, lab review, study review, order entry, electronic health record documentation, patient education on the different diagnostic and therapeutic options, counseling and coordination of care, risks and benefits of management, compliance, or risk factor reduction. This does not include time spent on emg/ncs.

## 2023-01-05 NOTE — Procedures (Signed)
Full Name: Kelly Dorsey Gender: Female MRN #: [id number] Date of Birth: Aug 20, 1976    Visit Date: 01/05/2023 13:25 Age: 47 Years Examining Physician: Dr. Sarina Ill Referring Physician: Dr. Sarina Ill Height: 5 feet 6 inch    History: 47 year old with reported severe fatigue and weakness. She states that she literally cannot funcion, has to stay in bed, can't life head, neck weakness, chronic neck pain, numbness and tingling in the fingers and toes, weakness proximally arms and legs, Numbness and tingling all throughout her body. Upper arms, lower arms, feet, hands. Feels like firwroks and cramps in the calves and feet. MRI of the brain unremarkable (mild cerebellar ectopia but not severe enough to be called a chiari malformation), MRI cervical spine also unremarkable.   Summary: EMG/NCS was performed on the left upper and left lower extremities. All nerves and muscles (as indicated in the following tables) were within normal limits.     Conclusion: This is a normal study of the left upper and left lower extremities.  No electrophysiologic evidence of mononeuropathy, polyneuropathy, radiculopathy or neurogenic muscle disease.  Sarina Ill M.D.  [facility] [address] [place], [place] [address] Tel: [phone] Fax: [phone]  Verbal informed consent was obtained from the patient, patient was informed of potential risk of procedure, including bruising, bleeding, hematoma formation, infection, muscle weakness, muscle pain, numbness, among others.        [place]    Nerve / Sites Muscle Latency Ref. Amplitude Ref. Rel Amp Segments Distance Velocity Ref. Area    ms ms mV mV %  cm m/s m/s mVms  L Median - APB     Wrist APB 2.7 ?4.4 9.4 ?4.0 100 Wrist - APB 7   42.2     Upper arm APB 7.2  10.3  109 Upper arm - Wrist 27 60 ?49 43.5  R Median - APB     Wrist APB 3.0 ?4.4 10.2 ?4.0 100 Wrist - APB 7   33.2     Upper arm APB 7.4  11.1  109  Upper arm - Wrist 26 59 ?49 40.3  L Ulnar - ADM     Wrist ADM 2.6 ?3.3 15.6 ?6.0 100 Wrist - ADM 7   44.6     B.Elbow ADM 4.6  15.3  98 B.Elbow - Wrist 12 59 ?49 42.7     A.Elbow ADM 7.5  14.1  92.1 A.Elbow - B.Elbow 17 59 ?49 40.2  R Ulnar - ADM     Wrist ADM 2.5 ?3.3 15.7 ?6.0 100 Wrist - ADM 7   44.2     B.Elbow ADM 4.7  13.7  87 B.Elbow - Wrist 14 63 ?49 40.5     A.Elbow ADM 7.2  15.4  112 A.Elbow - B.Elbow 16 64 ?49 44.4             SNC    Nerve / Sites Rec. Site Peak Lat Ref.  Amp Ref. Segments Distance Peak Diff Ref.    ms ms V V  cm ms ms  L Sural - Ankle (Calf) (1)     Calf Ankle 3.6 ?4.4 19 ?6 Calf - Ankle 14    L Superficial peroneal - Ankle     Lat leg Ankle 3.9 ?4.4 12 ?6 Lat leg - Ankle 14    L Median, Ulnar - Transcarpal comparison     Median Palm Wrist 1.8 ?2.2 57 ?35 Median Palm - Wrist  8       Ulnar Palm Wrist 1.5 ?2.2 20 ?12 Ulnar Palm - Wrist 8          Median Palm - Ulnar Palm  0.3 ?0.4  R Median, Ulnar - Transcarpal comparison     Median Palm Wrist 2.0 ?2.2 43 ?35 Median Palm - Wrist 8       Ulnar Palm Wrist 1.9 ?2.2 24 ?12 Ulnar Palm - Wrist 8          Median Palm - Ulnar Palm  0.1 ?0.4  L Median - Orthodromic (Dig II, Mid palm)     Dig II Wrist 2.7 ?3.4 18 ?10 Dig II - Wrist 13    R Median - Orthodromic (Dig II, Mid palm)     Dig II Wrist 3.0 ?3.4 20 ?10 Dig II - Wrist 13    L Ulnar - Orthodromic, (Dig V, Mid palm)     Dig V Wrist 2.5 ?3.1 9 ?5 Dig V - Wrist 11    R Ulnar - Orthodromic, (Dig V, Mid palm)     Dig V Wrist 2.7 ?3.1 10 ?5 Dig V - Wrist 65                       F  Wave    Nerve F Lat Ref.   ms ms  L Ulnar - ADM 26.9 ?32.0  R Ulnar - ADM 23.5 ?32.0         EMG Summary Table    Spontaneous MUAP Recruitment  Muscle IA Fib PSW Fasc Other Amp Dur. Poly Pattern  L. Cervical paraspinals (low) Normal None None None _______ Normal Normal Normal Normal  L. Deltoid Normal None None None _______ Normal Normal Normal Normal  L. Biceps brachii  Normal None None None _______ Normal Normal Normal Normal  L. Triceps brachii Normal None None None _______ Normal Normal Normal Normal  L. Pronator teres Normal None None None _______ Normal Normal Normal Normal  L. First dorsal interosseous Normal None None None _______ Normal Normal Normal Normal  L. Extensor digitorum communis Normal None None None _______ Normal Normal Normal Normal  L. Vastus medialis Normal None None None _______ Normal Normal Normal Normal  L. Tibialis anterior Normal None None None _______ Normal Normal Normal Normal  L. Gastrocnemius (Medial head) Normal None None None _______ Normal Normal Normal Normal  L. Extensor hallucis longus Normal None None None _______ Normal Normal Normal Normal  L. Abductor hallucis Normal None None None _______ Normal Normal Normal Normal

## 2023-01-09 ENCOUNTER — Telehealth: Payer: Self-pay | Admitting: Neurology

## 2023-01-09 NOTE — Telephone Encounter (Signed)
Results send to PCP as requested by Frann Rider, NP

## 2023-01-09 NOTE — Telephone Encounter (Signed)
-----   Message from Frann Rider, NP sent at 01/09/2023  7:21 AM EDT ----- Please forward results to PCP who requested these be completed - she can review these results with PCP. Thank you.

## 2023-01-11 ENCOUNTER — Telehealth: Payer: Self-pay | Admitting: *Deleted

## 2023-01-11 NOTE — Telephone Encounter (Signed)
-----   Message from Melvenia Beam, MD sent at 01/11/2023  9:07 AM EDT ----- Vitamin D is low as is vitamon b6. I can send in prescriptions for her if she likes for 3 months and then I recommend over the counter vitamin D 2000 a day and a multivitamin. Let me know please call her thanks

## 2023-01-11 NOTE — Telephone Encounter (Signed)
Spoke to patient gave labwork results . Gave Dr Ferdinand Lango recommendation for Vitamin D and B6  Patient agreed with starting vitamin supplements Pt states please send order to [facility] on [address]  .  Pt asked lab work to be sent to PCP   Forward lab work this am .  Pt  expressed understanding and thanked me for calling

## 2023-01-12 ENCOUNTER — Other Ambulatory Visit: Payer: Self-pay | Admitting: Neurology

## 2023-01-12 DIAGNOSIS — E559 Vitamin D deficiency, unspecified: Secondary | ICD-10-CM

## 2023-01-12 DIAGNOSIS — E531 Pyridoxine deficiency: Secondary | ICD-10-CM

## 2023-01-12 DIAGNOSIS — E539 Vitamin B deficiency, unspecified: Secondary | ICD-10-CM

## 2023-01-12 MED ORDER — FOLIC ACID-VIT B6-VIT B12 2.5-25-1 MG PO TABS: 1.0000 | ORAL_TABLET | Freq: Every day | ORAL | 2 refills | Status: DC

## 2023-01-12 MED ORDER — VITAMIN D (ERGOCALCIFEROL) 1.25 MG (50000 UNIT) PO CAPS: 50000.0000 [IU] | ORAL_CAPSULE | ORAL | 0 refills | Status: DC

## 2023-01-12 NOTE — Telephone Encounter (Signed)
Sent this note to primary care: Dr. Arelia Sneddon, her vitamin D was low and so was her b6. I prescribed Foltx and voitamin D prescriptions and ask that she follows with you about it. Otherwise no idea why this patient has so many neurologic and somatic problems she may need cognitive behavioral therapy thanks

## 2023-01-12 NOTE — Telephone Encounter (Signed)
Prescriptions sent. Let her know when they are finished she may need to get OTC and continue. She can follow with primary care on these levels.

## 2023-01-12 NOTE — Addendum Note (Signed)
Encounter addended by: Melvenia Beam, MD on: 01/12/2023 8:47 AM  Actions taken: Clinical Note Signed

## 2023-01-19 LAB — COMPREHENSIVE METABOLIC PANEL
ALT: 16 IU/L (ref 0–32)
AST: 21 IU/L (ref 0–40)
Albumin/Globulin Ratio: 2 (ref 1.2–2.2)
Albumin: 4.9 g/dL (ref 3.9–4.9)
Alkaline Phosphatase: 69 IU/L (ref 44–121)
BUN/Creatinine Ratio: 12 (ref 9–23)
BUN: 9 mg/dL (ref 6–24)
Bilirubin Total: 0.4 mg/dL (ref 0.0–1.2)
CO2: 23 mmol/L (ref 20–29)
Calcium: 9.3 mg/dL (ref 8.7–10.2)
Chloride: 104 mmol/L (ref 96–106)
Creatinine, Ser: 0.76 mg/dL (ref 0.57–1.00)
Globulin, Total: 2.4 g/dL (ref 1.5–4.5)
Glucose: 90 mg/dL (ref 70–99)
Potassium: 4.1 mmol/L (ref 3.5–5.2)
Sodium: 141 mmol/L (ref 134–144)
Total Protein: 7.3 g/dL (ref 6.0–8.5)
eGFR: 98 mL/min/{1.73_m2} (ref >59)

## 2023-01-19 LAB — CBC WITH DIFFERENTIAL/PLATELET
Basophils Absolute: 0.1 10*3/uL (ref 0.0–0.2)
Basos: 1 %
EOS (ABSOLUTE): 0.1 10*3/uL (ref 0.0–0.4)
Eos: 1 %
Hematocrit: 45.7 % (ref 34.0–46.6)
Hemoglobin: 15.2 g/dL (ref 11.1–15.9)
Immature Grans (Abs): 0 10*3/uL (ref 0.0–0.1)
Immature Granulocytes: 0 %
Lymphocytes Absolute: 1.9 10*3/uL (ref 0.7–3.1)
Lymphs: 19 %
MCH: 30.3 pg (ref 26.6–33.0)
MCHC: 33.3 g/dL (ref 31.5–35.7)
MCV: 91 fL (ref 79–97)
Monocytes Absolute: 0.7 10*3/uL (ref 0.1–0.9)
Monocytes: 7 %
Neutrophils Absolute: 7.2 10*3/uL — ABNORMAL HIGH (ref 1.4–7.0)
Neutrophils: 72 %
Platelets: 325 10*3/uL (ref 150–450)
RBC: 5.02 x10E6/uL (ref 3.77–5.28)
RDW: 12.6 % (ref 11.7–15.4)
WBC: 10.1 10*3/uL (ref 3.4–10.8)

## 2023-01-19 LAB — CK: Total CK: 70 U/L (ref 32–182)

## 2023-01-19 LAB — SEDIMENTATION RATE: Sed Rate: 2 mm/hr (ref 0–32)

## 2023-01-19 LAB — B12 AND FOLATE PANEL
Folate: 10.2 ng/mL (ref >3.0)
Vitamin B-12: 433 pg/mL (ref 232–1245)

## 2023-01-19 LAB — HEPATITIS B SURFACE ANTIBODY,QUALITATIVE: Hep B Surface Ab, Qual: REACTIVE

## 2023-01-19 LAB — VITAMIN B6: Vitamin B6: 2.9 ug/L — ABNORMAL LOW (ref 3.4–65.2)

## 2023-01-19 LAB — MAGNESIUM: Magnesium: 2.3 mg/dL (ref 1.6–2.3)

## 2023-01-19 LAB — MYASTHENIA GRAVIS PROFILE
AChR Binding Ab, Serum: <0.03 nmol/L (ref 0.00–0.24)
AChR-modulating Ab: 4 % (ref 0–45)
Acetylchol Block Ab: 17 % (ref 0–25)
Anti-striation Abs: NEGATIVE

## 2023-01-19 LAB — MUSK ANTIBODIES: MuSK Antibodies: <1 U/mL

## 2023-01-19 LAB — VITAMIN D 25 HYDROXY (VIT D DEFICIENCY, FRACTURES): Vit D, 25-Hydroxy: 23 ng/mL — ABNORMAL LOW (ref 30.0–100.0)

## 2023-01-19 LAB — VITAMIN B1: Thiamine: 120.7 nmol/L (ref 66.5–200.0)

## 2023-01-19 LAB — C-REACTIVE PROTEIN: CRP: 6 mg/L (ref 0–10)

## 2023-01-19 LAB — HEPATITIS B SURFACE ANTIGEN: Hepatitis B Surface Ag: NEGATIVE

## 2023-01-19 LAB — METHYLMALONIC ACID, SERUM: Methylmalonic Acid: 209 nmol/L (ref 0–378)

## 2023-02-10 ENCOUNTER — Encounter: Payer: Self-pay | Admitting: Family Medicine

## 2023-02-10 ENCOUNTER — Ambulatory Visit (INDEPENDENT_AMBULATORY_CARE_PROVIDER_SITE_OTHER): Payer: Medicaid Other | Admitting: Family Medicine

## 2023-02-10 VITALS — BP 98/64 | HR 95 | Temp 98.9°F | Ht 66.0 in | Wt 126.8 lb

## 2023-02-10 DIAGNOSIS — R7309 Other abnormal glucose: Secondary | ICD-10-CM

## 2023-02-10 DIAGNOSIS — F909 Attention-deficit hyperactivity disorder, unspecified type: Secondary | ICD-10-CM | POA: Diagnosis not present

## 2023-02-10 DIAGNOSIS — F419 Anxiety disorder, unspecified: Secondary | ICD-10-CM | POA: Diagnosis not present

## 2023-02-10 DIAGNOSIS — G2581 Restless legs syndrome: Secondary | ICD-10-CM

## 2023-02-10 DIAGNOSIS — H539 Unspecified visual disturbance: Secondary | ICD-10-CM

## 2023-02-10 DIAGNOSIS — E7800 Pure hypercholesterolemia, unspecified: Secondary | ICD-10-CM

## 2023-02-10 DIAGNOSIS — F32A Depression, unspecified: Secondary | ICD-10-CM

## 2023-02-10 MED ORDER — ROPINIROLE HCL 0.25 MG PO TABS: 0.2500 mg | ORAL_TABLET | Freq: Every day | ORAL | 1 refills | Status: DC

## 2023-02-10 NOTE — Progress Notes (Signed)
New Patient Office Visit  Subjective    Patient ID: Kelly Dorsey, female    DOB: 05-Nov-1975  Age: 47 y.o. MRN: [id number]  CC:  Chief Complaint  Patient presents with   Establish Care    HPI Kelly Dorsey presents to establish care Patient previously saw Dr. Jeannetta Nap at [facility].  Anxiety and depression-patient takes half a tablet of BuSpar in the morning.  Has been doing this for the past 5 or 6 years.  Would like to come off of it or find an alternative if possible.  States daughter takes Lexapro and has had good effect.  Patient absolutely will not take Prozac.  Restless legs-patient describes it as a tingling sensation.  Has been going on for 10 years.  Has been taking Klonopin at night for it as needed.  This she has been doing this for the last 10 years.  Has never tried anything besides Klonopin for this.  Migraines-patient has history of severe migraines for which she sees a neurologist and gets over 30 Botox injections every 3 months.  Used to keep her confined to a bed for half a month now she has maybe 9 migraines a month and they are less severe.  ADHD-patient has been taking Adderall for ADHD for the past 5 or 6 years.  Patient states she was told she had "borderline" diabetes and cholesterol a few years ago and was never started on any medications for this.  Never rechecked.  Patient has a history of benign masses in both left and right breast.  These were surgically removed.  She gets mammograms performed at [facility] in [place].  Patient's complaint of blurred vision, was recently had a optometrist office but was told she would need a referral.  This was [facility] [place].  PMH: Migraines, restless leg, anxiety/depression, ADHD  PSH: breast, appendectomy.    Tobacco use: 1/2 ppd.  1pack per week at best.   Alcohol use: seldom.   Drug use: none  Marital status: engaged. 3 children.  2 boys and a girl.  28, 24, 17.   Employment:  none Sexual hx: Currently sexually active  Last pap smear - tubal ligation and ablation.  Always abnormal . Referral to gyn.    Screenings:  Colon Cancer: Patient says she had a colonoscopy about 30 years ago.  Has not had one since. Lung Cancer: Not in age range for yearly lung cancer screening. Breast Cancer: Reports regular screenings with mammography. Cervical cancer: Patient would like gynecology referral for Pap smear.  States they have always been "abnormal in the past.    Outpatient Encounter Medications as of 02/10/2023  Medication Sig   amphetamine-dextroamphetamine (ADDERALL) 20 MG tablet Take 20 mg by mouth daily.   botulinum toxin Type A (BOTOX) 200 units injection Provider to inject 155 units into the muscles of the head and neck every 12 weeks. Discard remainder.   busPIRone (BUSPAR) 5 MG tablet Take 5 mg by mouth daily.   cyclobenzaprine (FLEXERIL) 10 MG tablet Take 10 mg by mouth 3 (three) times daily as needed.   Folic Acid-Vit B6-Vit B12 (FOLBEE) 2.5-25-1 MG TABS tablet Take 1 tablet by mouth daily.   Multiple Vitamin (MULTIVITAMIN PO) Take by mouth.   predniSONE (STERAPRED UNI-PAK 21 TAB) 5 MG (21) TBPK tablet Take by mouth as directed.   rizatriptan (MAXALT-MLT) 10 MG disintegrating tablet Take 1 tablet (10 mg total) by mouth as needed for migraine. May repeat in 2 hours  if needed   rOPINIRole (REQUIP) 0.25 MG tablet Take 1 tablet (0.25 mg total) by mouth at bedtime.   Vitamin D, Ergocalciferol, (DRISDOL) 1.25 MG (50000 UNIT) CAPS capsule Take 1 capsule (50,000 Units total) by mouth every 7 (seven) days.   [DISCONTINUED] clonazePAM (KLONOPIN) 0.5 MG tablet Take 0.5-1.5 mg by mouth at bedtime.   No facility-administered encounter medications on file as of 02/10/2023.    Past Medical History:  Diagnosis Date   Anxiety    Complication of anesthesia    AFTER BTL IN 2006, BP BECAME VERY ELEVATED-WITH EPIDURAL DURING DELIVERY, PT STARTED VOMITING AN SHAKING AND EPIDURAL  HAD TO BE STOPPED-PT STATES WITH WISDOM TEETH SHE STOPPED BREATHING AND WAS SENT FOR SLEEP STUDY WHICH WAS NEGATIVE   Depression    GERD (gastroesophageal reflux disease)    OCC- NO MEDS   Migraine    MIGRAINES   Mood swings    Thyroid nodule     Past Surgical History:  Procedure Laterality Date   APPENDECTOMY     BREAST BIOPSY Left 09/18/2017   [place] core 10:00 FIBROEPITHELIAL LESION   BREAST BIOPSY Right 09/04/2019   affirm bx of distortion,  x marker, radial scar   BREAST EXCISIONAL BIOPSY Left 10/06/2017   lumpectomy with NP- fibroadeoma   BREAST LUMPECTOMY Right 10/03/2019   radial scar   BREAST LUMPECTOMY WITH NEEDLE LOCALIZATION Left 10/06/2017   Procedure: BREAST LUMPECTOMY WITH NEEDLE LOCALIZATION;  Surgeon: Nadeen Landau, MD;  Location: [facility] ORS;  Service: General;  Laterality: Left;   COLONOSCOPY     DILITATION & CURRETTAGE/HYSTROSCOPY WITH NOVASURE ABLATION N/A 07/17/2015   Procedure: DILATATION & CURETTAGE/HYSTEROSCOPY WITH NOVASURE ABLATION;  Surgeon: Magnolia Bing, MD;  Location: [facility] ORS;  Service: Gynecology;  Laterality: N/A;   EYE SURGERY     lasik   LAPAROSCOPY ABDOMEN DIAGNOSTIC     LASIK     PARTIAL MASTECTOMY WITH NEEDLE LOCALIZATION Right 10/03/2019   Procedure: PARTIAL MASTECTOMY WITH NEEDLE LOCALIZATION;  Surgeon: Sung Amabile, DO;  Location: [facility] ORS;  Service: General;  Laterality: Right;   TUBAL LIGATION     WISDOM TOOTH EXTRACTION      Family History  Problem Relation Age of Onset   Hypertension Mother    Lung cancer Mother    Hypertension Father    Diabetes Father    Migraines Father    Heart failure Father    Migraines Brother    Breast cancer Maternal Aunt 7   Breast cancer Paternal Aunt 98   Lupus Maternal Grandmother    Arthritis Maternal Grandmother    Migraines Paternal Grandmother    Breast cancer Paternal Grandmother 65       post meno   Migraines Paternal Grandfather    Breast cancer Cousin 44   Neuropathy Neg Hx      Social History   Socioeconomic History   Marital status: Widowed    Spouse name: Not on file   Number of children: 3   Years of education: 10   Highest education level: Not on file  Occupational History   Occupation: None  Tobacco Use   Smoking status: Every Day    Packs/day: 0.25    Years: 22.00    Additional pack years: 0.00    Total pack years: 5.50    Types: Cigarettes   Smokeless tobacco: Never  Vaping Use   Vaping Use: Never used  Substance and Sexual Activity   Alcohol use: No   Drug use: No  Sexual activity: Not on file  Other Topics Concern   Not on file  Social History Narrative   Drinks about 2 cups of caffeine drinks a day    Right handed   Lives at home with her children & fiance'   Social Determinants of Health   Financial Resource Strain: Not on file  Food Insecurity: Not on file  Transportation Needs: Not on file  Physical Activity: Not on file  Stress: Not on file  Social Connections: Not on file  Intimate Partner Violence: Not on file    ROS      Objective    BP 98/64   Pulse 95   Temp 98.9 F (37.2 C) (Oral)   Ht 5\' 6"  (1.676 m)   Wt 126 lb 12 oz (57.5 kg)   SpO2 96%   BMI 20.46 kg/m   Physical Exam General: Alert and oriented.  No acute distress HEENT: PERRLA, EOMI, normal moist oral mucosa CV: Regular rate and rhythm no murmurs Pulmonary: Lungs clear bilaterally GI: Soft, nontender. MSK: Strength equal bilaterally Neuro: Cranial nerves II through XII grossly intact Psych: Pleasant affect.      Assessment & Plan:   Problem List Items Addressed This Visit       Other   Anxiety and depression   Attention deficit hyperactivity disorder (ADHD)   RLS (restless legs syndrome)   Other Visit Diagnoses     Elevated glucose    -  Primary   Relevant Orders   Lipid Profile   HgB A1c   Elevated cholesterol       Relevant Orders   Lipid Profile   Changes in vision       Relevant Orders   Ambulatory referral to  Optometry       Return in about 4 weeks (around 03/10/2023) for anxiety, rls.   Sandre Kitty, MD

## 2023-02-10 NOTE — Patient Instructions (Signed)
It was nice to meet you,   I have prescribed ropinerole for you to take at night for restless leg.    Please take this instead of clonazapam.    Please follow up in one month to discuss anxiety medications and follow up on the ropinerole.    I will refer you to a gynecologist for a pap smear.    Have a great day,   Dr. Constance Goltz

## 2023-02-11 LAB — LIPID PANEL
Chol/HDL Ratio: 2.4 ratio (ref 0.0–4.4)
Cholesterol, Total: 166 mg/dL (ref 100–199)
HDL: 68 mg/dL (ref >39)
LDL Chol Calc (NIH): 82 mg/dL (ref 0–99)
Triglycerides: 87 mg/dL (ref 0–149)
VLDL Cholesterol Cal: 16 mg/dL (ref 5–40)

## 2023-02-11 LAB — HEMOGLOBIN A1C
Est. average glucose Bld gHb Est-mCnc: 117 mg/dL
Hgb A1c MFr Bld: 5.7 % — ABNORMAL HIGH (ref 4.8–5.6)

## 2023-02-17 ENCOUNTER — Encounter: Payer: Self-pay | Admitting: Family Medicine

## 2023-02-18 ENCOUNTER — Other Ambulatory Visit: Payer: Self-pay | Admitting: Neurology

## 2023-02-18 DIAGNOSIS — E559 Vitamin D deficiency, unspecified: Secondary | ICD-10-CM

## 2023-02-20 ENCOUNTER — Other Ambulatory Visit: Payer: Self-pay | Admitting: Family Medicine

## 2023-02-20 MED ORDER — CLONAZEPAM 0.5 MG PO TABS: ORAL_TABLET | ORAL | 0 refills | Status: DC

## 2023-02-20 MED ORDER — AMPHETAMINE-DEXTROAMPHETAMINE 20 MG PO TABS: 20.0000 mg | ORAL_TABLET | Freq: Every day | ORAL | 0 refills | Status: DC

## 2023-02-28 ENCOUNTER — Other Ambulatory Visit (HOSPITAL_COMMUNITY): Payer: Self-pay

## 2023-03-03 ENCOUNTER — Other Ambulatory Visit: Payer: Self-pay

## 2023-03-03 ENCOUNTER — Other Ambulatory Visit (HOSPITAL_COMMUNITY): Payer: Self-pay

## 2023-03-07 ENCOUNTER — Other Ambulatory Visit: Payer: Self-pay

## 2023-03-07 DIAGNOSIS — G2581 Restless legs syndrome: Secondary | ICD-10-CM

## 2023-03-07 MED ORDER — ROPINIROLE HCL 0.25 MG PO TABS: 0.2500 mg | ORAL_TABLET | Freq: Every day | ORAL | 0 refills | Status: DC

## 2023-03-13 ENCOUNTER — Ambulatory Visit (INDEPENDENT_AMBULATORY_CARE_PROVIDER_SITE_OTHER): Payer: Medicaid Other | Admitting: Adult Health

## 2023-03-13 DIAGNOSIS — G43709 Chronic migraine without aura, not intractable, without status migrainosus: Secondary | ICD-10-CM

## 2023-03-13 MED ORDER — ONABOTULINUMTOXINA 200 UNITS IJ SOLR
155.0000 [IU] | Freq: Once | INTRAMUSCULAR | Status: AC
  Administered 2023-03-13: 155 [IU] via INTRAMUSCULAR

## 2023-03-13 NOTE — Progress Notes (Signed)
Botox- 200 units x 1 vial Lot: [id number] Expiration: 03/2025 NDC: 4782-9562-13  Bacteriostatic 0.9% Sodium Chloride- * mL  Lot: [id number] Expiration: 01/23/2024 NDC: 4696-2952-84  Dx: X32.440  S/P Witnessed by Delmer Islam

## 2023-03-13 NOTE — Progress Notes (Signed)
03/13/23: only a few migraines since last injection. Reports that Botox is working well. Here today with her fiance.   Update 12/14/2022 JM: Patient is being seen for Botox injection.  Migraines the same from when she was last seen by Dr. Lucia Gaskins 1 month ago.  Tolerated procedure well today.  Will return in 3 months for repeat injection.   She is scheduled to undergo EMG/NCV on 3/14 as well as completion of lab work, PCP requesting she also have hep B, ESR and CRP completed at that time  BOTOX PROCEDURE NOTE FOR MIGRAINE HEADACHE    Contraindications and precautions discussed with patient(above). Aseptic procedure was observed and patient tolerated procedure. Procedure performed by Butch Penny, NP  The condition has existed for more than 6 months, and pt does not have a diagnosis of ALS, Myasthenia Gravis or Lambert-Eaton Syndrome.  Risks and benefits of injections discussed and pt agrees to proceed with the procedure.  Written consent obtained  These injections are medically necessary.  These injections do not cause sedations or hallucinations which the oral therapies may cause.  Indication/Diagnosis: chronic migraine BOTOX(J0585) injection was performed according to protocol by [employer]. 200 units of BOTOX was dissolved into 4 cc NS.   NDC: [address]-78  Type of toxin: Botox  Botox- 200 units x 1 vial Lot: [id number] Expiration: 03/2025 NDC: 0865-7846-96   Bacteriostatic 0.9% Sodium Chloride- * mL  Lot: [id number] Expiration: 01/23/2024 NDC: 1324-4010-27   Dx: O53.664     Description of procedure:  The patient was placed in a sitting position. The standard protocol was used for Botox as follows, with 5 units of Botox injected at each site:   -Procerus muscle, midline injection  -Corrugator muscle, bilateral injection  -Frontalis muscle, bilateral injection, with 2 sites each side, medial injection was performed in the upper one third of the frontalis muscle, in the region  vertical from the medial inferior edge of the superior orbital rim. The lateral injection was again in the upper one third of the forehead vertically above the lateral limbus of the cornea, 1.5 cm lateral to the medial injection site.  -Temporalis muscle injection, 4 sites, bilaterally. The first injection was 3 cm above the tragus of the ear, second injection site was 1.5 cm to 3 cm up from the first injection site in line with the tragus of the ear. The third injection site was 1.5-3 cm forward between the first 2 injection sites. The fourth injection site was 1.5 cm posterior to the second injection site.  -Occipitalis muscle injection, 3 sites, bilaterally. The first injection was done one half way between the occipital protuberance and the tip of the mastoid process behind the ear. The second injection site was done lateral and superior to the first, 1 fingerbreadth from the first injection. The third injection site was 1 fingerbreadth superiorly and medially from the first injection site.  -Cervical paraspinal muscle injection, 2 sites, bilateral knee first injection site was 1 cm from the midline of the cervical spine, 3 cm inferior to the lower border of the occipital protuberance. The second injection site was 1.5 cm superiorly and laterally to the first injection site.  -Trapezius muscle injection was performed at 3 sites, bilaterally. The first injection site was in the upper trapezius muscle halfway between the inflection point of the neck, and the acromion. The second injection site was one half way between the acromion and the first injection site. The third injection was done between the first injection site and  the inflection point of the neck.   Will return for repeat injection in 3 months.   A 200 unit sof Botox was used, 155 units were injected, the rest of the Botox was wasted. The patient tolerated the procedure well, there were no complications of the above procedure.  Butch Penny, MSN, NP-C 03/13/2023, 3:00 PM [facility] [address], [address] [place], [place] [address] [phone]

## 2023-03-24 ENCOUNTER — Encounter: Payer: Self-pay | Admitting: Internal Medicine

## 2023-03-24 ENCOUNTER — Ambulatory Visit: Payer: Medicaid Other | Admitting: Internal Medicine

## 2023-03-31 ENCOUNTER — Other Ambulatory Visit: Payer: Self-pay | Admitting: Family Medicine

## 2023-04-05 MED ORDER — AMPHETAMINE-DEXTROAMPHETAMINE 20 MG PO TABS: 20.0000 mg | ORAL_TABLET | Freq: Every day | ORAL | 0 refills | Status: DC

## 2023-04-05 MED ORDER — CYCLOBENZAPRINE HCL 10 MG PO TABS: 10.0000 mg | ORAL_TABLET | Freq: Three times a day (TID) | ORAL | 0 refills | Status: DC | PRN

## 2023-04-05 MED ORDER — CLONAZEPAM 0.5 MG PO TABS: ORAL_TABLET | ORAL | 0 refills | Status: DC

## 2023-04-24 ENCOUNTER — Ambulatory Visit: Payer: Medicaid Other | Attending: Internal Medicine | Admitting: Internal Medicine

## 2023-04-24 VITALS — BP 126/82 | HR 110 | Ht 66.0 in | Wt 119.0 lb

## 2023-04-24 DIAGNOSIS — R5383 Other fatigue: Secondary | ICD-10-CM | POA: Diagnosis not present

## 2023-04-24 DIAGNOSIS — R531 Weakness: Secondary | ICD-10-CM | POA: Diagnosis not present

## 2023-04-24 NOTE — Progress Notes (Signed)
Cardiology Office Note:    Date:  04/24/2023   ID:  Kelly Dorsey, DOB 10/03/1976, MRN [id number]  PCP:  Sandre Kitty, MD   [facility] Providers Cardiologist:  None     Referring MD: Lance Bosch, NP   No chief complaint on file. Fatigue/weakness  History of Present Illness:    Kelly Dorsey is a 47 y.o. female with a hx of anxiety, smoker ( working on cutting back), referral for fatigue/weakness. She has chronic fatigue. She feels tingling in her arms BL. She feels weak. Her balance is off. This has been since 2019.  She notes shooting CP that goes to her neck. She notes she gets stuck and then it releases, this has been for years.  No BP, SOB. No hx of syncope. No signs of CHF.  Past Medical History:  Diagnosis Date   Anxiety    Complication of anesthesia    AFTER BTL IN 2006, BP BECAME VERY ELEVATED-WITH EPIDURAL DURING DELIVERY, PT STARTED VOMITING AN SHAKING AND EPIDURAL HAD TO BE STOPPED-PT STATES WITH WISDOM TEETH SHE STOPPED BREATHING AND WAS SENT FOR SLEEP STUDY WHICH WAS NEGATIVE   Depression    Fatigue    GERD (gastroesophageal reflux disease)    OCC- NO MEDS   Migraine    MIGRAINES   Mood swings    Neuropathy    Sinusitis    Thyroid nodule    Weakness     Past Surgical History:  Procedure Laterality Date   APPENDECTOMY     BREAST BIOPSY Left 09/18/2017   [place] core 10:00 FIBROEPITHELIAL LESION   BREAST BIOPSY Right 09/04/2019   affirm bx of distortion,  x marker, radial scar   BREAST EXCISIONAL BIOPSY Left 10/06/2017   lumpectomy with NP- fibroadeoma   BREAST LUMPECTOMY Right 10/03/2019   radial scar   BREAST LUMPECTOMY WITH NEEDLE LOCALIZATION Left 10/06/2017   Procedure: BREAST LUMPECTOMY WITH NEEDLE LOCALIZATION;  Surgeon: Nadeen Landau, MD;  Location: [facility] ORS;  Service: General;  Laterality: Left;   COLONOSCOPY     DILITATION & CURRETTAGE/HYSTROSCOPY WITH NOVASURE ABLATION N/A 07/17/2015   Procedure: DILATATION &  CURETTAGE/HYSTEROSCOPY WITH NOVASURE ABLATION;  Surgeon:  Bing, MD;  Location: [facility] ORS;  Service: Gynecology;  Laterality: N/A;   EYE SURGERY     lasik   LAPAROSCOPY ABDOMEN DIAGNOSTIC     LASIK     PARTIAL MASTECTOMY WITH NEEDLE LOCALIZATION Right 10/03/2019   Procedure: PARTIAL MASTECTOMY WITH NEEDLE LOCALIZATION;  Surgeon: Sung Amabile, DO;  Location: [facility] OR[facility];  Service: General;  Laterality: Right;   TUBAL LIGATION     WISDOM TOOTH EXTRACTION      Current Medications: Current Meds  Medication Sig   amphetamine-dextroamphetamine (ADDERALL) 20 MG tablet Take 1 tablet (20 mg total) by mouth daily.   botulinum toxin Type A (BOTOX) 200 units injection Provider to inject 155 units into the muscles of the head and neck every 12 weeks. Discard remainder.   clonazePAM (KLONOPIN) 0.5 MG tablet 1-3 tablets as needed at bed time for sleep and RLS   cyclobenzaprine (FLEXERIL) 10 MG tablet Take 1 tablet (10 mg total) by mouth 3 (three) times daily as needed.   Multiple Vitamin (MULTIVITAMIN PO) Take by mouth.   predniSONE (STERAPRED UNI-PAK 21 TAB) 5 MG (21) TBPK tablet Take by mouth as needed.   rizatriptan (MAXALT-MLT) 10 MG disintegrating tablet Take 1 tablet (10 mg total) by mouth as needed for migraine. May repeat in 2 hours if  needed     Allergies:   Other   Social History   Socioeconomic History   Marital status: Widowed    Spouse name: Not on file   Number of children: 3   Years of education: 10   Highest education level: Not on file  Occupational History   Occupation: None  Tobacco Use   Smoking status: Every Day    Packs/day: 0.25    Years: 22.00    Additional pack years: 0.00    Total pack years: 5.50    Types: Cigarettes   Smokeless tobacco: Never  Vaping Use   Vaping Use: Never used  Substance and Sexual Activity   Alcohol use: No   Drug use: No   Sexual activity: Not on file  Other Topics Concern   Not on file  Social History Narrative   Drinks about  2 cups of caffeine drinks a day    Right handed   Lives at home with her children & fiance'   Social Determinants of Health   Financial Resource Strain: Not on file  Food Insecurity: Not on file  Transportation Needs: Not on file  Physical Activity: Not on file  Stress: Not on file  Social Connections: Not on file     Family History: The patient's family history includes Arthritis in her maternal grandmother; Breast cancer (age of onset: 3) in her cousin; Breast cancer (age of onset: 68) in her maternal aunt; Breast cancer (age of onset: 50) in her paternal aunt; Breast cancer (age of onset: 33) in her paternal grandmother; Diabetes in her father; Heart failure in her father; Hypertension in her father and mother; Lung cancer in her mother; Lupus in her maternal grandmother; Migraines in her brother, father, paternal grandfather, and paternal grandmother. There is no history of Neuropathy. Father- MI at 59  ROS:   Please see the history of present illness.     All other systems reviewed and are negative.  EKGs/Labs/Other Studies Reviewed:    The following studies were reviewed today:  EKG Interpretation Date/Time:  Monday April 24 2023 13:44:06 EDT Ventricular Rate:  110 PR Interval:  134 QRS Duration:  72 QT Interval:  348 QTC Calculation: 470 R Axis:   49  Text Interpretation: Sinus tachycardia Possible Left atrial enlargement Nonspecific T wave abnormality When compared with ECG of 08-Jun-2022 12:00, Vent. rate has increased BY  38 BPM Criteria for Septal infarct are no longer Present Nonspecific T wave abnormality now evident in Inferior leads Nonspecific T wave abnormality now evident in Lateral leads Confirmed by Carolan Clines [phone]) on 04/24/2023 1:48:54 PM  EKG Interpretation Date/Time:  Monday April 24 2023 13:44:06 EDT Ventricular Rate:  110 PR Interval:  134 QRS Duration:  72 QT Interval:  348 QTC Calculation: 470 R Axis:   49  Text Interpretation: Sinus  tachycardia Possible Left atrial enlargement Nonspecific T wave abnormality When compared with ECG of 08-Jun-2022 12:00, Vent. rate has increased BY  38 BPM Criteria for Septal infarct are no longer Present Nonspecific T wave abnormality now evident in Inferior leads Nonspecific T wave abnormality now evident in Lateral leads Confirmed by Carolan Clines [phone]) on 04/24/2023 1:48:54 PM    Recent Labs: 01/05/2023: ALT 16; BUN 9; Creatinine, Ser 0.76; Hemoglobin 15.2; Magnesium 2.3; Platelets 325; Potassium 4.1; Sodium 141   Recent Lipid Panel    Component Value Date/Time   CHOL 166 02/10/2023 1100   TRIG 87 02/10/2023 1100   HDL 68 02/10/2023 1100  CHOLHDL 2.4 02/10/2023 1100   LDLCALC 82 02/10/2023 1100     Risk Assessment/Calculations:     Physical Exam:    VS:   Vitals:   04/24/23 1348  BP: 126/82  Pulse: (!) 110  SpO2: 98%     Wt Readings from Last 3 Encounters:  04/24/23 119 lb (54 kg)  02/10/23 126 lb 12 oz (57.5 kg)  01/05/23 126 lb (57.2 kg)     GEN:  Well nourished, well developed in no acute distress HEENT: Normal NECK: No JVD; No carotid bruits CARDIAC: RRR, no murmurs, rubs, gallops RESPIRATORY:  Clear to auscultation without rales, wheezing or rhonchi  ABDOMEN: Soft, non-tender, non-distended MUSCULOSKELETAL:  No edema; No deformity  SKIN: Warm and dry NEUROLOGIC:  Alert and oriented x 3 PSYCHIATRIC:  Normal affect   ASSESSMENT:   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 Prior trauma may have contributed. We discussed this  PLAN:    In order of problems listed above:  No further cardiac w/u  Medication Adjustments/Labs and Tests Ordered: Current medicines are reviewed at length with the patient today.  Concerns regarding medicines are outlined above.  Orders Placed This Encounter  Procedures   EKG 12-Lead   No  orders of the defined types were placed in this encounter.   There are no Patient Instructions on file for this visit.   Signed, Maisie Fus, MD  04/24/2023 1:58 PM    [facility]

## 2023-04-24 NOTE — Patient Instructions (Signed)
Medication Instructions:  Your physician recommends that you continue on your current medications as directed. Please refer to the Current Medication list given to you today.  *If you need a refill on your cardiac medications before your next appointment, please call your pharmacy*   Lab Work: None   Testing/Procedures: None   Follow-Up: At [facility], you and your health needs are our priority.  As part of our continuing mission to provide you with exceptional heart care, we have created designated Provider Care Teams.  These Care Teams include your primary Cardiologist (physician) and Advanced Practice Providers (APPs -  Physician Assistants and Nurse Practitioners) who all work together to provide you with the care you need, when you need it.   Your next appointment:    As needed  Provider:   Branch, Mary E, MD    

## 2023-05-08 NOTE — Telephone Encounter (Signed)
Initiated new auth via CMM, status is pending. (Key: [id number])

## 2023-05-08 NOTE — Telephone Encounter (Signed)
Received fax of approval: ref # [id number] (05/08/23-11/04/23).

## 2023-05-10 ENCOUNTER — Other Ambulatory Visit: Payer: Self-pay | Admitting: Family Medicine

## 2023-05-15 MED ORDER — AMPHETAMINE-DEXTROAMPHETAMINE 20 MG PO TABS: 20.0000 mg | ORAL_TABLET | Freq: Every day | ORAL | 0 refills | Status: DC

## 2023-05-24 ENCOUNTER — Other Ambulatory Visit: Payer: Self-pay

## 2023-05-29 ENCOUNTER — Other Ambulatory Visit: Payer: Self-pay | Admitting: Neurology

## 2023-05-29 ENCOUNTER — Other Ambulatory Visit (HOSPITAL_COMMUNITY): Payer: Self-pay

## 2023-05-30 ENCOUNTER — Other Ambulatory Visit (HOSPITAL_COMMUNITY): Payer: Self-pay

## 2023-05-30 ENCOUNTER — Other Ambulatory Visit: Payer: Self-pay

## 2023-05-30 MED ORDER — BOTOX 200 UNITS IJ SOLR
INTRAMUSCULAR | 3 refills | Status: DC
  Filled 2023-05-30: qty 1, 84d supply, fill #0
  Filled 2023-08-23: qty 1, 84d supply, fill #1
  Filled 2023-10-28 – 2023-10-30 (×2): qty 1, 84d supply, fill #2

## 2023-05-31 ENCOUNTER — Other Ambulatory Visit (HOSPITAL_COMMUNITY): Payer: Self-pay

## 2023-06-05 ENCOUNTER — Ambulatory Visit: Payer: Medicaid Other | Admitting: Adult Health

## 2023-06-05 ENCOUNTER — Encounter: Payer: Self-pay | Admitting: Adult Health

## 2023-06-15 ENCOUNTER — Other Ambulatory Visit: Payer: Self-pay | Admitting: Family Medicine

## 2023-06-15 ENCOUNTER — Encounter: Payer: Self-pay | Admitting: Family Medicine

## 2023-06-15 MED ORDER — CYCLOBENZAPRINE HCL 10 MG PO TABS: 10.0000 mg | ORAL_TABLET | Freq: Three times a day (TID) | ORAL | 0 refills | Status: DC | PRN

## 2023-06-15 MED ORDER — AMPHETAMINE-DEXTROAMPHETAMINE 20 MG PO TABS: 20.0000 mg | ORAL_TABLET | Freq: Every day | ORAL | 0 refills | Status: DC

## 2023-06-16 NOTE — Telephone Encounter (Signed)
Please call the patient to schedule an appointment with me and send in a records request to dr. Jeannetta Nap if we have not already.

## 2023-06-20 ENCOUNTER — Encounter: Payer: Self-pay | Admitting: Family Medicine

## 2023-06-20 ENCOUNTER — Ambulatory Visit: Payer: Medicaid Other | Admitting: Family Medicine

## 2023-06-20 VITALS — BP 121/83 | HR 83 | Ht 66.0 in | Wt 122.0 lb

## 2023-06-20 DIAGNOSIS — R198 Other specified symptoms and signs involving the digestive system and abdomen: Secondary | ICD-10-CM

## 2023-06-20 DIAGNOSIS — F32A Depression, unspecified: Secondary | ICD-10-CM | POA: Diagnosis not present

## 2023-06-20 DIAGNOSIS — F419 Anxiety disorder, unspecified: Secondary | ICD-10-CM

## 2023-06-20 DIAGNOSIS — R5382 Chronic fatigue, unspecified: Secondary | ICD-10-CM

## 2023-06-20 MED ORDER — HYDROXYZINE PAMOATE 25 MG PO CAPS: 25.0000 mg | ORAL_CAPSULE | Freq: Three times a day (TID) | ORAL | 0 refills | Status: DC | PRN

## 2023-06-20 NOTE — Patient Instructions (Signed)
It was nice to see you today,  We addressed the following topics today: -For your fatigue/depression I have sent in a referral to the psychiatrist.  I have also sent in a prescription for Atarax to use as needed.  Try to avoid using it at night when you take other sedating medications. - For your constipation I would like you to try taking over-the-counter Metamucil daily.  Your goal should be to have a bowel movement every 1 to 2 days.  Start out by only taking it once a day.  You can increase to 2 or 3 times a day if you tolerate it. - Follow-up with me in 1 month.  Have a great day,  Frederic Jericho, MD

## 2023-06-20 NOTE — Assessment & Plan Note (Signed)
Patient has had this issue ongoing for several years and reportedly had a colonoscopy and endoscopy at was unrevealing.  Has several days to weeks of no bowel movements followed by diarrhea.  Also with normal bowel movements occasionally.  Advised patient to use Metamucil daily with a goal of having a bowel movement every 1 to 2 days.  Discussed titrating up to 2 or 3 doses per day.  Will follow-up in 1 month to see if patient has made any progress with this.

## 2023-06-20 NOTE — Assessment & Plan Note (Signed)
Patient has had years of chronic fatigue.  Believe this is mood related, likely a component of her depression.  She has been worked up for sleep apnea and other causes of fatigue, all of which have been negative.  Will refer to psychiatry.

## 2023-06-20 NOTE — Progress Notes (Unsigned)
Established Patient Office Visit  Subjective   Patient ID: Kelly Dorsey, female    DOB: 1975/12/14  Age: 47 y.o. MRN: [id number]  Chief Complaint  Patient presents with   Medical Management of Chronic Issues   Nausea    HPI  Patient has had issues with alternating constipation/diarrhea going back several years.  Has seen a gastroenterologist in the past.  Has been more than 10 years since the last time she saw them she believes.  Has episodes of several days or 1 to 2 weeks of no bowel movement and then will have multiple episodes of diarrhea usually last less than 1 day.  Will have some normal stools as well during this time.  No blood in the stool.  Also with excessive gassiness.  Occasionally has epigastric pain.  Fatigue/depression-patient states that she is tired all the time.  Goes to bed tired, wakes up tired.  Sometimes takes naps during the day.  Usually does not have issues falling asleep but will wake multiple times during the night.  Will go to his bed between 9PM and midnight and then wake up between 7 AM and 10 AM.  She has been evaluated for sleep apnea in the past this was negative.  She has had workups of other causes of fatigue that were negative.  Patient has previously tried several medications and has gone through therapy for 8 years regarding her psychiatric issues including depression, anxiety, PTSD.  Feels like the medications made her "numb" when she took them.  Has been several years since she took any medications for depression or since she has had therapy.  Is open to seeing a psychiatrist.  Has tried Atarax in the past for anxiety.  Felt like that helped.  She would rather take something as needed then something preventative.  Migraines-patient feels like her migraines are the same.  Did not get her last round of Botox injection.   The 10-year ASCVD risk score (Arnett DK, et al., 2019) is: 1.4%  Health Maintenance Due  Topic Date Due   HIV Screening  Never  done   Hepatitis C Screening  Never done   DTaP/Tdap/Td (1 - Tdap) Never done   PAP SMEAR-Modifier  Never done   Colonoscopy  Never done   COVID-19 Vaccine (1 - 2023-24 season) Never done   INFLUENZA VACCINE  05/25/2023      Objective:     BP 121/83   Pulse 83   Ht 5\' 6"  (1.676 m)   Wt 122 lb (55.3 kg)   SpO2 99%   BMI 19.69 kg/m  {Vitals History (Optional):23777}  Physical Exam General: Alert and oriented.  Accompanied by boyfriend. 's CV: Regular rate and rhythm GI: Soft, nontender.  Normal bowel sounds. Psych: Blunted affect.   No results found for any visits on 06/20/23.      Assessment & Plan:   Anxiety and depression Assessment & Plan: Patient anxiety and depression appears to be difficult to treat in the past.  Is currently taking Klonopin as needed at night to help with her sleep.  Not taking any other psychiatric medications.  Will place referral to psychiatry.  Will give Atarax to use as needed for anxiety.  Advised patient not to take this at night when taking her other sedating medications.  Orders: -     Ambulatory referral to Psychiatry  Chronic fatigue Assessment & Plan: Patient has had years of chronic fatigue.  Believe this is mood related, likely a component  of her depression.  She has been worked up for sleep apnea and other causes of fatigue, all of which have been negative.  Will refer to psychiatry.   Alternating constipation and diarrhea Assessment & Plan: Patient has had this issue ongoing for several years and reportedly had a colonoscopy and endoscopy at was unrevealing.  Has several days to weeks of no bowel movements followed by diarrhea.  Also with normal bowel movements occasionally.  Advised patient to use Metamucil daily with a goal of having a bowel movement every 1 to 2 days.  Discussed titrating up to 2 or 3 doses per day.  Will follow-up in 1 month to see if patient has made any progress with this.   Other orders -      hydrOXYzine Pamoate; Take 1 capsule (25 mg total) by mouth every 8 (eight) hours as needed.  Dispense: 30 capsule; Refill: 0     Return in about 4 weeks (around 07/18/2023) for Mood, GI.    Sandre Kitty, MD

## 2023-06-20 NOTE — Assessment & Plan Note (Signed)
Patient anxiety and depression appears to be difficult to treat in the past.  Is currently taking Klonopin as needed at night to help with her sleep.  Not taking any other psychiatric medications.  Will place referral to psychiatry.  Will give Atarax to use as needed for anxiety.  Advised patient not to take this at night when taking her other sedating medications.

## 2023-07-06 ENCOUNTER — Ambulatory Visit (INDEPENDENT_AMBULATORY_CARE_PROVIDER_SITE_OTHER): Payer: Medicaid Other | Admitting: Physician Assistant

## 2023-07-06 ENCOUNTER — Encounter (HOSPITAL_COMMUNITY): Payer: Self-pay | Admitting: Physician Assistant

## 2023-07-06 VITALS — BP 128/97 | HR 92 | Temp 98.0°F | Ht 66.0 in | Wt 123.0 lb

## 2023-07-06 DIAGNOSIS — F411 Generalized anxiety disorder: Secondary | ICD-10-CM | POA: Diagnosis not present

## 2023-07-06 DIAGNOSIS — F4310 Post-traumatic stress disorder, unspecified: Secondary | ICD-10-CM | POA: Insufficient documentation

## 2023-07-06 DIAGNOSIS — F332 Major depressive disorder, recurrent severe without psychotic features: Secondary | ICD-10-CM | POA: Diagnosis not present

## 2023-07-06 MED ORDER — MIRTAZAPINE 7.5 MG PO TABS
7.5000 mg | ORAL_TABLET | Freq: Every day | ORAL | 1 refills | Status: DC
Start: 2023-07-06 — End: 2023-07-28
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
## 2023-07-12 ENCOUNTER — Telehealth: Payer: Self-pay | Admitting: *Deleted

## 2023-07-12 NOTE — Telephone Encounter (Signed)
Pt LVM rquesting to have a tb test. LM for her to call office to schedule.

## 2023-07-18 ENCOUNTER — Other Ambulatory Visit: Payer: Self-pay | Admitting: Family Medicine

## 2023-07-18 ENCOUNTER — Other Ambulatory Visit: Payer: Self-pay | Admitting: Neurology

## 2023-07-18 DIAGNOSIS — E559 Vitamin D deficiency, unspecified: Secondary | ICD-10-CM

## 2023-07-18 MED ORDER — AMPHETAMINE-DEXTROAMPHETAMINE 20 MG PO TABS: 20.0000 mg | ORAL_TABLET | Freq: Every day | ORAL | 0 refills | Status: DC

## 2023-07-18 MED ORDER — HYDROXYZINE PAMOATE 25 MG PO CAPS: 25.0000 mg | ORAL_CAPSULE | Freq: Three times a day (TID) | ORAL | 0 refills | Status: DC | PRN

## 2023-07-19 MED ORDER — VITAMIN D (ERGOCALCIFEROL) 1.25 MG (50000 UNIT) PO CAPS
50000.0000 [IU] | ORAL_CAPSULE | ORAL | 0 refills | Status: DC
Start: 2023-07-19 — End: 2023-12-05

## 2023-07-24 ENCOUNTER — Encounter: Payer: Self-pay | Admitting: Family Medicine

## 2023-07-24 ENCOUNTER — Ambulatory Visit (INDEPENDENT_AMBULATORY_CARE_PROVIDER_SITE_OTHER): Payer: Medicaid Other | Admitting: Family Medicine

## 2023-07-24 VITALS — BP 127/83 | HR 96 | Ht 66.0 in | Wt 127.8 lb

## 2023-07-24 DIAGNOSIS — R198 Other specified symptoms and signs involving the digestive system and abdomen: Secondary | ICD-10-CM

## 2023-07-24 DIAGNOSIS — Z124 Encounter for screening for malignant neoplasm of cervix: Secondary | ICD-10-CM | POA: Diagnosis not present

## 2023-07-24 DIAGNOSIS — F332 Major depressive disorder, recurrent severe without psychotic features: Secondary | ICD-10-CM

## 2023-07-24 NOTE — Patient Instructions (Signed)
It was nice to see you today,  We addressed the following topics today: -I will send in the referral to the gastroenterologist.  I will send in a referral to the gynecologist. - I would like to see you back in November after you have had a chance to see the psychiatrist and therapist again - Continue to try and wean off of the Klonopin by continuing to decrease the dose from 1 tablet to half a tablet at night and then you can start taking half a tablet every other day or stop completely after going down to half tablet.  Have a great day,  Frederic Jericho, MD

## 2023-07-24 NOTE — Progress Notes (Signed)
   Established Patient Office Visit  Subjective   Patient ID: Kelly Dorsey, female    DOB: 12-02-1975  Age: 47 y.o. MRN: [id number]  Chief Complaint  Patient presents with   Medical Management of Chronic Issues    HPI  GI -patient still having alternating constipation and diarrhea even after taking Metamucil.  Only thing she has noticed is that she is more "gassy".  Would like a referral to a gastroenterologist.  Does not remember who performed her initial colonoscopy 10 years ago, thinks it may have been [facility].  Office Was located on [address].  Depression/anxiety-patient taking Atarax as needed.  Taking it every other day as needed.  Since starting the Atarax and adding mirtazapine, patient has used her Klonopin less.  Is now down to 1 tablet a day of the Klonopin taken prior to bedtime.  We discussed continuing to wean down and eventually off.  Patient has noticed slight improvements in her mood.  Feels like she is "less manic" than previously.  Fatigue is mildly improved.   The 10-year ASCVD risk score (Arnett DK, et al., 2019) is: 1.5%  Health Maintenance Due  Topic Date Due   HIV Screening  Never done   Hepatitis C Screening  Never done   DTaP/Tdap/Td (1 - Tdap) Never done   Cervical Cancer Screening (HPV/Pap Cotest)  Never done   Colonoscopy  Never done   INFLUENZA VACCINE  05/25/2023   COVID-19 Vaccine (1 - 2023-24 season) Never done      Objective:     BP 127/83   Pulse 96   Ht 5\' 6"  (1.676 m)   Wt 127 lb 12.8 oz (58 kg)   SpO2 99%   BMI 20.63 kg/m    Physical Exam General: Alert, oriented Pulmonary: No history of distress Psych: Pleasant affect, spontaneous speech, good eye contact.  Less anxious appearing than previous visit.   No results found for any visits on 07/24/23.      Assessment & Plan:   Alternating constipation and diarrhea Assessment & Plan: Metamucil did not help with her symptoms.  Still has alternating  constipation/diarrhea and now with more gassiness/bloating.  Discussed referral to gastroenterology with patient.  She does not member who she previously saw 10 years ago.  Orders: -     Ambulatory referral to Gastroenterology  Cervical cancer screening -     Ambulatory referral to Gynecology  Severe episode of recurrent major depressive disorder, without psychotic features [facility]) Assessment & Plan: Has seen the psychiatrist for initial visit.  Has another follow-up appointment scheduled and a therapy appointment scheduled in October.  Is taking mirtazapine.  Has weaned herself down on the Klonopin from 3 tablets to 1 tablet at night with a goal of titrating off completely.  Feels like it has been an Atarax or helping with anxiety.  Fatigue is still an issue.      Return in about 6 weeks (around 09/04/2023) for mood hide she is ready.    Sandre Kitty, MD

## 2023-07-24 NOTE — Assessment & Plan Note (Signed)
Has seen the psychiatrist for initial visit.  Has another follow-up appointment scheduled and a therapy appointment scheduled in October.  Is taking mirtazapine.  Has weaned herself down on the Klonopin from 3 tablets to 1 tablet at night with a goal of titrating off completely.  Feels like it has been an Atarax or helping with anxiety.  Fatigue is still an issue.

## 2023-07-24 NOTE — Assessment & Plan Note (Signed)
Metamucil did not help with her symptoms.  Still has alternating constipation/diarrhea and now with more gassiness/bloating.  Discussed referral to gastroenterology with patient.  She does not member who she previously saw 10 years ago.

## 2023-07-28 ENCOUNTER — Other Ambulatory Visit (HOSPITAL_COMMUNITY): Payer: Self-pay | Admitting: Physician Assistant

## 2023-07-28 DIAGNOSIS — F4310 Post-traumatic stress disorder, unspecified: Secondary | ICD-10-CM

## 2023-07-28 DIAGNOSIS — F411 Generalized anxiety disorder: Secondary | ICD-10-CM

## 2023-07-28 DIAGNOSIS — F332 Major depressive disorder, recurrent severe without psychotic features: Secondary | ICD-10-CM

## 2023-08-03 ENCOUNTER — Ambulatory Visit (INDEPENDENT_AMBULATORY_CARE_PROVIDER_SITE_OTHER): Payer: Medicaid Other | Admitting: Licensed Clinical Social Worker

## 2023-08-03 ENCOUNTER — Encounter (HOSPITAL_COMMUNITY): Payer: Self-pay | Admitting: Licensed Clinical Social Worker

## 2023-08-03 DIAGNOSIS — F411 Generalized anxiety disorder: Secondary | ICD-10-CM | POA: Diagnosis not present

## 2023-08-03 DIAGNOSIS — F332 Major depressive disorder, recurrent severe without psychotic features: Secondary | ICD-10-CM | POA: Diagnosis not present

## 2023-08-03 DIAGNOSIS — F4310 Post-traumatic stress disorder, unspecified: Secondary | ICD-10-CM
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
## 2023-08-17 ENCOUNTER — Telehealth (INDEPENDENT_AMBULATORY_CARE_PROVIDER_SITE_OTHER): Payer: Medicaid Other | Admitting: Physician Assistant

## 2023-08-17 ENCOUNTER — Other Ambulatory Visit: Payer: Self-pay

## 2023-08-17 DIAGNOSIS — F4310 Post-traumatic stress disorder, unspecified: Secondary | ICD-10-CM | POA: Diagnosis not present

## 2023-08-17 DIAGNOSIS — F332 Major depressive disorder, recurrent severe without psychotic features: Secondary | ICD-10-CM

## 2023-08-17 DIAGNOSIS — F411 Generalized anxiety disorder: Secondary | ICD-10-CM | POA: Diagnosis not present

## 2023-08-18 ENCOUNTER — Encounter (HOSPITAL_COMMUNITY): Payer: Self-pay | Admitting: Physician Assistant

## 2023-08-18 MED ORDER — MIRTAZAPINE 15 MG PO TABS
15.0000 mg | ORAL_TABLET | Freq: Every day | ORAL | 1 refills | Status: DC
Start: 2023-08-18 — End: 2023-09-15

## 2023-08-18 NOTE — Progress Notes (Unsigned)
BH MD/PA/NP OP Progress Note  08/18/2023 6:35 PM Kelly Dorsey  MRN:  [id number]  Chief Complaint:  Chief Complaint  Patient presents with   Follow-up   Medication Management   HPI: ***  Kelly Dorsey  Visit Diagnosis:    ICD-10-CM   1. Severe episode of recurrent major depressive disorder, without psychotic features (HCC)  F33.2 mirtazapine (REMERON) 15 MG tablet    2. PTSD (post-traumatic stress disorder)  F43.10 mirtazapine (REMERON) 15 MG tablet    3. Generalized anxiety disorder  F41.1 mirtazapine (REMERON) 15 MG tablet      Past Psychiatric History:  Patient reports that she was given a diagnosis of unspecified bipolar disorder.  She reports that her primary care provider has currently diagnosed her with major depressive disorder, anxiety, and PTSD.   Patient denies a past history of hospitalization due to mental health.   Patient denies a past history of suicide attempts   Patient denies a past history of homicide attempts  Past Medical History:  Past Medical History:  Diagnosis Date   Anxiety    Complication of anesthesia    AFTER BTL IN 2006, BP BECAME VERY ELEVATED-WITH EPIDURAL DURING DELIVERY, PT STARTED VOMITING AN SHAKING AND EPIDURAL HAD TO BE STOPPED-PT STATES WITH WISDOM TEETH SHE STOPPED BREATHING AND WAS SENT FOR SLEEP STUDY WHICH WAS NEGATIVE   Depression    Fatigue    GERD (gastroesophageal reflux disease)    OCC- NO MEDS   Migraine    MIGRAINES   Mood swings    Neuropathy    Sinusitis    Thyroid nodule    Weakness     Past Surgical History:  Procedure Laterality Date   APPENDECTOMY     BREAST BIOPSY Left 09/18/2017   [place] core 10:00 FIBROEPITHELIAL LESION   BREAST BIOPSY Right 09/04/2019   affirm bx of distortion,  x marker, radial scar   BREAST EXCISIONAL BIOPSY Left 10/06/2017   lumpectomy with NP- fibroadeoma   BREAST LUMPECTOMY Right 10/03/2019   radial scar   BREAST LUMPECTOMY WITH NEEDLE LOCALIZATION Left 10/06/2017    Procedure: BREAST LUMPECTOMY WITH NEEDLE LOCALIZATION;  Surgeon: Nadeen Landau, MD;  Location: [facility];  Service: General;  Laterality: Left;   COLONOSCOPY     DILITATION & CURRETTAGE/HYSTROSCOPY WITH NOVASURE ABLATION N/A 07/17/2015   Procedure: DILATATION & CURETTAGE/HYSTEROSCOPY WITH NOVASURE ABLATION;  Surgeon: Winston Bing, MD;  Location: [facility];  Service: Gynecology;  Laterality: N/A;   EYE SURGERY     lasik   LAPAROSCOPY ABDOMEN DIAGNOSTIC     LASIK     PARTIAL MASTECTOMY WITH NEEDLE LOCALIZATION Right 10/03/2019   Procedure: PARTIAL MASTECTOMY WITH NEEDLE LOCALIZATION;  Surgeon: Sung Amabile, DO;  Location: [facility];  Service: General;  Laterality: Right;   TUBAL LIGATION     WISDOM TOOTH EXTRACTION      Family Psychiatric History:  Patient states that many of her family members cope with drug and alcohol but never talked about mental health   Family history of suicide attempt: Patient denies Family history of homicide attempt: Patient reports that her extended family members attempted homicide Family history of substance abuse: Patient reports that many of her family members abused alcohol and drugs  Family History:  Family History  Problem Relation Age of Onset   Hypertension Mother    Lung cancer Mother    Hypertension Father    Diabetes Father    Migraines Father    Heart failure Father  Migraines Brother    Breast cancer Maternal Aunt 51   Breast cancer Paternal Aunt 26   Lupus Maternal Grandmother    Arthritis Maternal Grandmother    Migraines Paternal Grandmother    Breast cancer Paternal Grandmother 88       post meno   Migraines Paternal Grandfather    Breast cancer Cousin 73   Neuropathy Neg Hx     Social History:  Social History   Socioeconomic History   Marital status: Widowed    Spouse name: Not on file   Number of children: 3   Years of education: 10   Highest education level: Not on file  Occupational History   Occupation: None   Tobacco Use   Smoking status: Every Day    Current packs/day: 0.25    Average packs/day: 0.3 packs/day for 22.0 years (5.5 ttl pk-yrs)    Types: Cigarettes   Smokeless tobacco: Never  Vaping Use   Vaping status: Never Used  Substance and Sexual Activity   Alcohol use: Not Currently    Comment: very rarely 2 to 3 times per year   Drug use: No   Sexual activity: Yes    Partners: Male    Birth control/protection: None  Other Topics Concern   Not on file  Social History Narrative   Drinks about 2 cups of caffeine drinks a day    Right handed   Lives at home with her children & fiance'   Social Determinants of Health   Financial Resource Strain: Medium Risk (08/03/2023)   Overall Financial Resource Strain (CARDIA)    Difficulty of Paying Living Expenses: Somewhat hard  Food Insecurity: Food Insecurity Present (08/03/2023)   Hunger Vital Sign    Worried About Running Out of Food in the Last Year: Sometimes true    Ran Out of Food in the Last Year: Sometimes true  Transportation Needs: No Transportation Needs (06/20/2023)   PRAPARE - Administrator, Civil Service (Medical): No    Lack of Transportation (Non-Medical): No  Physical Activity: Inactive (08/03/2023)   Exercise Vital Sign    Days of Exercise per Week: 0 days    Minutes of Exercise per Session: 0 min  Stress: Stress Concern Present (08/03/2023)   [employer] of Occupational Health - Occupational Stress Questionnaire    Feeling of Stress : Very much  Social Connections: Socially Isolated (08/03/2023)   Social Connection and Isolation Panel [NHANES]    Frequency of Communication with Friends and Family: Once a week    Frequency of Social Gatherings with Friends and Family: Once a week    Attends Religious Services: More than 4 times per year    Active Member of [employer] or Organizations: No    Attends Banker Meetings: Never    Marital Status: Widowed    Allergies:  Allergies   Allergen Reactions   Other     EPIDURAL WITH DELIVERY MADE PT VERY SICK-VOMITING AND UNCONTROLLABLE SHAKING    Metabolic Disorder Labs: Lab Results  Component Value Date   HGBA1C 5.7 (H) 02/10/2023   No results found for: "PROLACTIN" Lab Results  Component Value Date   CHOL 166 02/10/2023   TRIG 87 02/10/2023   HDL 68 02/10/2023   CHOLHDL 2.4 02/10/2023   LDLCALC 82 02/10/2023   No results found for: "TSH"  Therapeutic Level Labs: No results found for: "LITHIUM" No results found for: "VALPROATE" No results found for: "CBMZ"  Current Medications: Current Outpatient Medications  Medication Sig Dispense Refill   amphetamine-dextroamphetamine (ADDERALL) 20 MG tablet Take 1 tablet (20 mg total) by mouth daily. 30 tablet 0   botulinum toxin Type A (BOTOX) 200 units injection Provider to inject 155 units into the muscles of the head and neck every 12 weeks. Discard remainder. 1 each 3   clonazePAM (KLONOPIN) 0.5 MG tablet 1-3 tablets as needed at bed time for sleep and RLS 90 tablet 0   cyclobenzaprine (FLEXERIL) 10 MG tablet Take 1 tablet (10 mg total) by mouth 3 (three) times daily as needed. 30 tablet 0   Folic Acid-Vit B6-Vit B12 (FOLBEE) 2.5-25-1 MG TABS tablet Take 1 tablet by mouth daily. 90 tablet 2   hydrOXYzine (VISTARIL) 25 MG capsule Take 1 capsule (25 mg total) by mouth every 8 (eight) hours as needed. 30 capsule 0   mirtazapine (REMERON) 15 MG tablet Take 1 tablet (15 mg total) by mouth at bedtime. 30 tablet 1   Multiple Vitamin (MULTIVITAMIN PO) Take by mouth.     predniSONE (STERAPRED UNI-PAK 21 TAB) 5 MG (21) TBPK tablet Take by mouth as needed.     rizatriptan (MAXALT-MLT) 10 MG disintegrating tablet Take 1 tablet (10 mg total) by mouth as needed for migraine. May repeat in 2 hours if needed 12 tablet 11   Vitamin D, Ergocalciferol, (DRISDOL) 1.25 MG (50000 UNIT) CAPS capsule Take 1 capsule (50,000 Units total) by mouth every 7 (seven) days. 5 capsule 0   No  current facility-administered medications for this visit.     Musculoskeletal: Strength & Muscle Tone: within normal limits Gait & Station: normal Patient leans: N/A  Psychiatric Specialty Exam: Review of Systems  Psychiatric/Behavioral:  Positive for dysphoric mood and sleep disturbance. Negative for decreased concentration, hallucinations, self-injury and suicidal ideas. The patient is nervous/anxious. The patient is not hyperactive.     There were no vitals taken for this visit.There is no height or weight on file to calculate BMI.  General Appearance: Casual  Eye Contact:  Good  Speech:  Clear and Coherent and Normal Rate  Volume:  Normal  Mood:  Anxious and Depressed  Affect:  Congruent  Thought Process:  Coherent, Goal Directed, and Descriptions of Associations: Intact  Orientation:  Full (Time, Place, and Person)  Thought Content: WDL   Suicidal Thoughts:  No  Homicidal Thoughts:  No  Memory:  Immediate;   Good Recent;   Good Remote;   Good  Judgement:  Good  Insight:  Good  Psychomotor Activity:  Normal  Concentration:  Concentration: Good and Attention Span: Good  Recall:  Good  Fund of Knowledge: Good  Language: Good  Akathisia:  No  Handed:  Right  AIMS (if indicated): not done  Assets:  Communication Skills Desire for Improvement Housing Physical Health Social Support  ADL's:  Intact  Cognition: WNL  Sleep:  Fair   Screenings: GAD-7    Flowsheet Row Video Visit from 08/17/2023 in [facility] Counselor from 08/03/2023 in [facility] Office Visit from 07/06/2023 in [facility] Office Visit from 02/10/2023 in [facility]  Total GAD-7 Score 20 18 20 19       PHQ2-9    Flowsheet Row Video Visit from 08/17/2023 in [facility] Counselor from 08/03/2023 in [facility] Office Visit from  07/24/2023 in [facility] Office Visit from 07/06/2023 in [facility] Office Visit from 06/20/2023 in [facility] at [facility] Total Score 6 6 5 6 6   PHQ-9 Total Score 23 23 24 25 26       Flowsheet Row Video Visit from 08/17/2023 in [facility] Counselor from 08/03/2023 in [facility] Office Visit from 07/06/2023 in [facility]  C-SSRS RISK CATEGORY Low Risk Low Risk Low Risk        Assessment and Plan: ***    Collaboration of Care: Collaboration of Care: Medication Management AEB provider managing patient's psychiatric medication, Primary Care Provider AEB patient being seen by family medicine, Psychiatrist AEB patient being seen by mental health provider at this facility, Other provider involved in patient's care AEB patient being seen by neurology and gynecology, and Referral or follow-up with counselor/therapist AEB patient being seen by a licensed clinical social worker at this facility  Patient/Guardian was advised Release of Information must be obtained prior to any record release in order to collaborate their care with an outside provider. Patient/Guardian was advised if they have not already done so to contact the registration department to sign all necessary forms in order for [place] to release information regarding their care.   Consent: Patient/Guardian gives verbal consent for treatment and assignment of benefits for services provided during this visit. Patient/Guardian expressed understanding and agreed to proceed.   1. Severe episode of recurrent major depressive disorder, without psychotic features (HCC)  - mirtazapine (REMERON) 15 MG tablet; Take 1 tablet (15 mg total) by mouth at bedtime.  Dispense: 30 tablet; Refill: 1  2. PTSD (post-traumatic stress disorder)  - mirtazapine (REMERON) 15 MG tablet; Take 1 tablet  (15 mg total) by mouth at bedtime.  Dispense: 30 tablet; Refill: 1  3. Generalized anxiety disorder  - mirtazapine (REMERON) 15 MG tablet; Take 1 tablet (15 mg total) by mouth at bedtime.  Dispense: 30 tablet; Refill: 1  Patient to follow up in 6 weeks Provider spent a total of 22 minutes with the patient/reviewing patient's chart  Meta Hatchet, PA 08/18/2023, 6:35 PM

## 2023-08-22 ENCOUNTER — Other Ambulatory Visit: Payer: Self-pay | Admitting: Family Medicine

## 2023-08-23 ENCOUNTER — Other Ambulatory Visit: Payer: Self-pay

## 2023-08-23 MED ORDER — AMPHETAMINE-DEXTROAMPHETAMINE 20 MG PO TABS: 20.0000 mg | ORAL_TABLET | Freq: Every day | ORAL | 0 refills | Status: DC

## 2023-08-23 NOTE — Progress Notes (Signed)
Specialty Pharmacy Refill Coordination Note  Kelly Dorsey is a 47 y.o. female contacted today regarding refills of specialty medication(s) Onabotulinumtoxina   Patient requested Courier to Provider Office   Delivery date: 08/24/23   Verified address: [facility] [address]   Medication will be filled on 08/23/23.

## 2023-08-24 ENCOUNTER — Ambulatory Visit (HOSPITAL_COMMUNITY): Payer: Medicaid Other | Admitting: Licensed Clinical Social Worker

## 2023-08-28 ENCOUNTER — Ambulatory Visit: Payer: Medicaid Other | Admitting: Adult Health

## 2023-08-28 DIAGNOSIS — G43709 Chronic migraine without aura, not intractable, without status migrainosus: Secondary | ICD-10-CM

## 2023-08-28 MED ORDER — ONABOTULINUMTOXINA 200 UNITS IJ SOLR
155.0000 [IU] | Freq: Once | INTRAMUSCULAR | Status: AC
Start: 2023-08-28 — End: 2023-08-28
  Administered 2023-08-28: 155 [IU] via INTRAMUSCULAR

## 2023-08-28 NOTE — Progress Notes (Signed)
Botox- 200 units x 2 vial Lot: [id number] Expiration: 05/2025 NDC: 4034-7425-95  Bacteriostatic 0.9% Sodium Chloride- 4mL total [id number] Expiration: 01/23/2024 NDC: 2951-8841-66  Dx: G43.709 SP  Witnessed by: Clemencia Course.

## 2023-08-28 NOTE — Progress Notes (Signed)
Patient returns for repeat Botox.  Prior injection 03/13/2023 with Aundra Millet, NP, previously scheduled for repeat botox in August but did not show due to not feeling well. Reports continued migraines, no significant benefit with botox although at prior visit in May noted only a few migraines since prior injection in February, currently having about 10-16 migraines per month which are debilitating, has fairly consistent low level headaches and neck pain. Discussed time in between botox treatments possibly contributing to perceived lack of benefit. Also discussed alternative options such as Vyepti, provided further information, will consider and call if interested in pursuing.  Use of rizatriptan with benefit, will continue.       Consent Form Botulism Toxin Injection For Chronic Migraine    Reviewed orally with patient, additionally signature is on file:  Botulism toxin has been approved by the Federal drug administration for treatment of chronic migraine. Botulism toxin does not cure chronic migraine and it may not be effective in some patients.  The administration of botulism toxin is accomplished by injecting a small amount of toxin into the muscles of the neck and head. Dosage must be titrated for each individual. Any benefits resulting from botulism toxin tend to wear off after 3 months with a repeat injection required if benefit is to be maintained. Injections are usually done every 3-4 months with maximum effect peak achieved by about 2 or 3 weeks. Botulism toxin is expensive and you should be sure of what costs you will incur resulting from the injection.  The side effects of botulism toxin use for chronic migraine may include:   -Transient, and usually mild, facial weakness with facial injections  -Transient, and usually mild, head or neck weakness with head/neck injections  -Reduction or loss of forehead facial animation due to forehead muscle weakness  -Eyelid drooping  -Dry  eye  -Pain at the site of injection or bruising at the site of injection  -Double vision  -Potential unknown long term risks   Contraindications: You should not have Botox if you are pregnant, nursing, allergic to albumin, have an infection, skin condition, or muscle weakness at the site of the injection, or have myasthenia gravis, Lambert-Eaton syndrome, or ALS.  It is also possible that as with any injection, there may be an allergic reaction or no effect from the medication. Reduced effectiveness after repeated injections is sometimes seen and rarely infection at the injection site may occur. All care will be taken to prevent these side effects. If therapy is given over a long time, atrophy and wasting in the muscle injected may occur. Occasionally the patient's become refractory to treatment because they develop antibodies to the toxin. In this event, therapy needs to be modified.  I have read the above information and consent to the administration of botulism toxin.    BOTOX PROCEDURE NOTE FOR MIGRAINE HEADACHE  Contraindications and precautions discussed with patient(above). Aseptic procedure was observed and patient tolerated procedure. Procedure performed by Ihor Austin, AGNP-BC.   The condition has existed for more than 6 months, and pt does not have a diagnosis of ALS, Myasthenia Gravis or Lambert-Eaton Syndrome.  Risks and benefits of injections discussed and pt agrees to proceed with the procedure.  Written consent obtained  These injections are medically necessary. Pt  receives good benefits from these injections. These injections do not cause sedations or hallucinations which the oral therapies may cause.   Description of procedure:  The patient was placed in a sitting position. The standard  protocol was used for Botox as follows, with 5 units of Botox injected at each site:  -Procerus muscle, midline injection  -Corrugator muscle, bilateral injection  -Frontalis  muscle, bilateral injection, with 2 sites each side, medial injection was performed in the upper one third of the frontalis muscle, in the region vertical from the medial inferior edge of the superior orbital rim. The lateral injection was again in the upper one third of the forehead vertically above the lateral limbus of the cornea, 1.5 cm lateral to the medial injection site.  -Temporalis muscle injection, 4 sites, bilaterally. The first injection was 3 cm above the tragus of the ear, second injection site was 1.5 cm to 3 cm up from the first injection site in line with the tragus of the ear. The third injection site was 1.5-3 cm forward between the first 2 injection sites. The fourth injection site was 1.5 cm posterior to the second injection site. 5th site laterally in the temporalis  muscleat the level of the outer canthus.  -Occipitalis muscle injection, 3 sites, bilaterally. The first injection was done one half way between the occipital protuberance and the tip of the mastoid process behind the ear. The second injection site was done lateral and superior to the first, 1 fingerbreadth from the first injection. The third injection site was 1 fingerbreadth superiorly and medially from the first injection site.  -Cervical paraspinal muscle injection, 2 sites, bilaterally. The first injection site was 1 cm from the midline of the cervical spine, 3 cm inferior to the lower border of the occipital protuberance. The second injection site was 1.5 cm superiorly and laterally to the first injection site.  -Trapezius muscle injection was performed at 3 sites, bilaterally. The first injection site was in the upper trapezius muscle halfway between the inflection point of the neck, and the acromion. The second injection site was one half way between the acromion and the first injection site. The third injection was done between the first injection site and the inflection point of the neck.    A total of 200  units of Botox was prepared, 155 units of Botox was injected as documented above, any Botox not injected was wasted. The patient tolerated the procedure well, there were no complications of the above procedure.   Ihor Austin, AGNP-BC  [facility] [address] [place], [place] [address]  Phone [phone] Fax [phone] Note: This document was prepared with digital dictation and possible smart phrase technology. Any transcriptional errors that result from this process are unintentional.

## 2023-08-30 ENCOUNTER — Other Ambulatory Visit: Payer: Self-pay

## 2023-09-04 ENCOUNTER — Ambulatory Visit (INDEPENDENT_AMBULATORY_CARE_PROVIDER_SITE_OTHER): Payer: Medicaid Other | Admitting: Family Medicine

## 2023-09-04 ENCOUNTER — Encounter: Payer: Self-pay | Admitting: Family Medicine

## 2023-09-04 VITALS — BP 120/84 | HR 99 | Ht 66.0 in | Wt 132.1 lb

## 2023-09-04 DIAGNOSIS — F332 Major depressive disorder, recurrent severe without psychotic features: Secondary | ICD-10-CM

## 2023-09-04 DIAGNOSIS — G2581 Restless legs syndrome: Secondary | ICD-10-CM

## 2023-09-04 DIAGNOSIS — E531 Pyridoxine deficiency: Secondary | ICD-10-CM

## 2023-09-04 DIAGNOSIS — R5383 Other fatigue: Secondary | ICD-10-CM

## 2023-09-04 DIAGNOSIS — R6889 Other general symptoms and signs: Secondary | ICD-10-CM

## 2023-09-04 DIAGNOSIS — E559 Vitamin D deficiency, unspecified: Secondary | ICD-10-CM | POA: Diagnosis not present

## 2023-09-04 DIAGNOSIS — R1013 Epigastric pain: Secondary | ICD-10-CM | POA: Diagnosis not present

## 2023-09-04 NOTE — Patient Instructions (Addendum)
It was nice to see you today,  We addressed the following topics today: -For the stuffiness in your ears you can either switch to using 2 sprays in each nostril Flonase or you can switch to a alternative nasal steroid such as the generic versions of Nasacort or Rhinocort - Your B6 level was low in the past.  I would like to repeat this to make sure it has improved. - I will get a H. pylori test today to check for a stomach infection that may be causing your epigastric pain. - I have ordered a nocturnal pulse oximetry test.  This will be able to tell if you are desaturating at night.  Have a great day,  Frederic Jericho, MD

## 2023-09-04 NOTE — Progress Notes (Unsigned)
   Established Patient Office Visit  Subjective   Patient ID: Kelly Dorsey, female    DOB: 14-May-1976  Age: 47 y.o. MRN: [id number]  Chief Complaint  Patient presents with   Medical Management of Chronic Issues    HPI  Patient complaining of a heaviness feeling in her head when she wakes up in the morning.  Sometimes this is associated with stiffness in the neck.  Sometimes not.  Sometimes this sensation lasts all day.  Feels like she has trouble lifting her head off the pillow.  Has had MRI in the past.  Has had significant lab workup that was all unrevealing.  Has been dealing with this issue for quite a long time.  Patient also wants a repeat sleep apnea test if possible.  States that she still has issues with snoring, apnea, restlessness.  Patient has gastroenterology appointment in January.  Patient complains of bloating and epigastric pain.  States that acid reflux has been worse since starting the Remeron.  Only taking Tums as needed right now.  Not taking any PPI or H2 blocker.  Patient has decreased her Klonopin use to half a tablet at night.  States that she is more restless with this.  Has not noticed any change in her complaint of heaviness of her head since decreasing her Klonopin.  In the past has tried gabapentin and ropinirole.  Did not tolerate either of these.  Medication she takes at night are the Klonopin and Remeron.   The 10-year ASCVD risk score (Arnett DK, et al., 2019) is: 1.4%  Health Maintenance Due  Topic Date Due   HIV Screening  Never done   Hepatitis C Screening  Never done   DTaP/Tdap/Td (1 - Tdap) Never done   Cervical Cancer Screening (HPV/Pap Cotest)  Never done   Colonoscopy  Never done   COVID-19 Vaccine (1 - 2023-24 season) Never done      Objective:     BP 120/84   Pulse 99   Ht 5\' 6"  (1.676 m)   Wt 132 lb 1.9 oz (59.9 kg)   SpO2 99%   BMI 21.32 kg/m  {Vitals History (Optional):23777}  Physical Exam General: Alert,  oriented HEENT: Normal tympanic membrane bilaterally Psych: Blunted affect.   No results found for any visits on 09/04/23.      Assessment & Plan:   Dyspepsia -     H. pylori breath test  Vitamin B6 deficiency -     Vitamin B6  Other fatigue -     Overnight Pulse Oximetry Study; Future  Vitamin D deficiency -     VITAMIN D 25 Hydroxy (Vit-D Deficiency, Fractures); Future     Return in about 3 months (around 12/05/2023) for mood, GI.    Sandre Kitty, MD

## 2023-09-05 ENCOUNTER — Telehealth: Payer: Self-pay

## 2023-09-05 ENCOUNTER — Ambulatory Visit (INDEPENDENT_AMBULATORY_CARE_PROVIDER_SITE_OTHER): Payer: Medicaid Other | Admitting: Licensed Clinical Social Worker

## 2023-09-05 DIAGNOSIS — F4310 Post-traumatic stress disorder, unspecified: Secondary | ICD-10-CM | POA: Diagnosis not present

## 2023-09-05 DIAGNOSIS — R6889 Other general symptoms and signs: Secondary | ICD-10-CM | POA: Insufficient documentation

## 2023-09-05 DIAGNOSIS — F332 Major depressive disorder, recurrent severe without psychotic features: Secondary | ICD-10-CM

## 2023-09-05 DIAGNOSIS — E531 Pyridoxine deficiency: Secondary | ICD-10-CM | POA: Insufficient documentation

## 2023-09-05 DIAGNOSIS — F411 Generalized anxiety disorder: Secondary | ICD-10-CM | POA: Diagnosis not present

## 2023-09-05 DIAGNOSIS — R1013 Epigastric pain: Secondary | ICD-10-CM | POA: Insufficient documentation

## 2023-09-05 DIAGNOSIS — E559 Vitamin D deficiency, unspecified: Secondary | ICD-10-CM | POA: Insufficient documentation

## 2023-09-05 NOTE — Telephone Encounter (Signed)
Pt is going to try and come back tomorrow

## 2023-09-05 NOTE — Assessment & Plan Note (Signed)
Patient concerned that despite her previous sleep study she still has some apnea or nocturnal hypoxia.  She would like another sleep study.  We discussed starting with nocturnal pulse oximetry.  Patient agreeable to this.  Order placed.

## 2023-09-05 NOTE — Telephone Encounter (Signed)
Called pt LVM to contact the office °

## 2023-09-05 NOTE — Telephone Encounter (Signed)
Pt contacted the office she will come in before the week is out to redo the test

## 2023-09-05 NOTE — Assessment & Plan Note (Signed)
Patient complains of sensation of neck and head heaviness in the morning, sometimes associated with neck stiffness.  She has had chronic issues with headaches, neck pain.  Sees neurologist for difficult to treat migraines.  Gets Botox injections.  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  Extensive workup within the past 8 months has included negative ESR, CRP, myasthenia gravis.  Patient states she has seen rheumatology in the past.  I do not have any notes of their workup in her chart though.  I cannot think of any additional workup that would lead to a possible cause of these symptoms.  Seems most likely related to medication and depression

## 2023-09-05 NOTE — Assessment & Plan Note (Signed)
Continues to see psychiatry.  Recently increase mirtazapine to 15 mg.

## 2023-09-05 NOTE — Progress Notes (Signed)
THERAPIST PROGRESS NOTE  Session Time: 52  Participation Level: Active  Behavioral Response: CasualAlertAnxious and Depressed  Type of Therapy: Individual Therapy  Treatment Goals addressed:  Active     Anxiety     LTG: Shaynee will score less than 5 on the Generalized Anxiety Disorder 7 Scale (GAD-7)      Start:  08/03/23    Expected End:  01/19/24         STG: Lawanna Kobus will participate in at least 80% of scheduled individual psychotherapy sessions  (Progressing)     Start:  08/03/23    Expected End:  01/19/24         STG: Lawanna Kobus will attend couples/family counseling sessions with partner/family member 1x per week for the next 4 weeks.  (Progressing)     Start:  08/03/23    Expected End:  01/19/24         identify 3 trigger for anxiety  (Progressing)     Start:  08/03/23    Expected End:  01/19/24         identify three coping skills anxiety      Start:  08/03/23    Expected End:  01/19/24         Review results of GAD-7 with Lawanna Kobus to track progress     Start:  08/03/23         Work with Lawanna Kobus to track symptoms, triggers, and/or skill use through a mood chart, diary card, or journal     Start:  08/03/23         Perform psychoeducation regarding anxiety disorders     Start:  08/03/23         Discuss risks and benefits of medication treatment options for this problem and prescribe as indicated (Completed)     Start:  08/03/23    End:  09/05/23      Encourage Lawanna Kobus to take psychotropic medication(s) as prescribed (Completed)     Start:  08/03/23    End:  09/05/23        OP Depression     LTG: Reduce frequency, intensity, and duration of depression symptoms so that daily functioning is improved (Progressing)     Start:  08/03/23    Expected End:  01/19/24         LTG: Increase coping skills to manage depression and improve ability to perform daily activities (Progressing)     Start:  08/03/23    Expected End:  01/19/24         LTG: Akeira will  score less than 10 on the Patient Health Questionnaire (PHQ-9)      Start:  08/03/23    Expected End:  01/19/24         Identify 3 coping skills depression      Start:  08/03/23    Expected End:  01/19/24         identify 3 trigger depression      Start:  08/03/23    Expected End:  01/19/24         Work with Lawanna Kobus to track symptoms, triggers, and/or skill use through a mood chart, diary card, or journal     Start:  08/03/23         Encourage Aryanne to participate in recovery peer support activities weekly      Start:  08/03/23         Therapist will administer the PHQ-9 at weekly intervals for the next 8 weeks  Start:  08/03/23         Work with Lawanna Kobus to identify the major components of a recent episode of depression: physical symptoms, major thoughts and images, and major behaviors they experienced     Start:  08/03/23         Reylee will identify 3 personal goals for managing depression symptoms to work on during the current treatment episode     Start:  08/03/23         Provide [employer] educational information and reading material on dissociation, its causes, and symptoms (Completed)     Start:  08/03/23    End:  09/05/23         ProgressTowards Goals: Progressing  Interventions: CBT and Motivational Interviewing  Summary: [name] is a 47 y.o. female who presents with depressed and anxious mood\affect.  Patient was pleasant, cooperative, maintained good eye contact.  She engaged well in therapy session was dressed casually.  Patient presented today with her fianc.  She reports that things have been stabilizing as far as her moods since starting Remeron.  Neysa Bonito does report that he has noticed since starting Remeron that she has been more jittery at night.  Patient reports that she is less irritable and having less mood swings.  Patient reports some stressors for family conflict.  She utilized the example of her 80 year old son being shot in March 2024  and awaiting to see if federal charges will be pressed against him.   Suicidal/Homicidal: Nowithout intent/plan  Therapist Response:   Intervention/plan: LCSW psycho analytic therapy for patient to express thoughts, feelings and emotions utilizing nonjudgmental stance.  LCSW supportive therapy for praise and encouragement.  LCSW educated patient on taking medications consistently and as prescribed.  LCSW encourage patient to track symptoms daily to see if there is any increase or decrease.    Plan: Return again in 3 weeks.  Diagnosis: No diagnosis found.  Collaboration of Care: Other None   Patient/Guardian was advised Release of Information must be obtained prior to any record release in order to collaborate their care with an outside provider. Patient/Guardian was advised if they have not already done so to contact the registration department to sign all necessary forms in order for [place] to release information regarding their care.   Consent: Patient/Guardian gives verbal consent for treatment and assignment of benefits for services provided during this visit. Patient/Guardian expressed understanding and agreed to proceed.   Weber Cooks, LCSW 09/05/2023

## 2023-09-05 NOTE — Telephone Encounter (Signed)
[facility] called reporting that the H Pylori breath test was going to be cancelled due to the name on breath bags are missing.  Pt will need to redo.

## 2023-09-05 NOTE — Assessment & Plan Note (Signed)
Patient complains of epigastric pain and bloating and worsening of previously controlled reflux symptoms..  Her gastroenterology appointment is in January.  Not currently taking PPI or H2 blocker.  Only taking Tums.  Will get urea breath test today and treat if positive

## 2023-09-05 NOTE — Assessment & Plan Note (Addendum)
Patient has tried gabapentin and ropinirole in the past.  Klonopin has been the only thing that works for her.  She has weaned herself down to half a tablet at night.  Has noticed increase symptoms of restlessness but does not want to increase her Klonopin back up.  Will check iron when patient gets labs.

## 2023-09-11 ENCOUNTER — Telehealth: Payer: Self-pay

## 2023-09-11 NOTE — Telephone Encounter (Signed)
Called pt Kelly Dorsey to contact the office °

## 2023-09-11 NOTE — Telephone Encounter (Signed)
Called pt LVM to contact the office °

## 2023-09-15 ENCOUNTER — Other Ambulatory Visit (HOSPITAL_COMMUNITY): Payer: Self-pay | Admitting: Physician Assistant

## 2023-09-15 ENCOUNTER — Other Ambulatory Visit: Payer: Medicaid Other

## 2023-09-15 DIAGNOSIS — F411 Generalized anxiety disorder: Secondary | ICD-10-CM

## 2023-09-15 DIAGNOSIS — E559 Vitamin D deficiency, unspecified: Secondary | ICD-10-CM

## 2023-09-15 DIAGNOSIS — G2581 Restless legs syndrome: Secondary | ICD-10-CM

## 2023-09-15 DIAGNOSIS — F332 Major depressive disorder, recurrent severe without psychotic features: Secondary | ICD-10-CM

## 2023-09-15 DIAGNOSIS — F4310 Post-traumatic stress disorder, unspecified: Secondary | ICD-10-CM

## 2023-09-15 DIAGNOSIS — E531 Pyridoxine deficiency: Secondary | ICD-10-CM

## 2023-09-17 LAB — H. PYLORI BREATH TEST: H pylori Breath Test: NEGATIVE

## 2023-09-21 LAB — VITAMIN D 25 HYDROXY (VIT D DEFICIENCY, FRACTURES): Vit D, 25-Hydroxy: 44.3 ng/mL (ref 30.0–100.0)

## 2023-09-21 LAB — IRON,TIBC AND FERRITIN PANEL
Ferritin: 66 ng/mL (ref 15–150)
Iron Saturation: 14 % — ABNORMAL LOW (ref 15–55)
Iron: 42 ug/dL (ref 27–159)
Total Iron Binding Capacity: 296 ug/dL (ref 250–450)
UIBC: 254 ug/dL (ref 131–425)

## 2023-09-21 LAB — VITAMIN B6: Vitamin B6: 7.3 ug/L (ref 3.4–65.2)

## 2023-09-25 ENCOUNTER — Other Ambulatory Visit: Payer: Self-pay | Admitting: Family Medicine

## 2023-09-26 ENCOUNTER — Telehealth (HOSPITAL_COMMUNITY): Payer: Self-pay | Admitting: Licensed Clinical Social Worker

## 2023-09-26 MED ORDER — AMPHETAMINE-DEXTROAMPHETAMINE 20 MG PO TABS: 20.0000 mg | ORAL_TABLET | Freq: Every day | ORAL | 0 refills | Status: DC

## 2023-09-26 NOTE — Telephone Encounter (Signed)
LCSW called patient to notify her of switching appointment on 10/02/2023 to virtual.  Kelly Dorsey was agreeable to virtual appointment.  Front desk staff notified to change appointment to virtual.

## 2023-09-28 ENCOUNTER — Encounter (HOSPITAL_COMMUNITY): Payer: Self-pay | Admitting: Physician Assistant

## 2023-09-28 ENCOUNTER — Telehealth (HOSPITAL_COMMUNITY): Payer: Medicaid Other | Admitting: Physician Assistant

## 2023-09-28 DIAGNOSIS — F411 Generalized anxiety disorder: Secondary | ICD-10-CM

## 2023-09-28 DIAGNOSIS — F4310 Post-traumatic stress disorder, unspecified: Secondary | ICD-10-CM

## 2023-09-28 DIAGNOSIS — F332 Major depressive disorder, recurrent severe without psychotic features: Secondary | ICD-10-CM

## 2023-09-28 MED ORDER — VENLAFAXINE HCL ER 37.5 MG PO CP24: 37.5000 mg | ORAL_CAPSULE | Freq: Every day | ORAL | 1 refills | Status: DC

## 2023-09-28 MED ORDER — MIRTAZAPINE 15 MG PO TABS: 15.0000 mg | ORAL_TABLET | Freq: Every day | ORAL | 0 refills | Status: DC

## 2023-09-28 NOTE — Progress Notes (Cosign Needed)
BH MD/PA/NP OP Progress Note  Virtual Visit via Video Note  I connected with [name] on 09/28/23 at  4:30 PM EST by a video enabled telemedicine application and verified that I am speaking with the correct person using two identifiers.  Location: Patient: Home Provider: Clinic   I discussed the limitations of evaluation and management by telemedicine and the availability of in person appointments. The patient expressed understanding and agreed to proceed.  Follow Up Instructions:  I discussed the assessment and treatment plan with the patient. The patient was provided an opportunity to ask questions and all were answered. The patient agreed with the plan and demonstrated an understanding of the instructions.   The patient was advised to call back or seek an in-person evaluation if the symptoms worsen or if the condition fails to improve as anticipated.  I provided 31 minutes of non-face-to-face time during this encounter.  Meta Hatchet, PA    09/28/2023 5:33 PM WINDELL MELOTT  MRN:  [id number]  Chief Complaint:  Chief Complaint  Patient presents with   Follow-up   Medication Management   HPI:   Modupe L. Farrell is a 47 year old female with a past psychiatric history significant for major depressive disorder (severe episode, recurrent, without psychotic features), generalized anxiety disorder, and PTSD who presents to [facility] via virtual video visit for follow-up and medication management.  Patient is currently being managed on the following psychiatric medication: Mirtazapine 15 mg at bedtime.  Patient presents today encounter stating that she feels more irritable due to stressors in her life.  She reports that her son was recently shot and now she is dealing with the court related meetings.  Patient states that she is also taking care of her grandmother and reports that all of her vehicles stopped working at the same  time.  Patient also reports that since the weather has changed, her migraines have been acting up.  She reports that her migraines gets very bad that she will occasionally wake up with swollen eyes.  Patient rates her depression as 7 or 8 out of 10 with 10 being most severe.  Patient endorses depressive episodes every day.  Patient endorses the following depressive symptoms: feelings of sadness, lack of motivation, fatigue (due to physical health issues), and irritability.  Due to her symptoms, patient reports that she is always overweight when going to her grandmother's home.  Patient reports that she feels like she does not have the will to live half the time.  In addition to depression, patient endorses anxiety and rates her anxiety as 7 or 8 out of 10.  Patient states that she still struggles with going into grocery stores or public places.  Though patient continues to experience her depression and anxiety, she reports that she has noticed a slight difference since being put on mirtazapine.  Patient reports that she is not prone to lashing out as much and states that she is able to hold her emotions in.  A PHQ-9 screen was performed with the patient scoring a 25.  A GAD-7 screen was also performed with the patient scoring a 15.  Patient is alert and oriented x 4, calm, cooperative, and fully engaged in conversation during the encounter.  Patient endorses angry mood.  Patient denies suicidal or homicidal ideations.  She further denies auditory or visual hallucinations and does not appear to be responding to internal/external stimuli.  Patient endorses fair sleep and receives an average 4  to 10 hours of sleep per night.  Patient endorses fair appetite and eats on average 2 meals per day.  Patient denies alcohol consumption or illicit drug use.  Patient endorses tobacco use and smokes on average 5 cigarettes/day.  Visit Diagnosis:    ICD-10-CM   1. Severe episode of recurrent major depressive disorder,  without psychotic features (HCC)  F33.2 mirtazapine (REMERON) 15 MG tablet    venlafaxine XR (EFFEXOR-XR) 37.5 MG 24 hr capsule    2. PTSD (post-traumatic stress disorder)  F43.10 mirtazapine (REMERON) 15 MG tablet    venlafaxine XR (EFFEXOR-XR) 37.5 MG 24 hr capsule    3. Generalized anxiety disorder  F41.1 mirtazapine (REMERON) 15 MG tablet    venlafaxine XR (EFFEXOR-XR) 37.5 MG 24 hr capsule      Past Psychiatric History:  Patient reports that she was given a diagnosis of unspecified bipolar disorder.  She reports that her primary care provider has currently diagnosed her with major depressive disorder, anxiety, and PTSD.   Patient denies a past history of hospitalization due to mental health.   Patient denies a past history of suicide attempts   Patient denies a past history of homicide attempts  Past Medical History:  Past Medical History:  Diagnosis Date   Anxiety    Complication of anesthesia    AFTER BTL IN 2006, BP BECAME VERY ELEVATED-WITH EPIDURAL DURING DELIVERY, PT STARTED VOMITING AN SHAKING AND EPIDURAL HAD TO BE STOPPED-PT STATES WITH WISDOM TEETH SHE STOPPED BREATHING AND WAS SENT FOR SLEEP STUDY WHICH WAS NEGATIVE   Depression    Fatigue    GERD (gastroesophageal reflux disease)    OCC- NO MEDS   Migraine    MIGRAINES   Mood swings    Neuropathy    Sinusitis    Thyroid nodule    Weakness     Past Surgical History:  Procedure Laterality Date   APPENDECTOMY     BREAST BIOPSY Left 09/18/2017   [place] core 10:00 FIBROEPITHELIAL LESION   BREAST BIOPSY Right 09/04/2019   affirm bx of distortion,  x marker, radial scar   BREAST EXCISIONAL BIOPSY Left 10/06/2017   lumpectomy with NP- fibroadeoma   BREAST LUMPECTOMY Right 10/03/2019   radial scar   BREAST LUMPECTOMY WITH NEEDLE LOCALIZATION Left 10/06/2017   Procedure: BREAST LUMPECTOMY WITH NEEDLE LOCALIZATION;  Surgeon: Nadeen Landau, MD;  Location: [facility] ORS;  Service: General;  Laterality: Left;    COLONOSCOPY     DILITATION & CURRETTAGE/HYSTROSCOPY WITH NOVASURE ABLATION N/A 07/17/2015   Procedure: DILATATION & CURETTAGE/HYSTEROSCOPY WITH NOVASURE ABLATION;  Surgeon: Hutchinson Bing, MD;  Location: [facility] OR[facility];  Service: Gynecology;  Laterality: N/A;   EYE SURGERY     lasik   LAPAROSCOPY ABDOMEN DIAGNOSTIC     LASIK     PARTIAL MASTECTOMY WITH NEEDLE LOCALIZATION Right 10/03/2019   Procedure: PARTIAL MASTECTOMY WITH NEEDLE LOCALIZATION;  Surgeon: Sung Amabile, DO;  Location: [facility] OR[facility];  Service: General;  Laterality: Right;   TUBAL LIGATION     WISDOM TOOTH EXTRACTION      Family Psychiatric History:  Patient states that many of her family members cope with drug and alcohol but never talked about mental health   Family history of suicide attempt: Patient denies Family history of homicide attempt: Patient reports that her extended family members attempted homicide Family history of substance abuse: Patient reports that many of her family members abused alcohol and drugs  Family History:  Family History  Problem Relation Age of Onset  Hypertension Mother    Lung cancer Mother    Hypertension Father    Diabetes Father    Migraines Father    Heart failure Father    Migraines Brother    Breast cancer Maternal Aunt 27   Breast cancer Paternal Aunt 82   Lupus Maternal Grandmother    Arthritis Maternal Grandmother    Migraines Paternal Grandmother    Breast cancer Paternal Grandmother 24       post meno   Migraines Paternal Grandfather    Breast cancer Cousin 74   Neuropathy Neg Hx     Social History:  Social History   Socioeconomic History   Marital status: Widowed    Spouse name: Not on file   Number of children: 3   Years of education: 10   Highest education level: Not on file  Occupational History   Occupation: None  Tobacco Use   Smoking status: Every Day    Current packs/day: 0.25    Average packs/day: 0.3 packs/day for 22.0 years (5.5 ttl pk-yrs)    Types:  Cigarettes   Smokeless tobacco: Never  Vaping Use   Vaping status: Never Used  Substance and Sexual Activity   Alcohol use: Not Currently    Comment: very rarely 2 to 3 times per year   Drug use: No   Sexual activity: Yes    Partners: Male    Birth control/protection: None  Other Topics Concern   Not on file  Social History Narrative   Drinks about 2 cups of caffeine drinks a day    Right handed   Lives at home with her children & fiance'   Social Determinants of Health   Financial Resource Strain: Medium Risk (08/03/2023)   Overall Financial Resource Strain (CARDIA)    Difficulty of Paying Living Expenses: Somewhat hard  Food Insecurity: Food Insecurity Present (08/03/2023)   Hunger Vital Sign    Worried About Running Out of Food in the Last Year: Sometimes true    Ran Out of Food in the Last Year: Sometimes true  Transportation Needs: No Transportation Needs (06/20/2023)   PRAPARE - Administrator, Civil Service (Medical): No    Lack of Transportation (Non-Medical): No  Physical Activity: Inactive (08/03/2023)   Exercise Vital Sign    Days of Exercise per Week: 0 days    Minutes of Exercise per Session: 0 min  Stress: Stress Concern Present (08/03/2023)   [employer] of Occupational Health - Occupational Stress Questionnaire    Feeling of Stress : Very much  Social Connections: Socially Isolated (08/03/2023)   Social Connection and Isolation Panel [NHANES]    Frequency of Communication with Friends and Family: Once a week    Frequency of Social Gatherings with Friends and Family: Once a week    Attends Religious Services: More than 4 times per year    Active Member of [employer] or Organizations: No    Attends Banker Meetings: Never    Marital Status: Widowed    Allergies:  Allergies  Allergen Reactions   Other     EPIDURAL WITH DELIVERY MADE PT VERY SICK-VOMITING AND UNCONTROLLABLE SHAKING    Metabolic Disorder Labs: Lab Results   Component Value Date   HGBA1C 5.7 (H) 02/10/2023   No results found for: "PROLACTIN" Lab Results  Component Value Date   CHOL 166 02/10/2023   TRIG 87 02/10/2023   HDL 68 02/10/2023   CHOLHDL 2.4 02/10/2023   LDLCALC 82 02/10/2023  No results found for: "TSH"  Therapeutic Level Labs: No results found for: "LITHIUM" No results found for: "VALPROATE" No results found for: "CBMZ"  Current Medications: Current Outpatient Medications  Medication Sig Dispense Refill   venlafaxine XR (EFFEXOR-XR) 37.5 MG 24 hr capsule Take 1 capsule (37.5 mg total) by mouth daily with breakfast. 30 capsule 1   amphetamine-dextroamphetamine (ADDERALL) 20 MG tablet Take 1 tablet (20 mg total) by mouth daily. 30 tablet 0   botulinum toxin Type A (BOTOX) 200 units injection Provider to inject 155 units into the muscles of the head and neck every 12 weeks. Discard remainder. 1 each 3   clonazePAM (KLONOPIN) 0.5 MG tablet 1-3 tablets as needed at bed time for sleep and RLS 90 tablet 0   hydrOXYzine (VISTARIL) 25 MG capsule Take 1 capsule (25 mg total) by mouth every 8 (eight) hours as needed. 30 capsule 0   mirtazapine (REMERON) 15 MG tablet Take 1 tablet (15 mg total) by mouth at bedtime. 90 tablet 0   Multiple Vitamin (MULTIVITAMIN PO) Take by mouth.     rizatriptan (MAXALT-MLT) 10 MG disintegrating tablet Take 1 tablet (10 mg total) by mouth as needed for migraine. May repeat in 2 hours if needed 12 tablet 11   Vitamin D, Ergocalciferol, (DRISDOL) 1.25 MG (50000 UNIT) CAPS capsule Take 1 capsule (50,000 Units total) by mouth every 7 (seven) days. 5 capsule 0   No current facility-administered medications for this visit.     Musculoskeletal: Strength & Muscle Tone: within normal limits Gait & Station: normal Patient leans: N/A  Psychiatric Specialty Exam: Review of Systems  Psychiatric/Behavioral:  Positive for dysphoric mood and sleep disturbance. Negative for decreased concentration,  hallucinations, self-injury and suicidal ideas. The patient is nervous/anxious. The patient is not hyperactive.     There were no vitals taken for this visit.There is no height or weight on file to calculate BMI.  General Appearance: Casual  Eye Contact:  Good  Speech:  Clear and Coherent and Normal Rate  Volume:  Normal  Mood:  Anxious and Depressed  Affect:  Congruent  Thought Process:  Coherent, Goal Directed, and Descriptions of Associations: Intact  Orientation:  Full (Time, Place, and Person)  Thought Content: WDL   Suicidal Thoughts:  No  Homicidal Thoughts:  No  Memory:  Immediate;   Good Recent;   Good Remote;   Good  Judgement:  Good  Insight:  Good  Psychomotor Activity:  Normal  Concentration:  Concentration: Good and Attention Span: Good  Recall:  Good  Fund of Knowledge: Good  Language: Good  Akathisia:  No  Handed:  Right  AIMS (if indicated): not done  Assets:  Communication Skills Desire for Improvement Housing Physical Health Social Support  ADL's:  Intact  Cognition: WNL  Sleep:  Fair   Screenings: GAD-7    Flowsheet Row Video Visit from 09/28/2023 in [facility] Office Visit from 09/04/2023 in [facility] Video Visit from 08/17/2023 in [facility] Counselor from 08/03/2023 in [facility] Office Visit from 07/06/2023 in [facility]  Total GAD-7 Score 15 18 20 18 20       PHQ2-9    Flowsheet Row Video Visit from 09/28/2023 in [facility] Office Visit from 09/04/2023 in [facility] Primary Care at [facility] Video Visit from 08/17/2023 in [facility] Counselor from 08/03/2023 in [facility] Office Visit from 07/24/2023 in [facility] Primary Care at [facility] Total Score 6 4 6 6 5   PHQ-9 Total Score 25 20 23 23 24        Flowsheet Row Video Visit from 09/28/2023 in [facility] Video Visit from 08/17/2023 in [facility] Counselor from 08/03/2023 in [facility]  C-SSRS RISK CATEGORY Low Risk Low Risk Low Risk        Assessment and Plan:   Baila L. Bacik is a 47 year old female with a past psychiatric history significant for major depressive disorder (severe episode, recurrent, without psychotic features), generalized anxiety disorder, and PTSD who presents to [facility] via virtual video visit for follow-up and medication management.  Patient presents today encounter stating that she has been experiencing worsening depression and anxiety attributed to multiple stressors in her life.  Very patient continues to endorse worsening depression and anxiety, patient has noticed a slight improvement in her mood since being placed on mirtazapine.  Patient reports that she has been on the following psychiatric medications: Cymbalta, Lexapro, lithium, Abilify, trazodone, Seroquel, Paxil, Prozac, and amitriptyline.  Provider recommended patient be placed on Effexor 37.5 mg daily for the management of her depressive symptoms and anxiety.  Patient was agreeable to recommendation.  Patient's medications to be e-prescribed to pharmacy of choice.  Prior to the conclusion of the encounter, patient reports that she experiences the phenomenon where whenever she eats bananas, she ends up cramping in her lower legs, hands, and feet.  On occasion she reports that her fingers are also locked up.  Patient states that this that she has only happened whenever she eats bananas and takes her medication (mirtazapine).  Provider informed patient that there is a left likelihood that mirtazapine is causing her issues; however, patient was informed that the issue would be looked into.  Collaboration of Care: Collaboration  of Care: Medication Management AEB provider managing patient's psychiatric medication, Primary Care Provider AEB patient being seen by family medicine, Psychiatrist AEB patient being seen by mental health provider at this facility, Other provider involved in patient's care AEB patient being seen by neurology and gynecology, and Referral or follow-up with counselor/therapist AEB patient being seen by a licensed clinical social worker at this facility  Patient/Guardian was advised Release of Information must be obtained prior to any record release in order to collaborate their care with an outside provider. Patient/Guardian was advised if they have not already done so to contact the registration department to sign all necessary forms in order for [place] to release information regarding their care.   Consent: Patient/Guardian gives verbal consent for treatment and assignment of benefits for services provided during this visit. Patient/Guardian expressed understanding and agreed to proceed.   1. Severe episode of recurrent major depressive disorder, without psychotic features (HCC)  - mirtazapine (REMERON) 15 MG tablet; Take 1 tablet (15 mg total) by mouth at bedtime.  Dispense: 90 tablet; Refill: 0 - venlafaxine XR (EFFEXOR-XR) 37.5 MG 24 hr capsule; Take 1 capsule (37.5 mg total) by mouth daily with breakfast.  Dispense: 30 capsule; Refill: 1  2. PTSD (post-traumatic stress disorder)  - mirtazapine (REMERON) 15 MG tablet; Take 1 tablet (15 mg total) by mouth at bedtime.  Dispense: 90 tablet; Refill: 0 - venlafaxine XR (EFFEXOR-XR) 37.5 MG 24 hr capsule; Take 1 capsule (37.5 mg total) by mouth daily with breakfast.  Dispense: 30 capsule; Refill:  1  3. Generalized anxiety disorder  - mirtazapine (REMERON) 15 MG tablet; Take 1 tablet (15 mg total) by mouth at bedtime.  Dispense: 90 tablet; Refill: 0 - venlafaxine XR (EFFEXOR-XR) 37.5 MG 24 hr capsule; Take 1 capsule (37.5 mg total) by mouth daily with  breakfast.  Dispense: 30 capsule; Refill: 1  Patient to follow up in 6 weeks Provider spent a total of 31 minutes with the patient/reviewing patient's chart  Meta Hatchet, PA 09/28/2023, 5:33 PM

## 2023-10-02 ENCOUNTER — Ambulatory Visit (HOSPITAL_COMMUNITY): Payer: Medicaid Other | Admitting: Licensed Clinical Social Worker

## 2023-10-02 ENCOUNTER — Encounter (HOSPITAL_COMMUNITY): Payer: Self-pay

## 2023-10-03 ENCOUNTER — Encounter: Payer: Medicaid Other | Admitting: Radiology

## 2023-10-03 NOTE — Telephone Encounter (Signed)
Routing as high priority to [employer], NGYN.   Cc: Debarah Crape

## 2023-10-22 ENCOUNTER — Other Ambulatory Visit (HOSPITAL_COMMUNITY): Payer: Self-pay | Admitting: Physician Assistant

## 2023-10-22 DIAGNOSIS — F332 Major depressive disorder, recurrent severe without psychotic features: Secondary | ICD-10-CM

## 2023-10-22 DIAGNOSIS — F4310 Post-traumatic stress disorder, unspecified: Secondary | ICD-10-CM

## 2023-10-22 DIAGNOSIS — F411 Generalized anxiety disorder: Secondary | ICD-10-CM

## 2023-10-28 ENCOUNTER — Other Ambulatory Visit: Payer: Self-pay | Admitting: Family Medicine

## 2023-10-28 ENCOUNTER — Other Ambulatory Visit: Payer: Self-pay | Admitting: Neurology

## 2023-10-30 ENCOUNTER — Other Ambulatory Visit: Payer: Self-pay

## 2023-10-30 MED ORDER — AMPHETAMINE-DEXTROAMPHETAMINE 20 MG PO TABS: 20.0000 mg | ORAL_TABLET | Freq: Every day | ORAL | 0 refills | Status: DC

## 2023-10-31 ENCOUNTER — Other Ambulatory Visit (HOSPITAL_COMMUNITY): Payer: Self-pay

## 2023-10-31 ENCOUNTER — Other Ambulatory Visit: Payer: Self-pay

## 2023-10-31 NOTE — Telephone Encounter (Signed)
 Rx refilled per note from 08/28/2023, "Use of rizatriptan with benefit, will continue."

## 2023-11-01 ENCOUNTER — Other Ambulatory Visit (INDEPENDENT_AMBULATORY_CARE_PROVIDER_SITE_OTHER): Payer: Medicaid Other

## 2023-11-01 ENCOUNTER — Ambulatory Visit: Payer: Medicaid Other | Admitting: Gastroenterology

## 2023-11-01 ENCOUNTER — Telehealth: Payer: Self-pay | Admitting: Adult Health

## 2023-11-01 ENCOUNTER — Encounter: Payer: Self-pay | Admitting: Gastroenterology

## 2023-11-01 VITALS — BP 122/72 | HR 71 | Ht 66.0 in | Wt 139.0 lb

## 2023-11-01 DIAGNOSIS — R1013 Epigastric pain: Secondary | ICD-10-CM | POA: Diagnosis not present

## 2023-11-01 DIAGNOSIS — R194 Change in bowel habit: Secondary | ICD-10-CM

## 2023-11-01 DIAGNOSIS — F17210 Nicotine dependence, cigarettes, uncomplicated: Secondary | ICD-10-CM

## 2023-11-01 DIAGNOSIS — J439 Emphysema, unspecified: Secondary | ICD-10-CM | POA: Diagnosis not present

## 2023-11-01 DIAGNOSIS — K219 Gastro-esophageal reflux disease without esophagitis: Secondary | ICD-10-CM | POA: Diagnosis not present

## 2023-11-01 LAB — CBC WITH DIFFERENTIAL/PLATELET
Basophils Absolute: 0.1 10*3/uL (ref 0.0–0.1)
Basophils Relative: 0.8 % (ref 0.0–3.0)
Eosinophils Absolute: 0.1 10*3/uL (ref 0.0–0.7)
Eosinophils Relative: 0.9 % (ref 0.0–5.0)
HCT: 44.9 % (ref 36.0–46.0)
Hemoglobin: 15.1 g/dL — ABNORMAL HIGH (ref 12.0–15.0)
Lymphocytes Relative: 31.2 % (ref 12.0–46.0)
Lymphs Abs: 2.6 10*3/uL (ref 0.7–4.0)
MCHC: 33.6 g/dL (ref 30.0–36.0)
MCV: 93 fl (ref 78.0–100.0)
Monocytes Absolute: 0.8 10*3/uL (ref 0.1–1.0)
Monocytes Relative: 9.3 % (ref 3.0–12.0)
Neutro Abs: 4.8 10*3/uL (ref 1.4–7.7)
Neutrophils Relative %: 57.8 % (ref 43.0–77.0)
Platelets: 329 10*3/uL (ref 150.0–400.0)
RBC: 4.83 Mil/uL (ref 3.87–5.11)
RDW: 13 % (ref 11.5–15.5)
WBC: 8.4 10*3/uL (ref 4.0–10.5)

## 2023-11-01 LAB — COMPREHENSIVE METABOLIC PANEL
ALT: 17 U/L (ref 0–35)
AST: 20 U/L (ref 0–37)
Albumin: 4.5 g/dL (ref 3.5–5.2)
Alkaline Phosphatase: 67 U/L (ref 39–117)
BUN: 9 mg/dL (ref 6–23)
CO2: 30 meq/L (ref 19–32)
Calcium: 9.8 mg/dL (ref 8.4–10.5)
Chloride: 103 meq/L (ref 96–112)
Creatinine, Ser: 0.72 mg/dL (ref 0.40–1.20)
GFR: 100.31 mL/min (ref >60.00)
Glucose, Bld: 105 mg/dL — ABNORMAL HIGH (ref 70–99)
Potassium: 4 meq/L (ref 3.5–5.1)
Sodium: 140 meq/L (ref 135–145)
Total Bilirubin: 0.4 mg/dL (ref 0.2–1.2)
Total Protein: 7.3 g/dL (ref 6.0–8.3)

## 2023-11-01 MED ORDER — SUFLAVE 178.7 G PO SOLR: 1.0000 | Freq: Once | ORAL | 0 refills | Status: AC

## 2023-11-01 NOTE — Patient Instructions (Signed)
  You have been scheduled for an abdominal ultrasound at [facility] (1st floor of hospital) on 11/09/2023 at 8:45am. Please arrive 30 minutes prior to your appointment for registration. Make certain not to have anything to eat or drink 6 hours prior to your appointment. Should you need to reschedule your appointment, please contact radiology at [phone]. This test typically takes about 30 minutes to perform.   You will receive your bowel preparation through [facility], which ensures the lowest copay and home delivery, with outreach via text or call from an [phone] number. Please respond promptly to avoid rescheduling. If you are interested in alternative options or have any questions please contact them at [phone]  Your Provider Has Sent Your Bowel Prep Regimen To [facility] What to expect. [facility] will contact you to verify your information and collect your copay, if applicable. Enjoy the comfort of your home while we deliver your prescription to you, free of any shipping charges. Fast, FREE delivery or shipping. [facility] accepts all major insurance benefits and applies discounts & coupons  Have additional questions? [facility]'s patient care team is always here to help.  Chat: [email or web address] Call: [phone] Email: [email or web address] NCPDP: [id number] How will we contact you? Welcome Phone call  a Welcome text and a Checkout link in a text Texts you receive from [phone] Are Not Spam.   *To set up delivery, you must complete the checkout process via link or speak to one of our patient care representatives. If we are unable to reach you, your prescription may be delayed.   Your provider has requested that you go to the basement level for lab work before leaving today. Press B on the elevator. The lab is located at the first door on the left as you exit the elevator.  _______________________________________________________  If your blood  pressure at your visit was 140/90 or greater, please contact your primary care physician to follow up on this.  _______________________________________________________  If you are age 3 or older, your body mass index should be between 23-30. Your Body mass index is 22.44 kg/m. If this is out of the aforementioned range listed, please consider follow up with your Primary Care Provider.  If you are age 28 or younger, your body mass index should be between 19-25. Your Body mass index is 22.44 kg/m. If this is out of the aformentioned range listed, please consider follow up with your Primary Care Provider.   ________________________________________________________  The [facility] providers would like to encourage you to use MYCHART to communicate with providers for non-urgent requests or questions.  Due to long hold times on the telephone, sending your provider a message by [facility] may be a faster and more efficient way to get a response.  Please allow 48 business hours for a response.  Please remember that this is for non-urgent requests.  _______________________________________________________ It was a pleasure to see you today!  Thank you for trusting me with your gastrointestinal care!

## 2023-11-01 NOTE — Progress Notes (Addendum)
 [facility] Consult Note:  History: Kelly Dorsey 11/01/2023  Referring provider: Chandra Toribio POUR, MD  Reason for consult/chief complaint: Gastroesophageal Reflux (Pt states she is having some acid reflux with smelly burps, pt states she been having intermittent problems for several years ), Abdominal Pain (Pt is having epigastric pain), and Constipation (Pt states she is having constipation once she has a BM it then turns to diarrhea)   Subjective  Prior history:  At the time of primary care referral September 2024, that provider's note included the following: GI -patient still having alternating constipation and diarrhea even after taking Metamucil.  Only thing she has noticed is that she is more gassy.  Would like a referral to a gastroenterologist.  Does not remember who performed her initial colonoscopy 10 years ago, thinks it may have been [facility].  Office Was located on [address].   Depression/anxiety-patient taking Atarax  as needed.  Taking it every other day as needed.  Since starting the Atarax  and adding mirtazapine , patient has used her Klonopin  less.  Is now down to 1 tablet a day of the Klonopin  taken prior to bedtime   Discussed the use of AI scribe software for clinical note transcription with the patient, who gave verbal consent to proceed.  History of Present Illness   The patient presents with a primary complaint of persistent pain located in the upper abdomen, specifically under the rib cage. Over the past year, they have experienced significant weight fluctuations, dropping from 160 lbs to 119 lbs, and then increasing to 139 lbs. The patient attributes the recent weight gain to increased medication intake, which has improved their appetite. Previously, the smell of food would induce nausea.  Denies dysphagia or odynophagia.  Many days feels like he just has no appetite.  Has intermittent heartburn, regurgitation and belching.  The patient  also reports irregular bowel movements, alternating between constipation and diarrhea. They describe episodes of severe discomfort during the night, characterized by sweating and a sensation of their stomach moving, which culminates in diarrhea. Following these episodes, their bowel movements return to a more firm consistency, and the cycle of constipation begins anew.  The patient is a smoker and has been diagnosed with mild emphysema. They also experience acid reflux, which they believe is exacerbated by smoking. Despite these health concerns, the patient has made efforts to reduce their smoking habit.  The patient's family history is significant for various types of cancer, including esophageal, liver, ovarian, pancreatic, and stomach cancer, occurring in different family members. The patient has not observed any blood in their bowel movements. They have been on antacid medication in the past but discontinued it due to concerns about long-term use.  The patient's symptoms have been ongoing for a significant period, with the primary issue being upper abdominal pain.  She was seen at probably the [facility] 10 or more years ago and had some endoscopic testing.  Those records not available to us  today.  They also report irregular bowel habits, suggesting the possibility of multiple concurrent digestive issues.   (10 minutes late for today's visit) ROS:  Review of Systems  Constitutional:  Negative for appetite change and unexpected weight change.  HENT:  Negative for mouth sores and voice change.   Eyes:  Negative for pain and redness.  Respiratory:  Negative for cough and shortness of breath.   Cardiovascular:  Negative for chest pain and palpitations.  Genitourinary:  Negative for dysuria and hematuria.  Musculoskeletal:  Negative for arthralgias and myalgias.  Skin:  Negative for pallor and rash.  Neurological:  Negative for weakness and headaches.  Hematological:  Negative for  adenopathy.  Psychiatric/Behavioral:  The patient is nervous/anxious.      Past Medical History: Past Medical History:  Diagnosis Date   Anxiety    Complication of anesthesia    AFTER BTL IN 2006, BP BECAME VERY ELEVATED-WITH EPIDURAL DURING DELIVERY, PT STARTED VOMITING AN SHAKING AND EPIDURAL HAD TO BE STOPPED-PT STATES WITH WISDOM TEETH SHE STOPPED BREATHING AND WAS SENT FOR SLEEP STUDY WHICH WAS NEGATIVE   Depression    Fatigue    GERD (gastroesophageal reflux disease)    OCC- NO MEDS   Migraine    MIGRAINES   Mood swings    Neuropathy    Sinusitis    Thyroid  nodule    Weakness      Past Surgical History: Past Surgical History:  Procedure Laterality Date   APPENDECTOMY     BREAST BIOPSY Left 09/18/2017   us  core 10:00 FIBROEPITHELIAL LESION   BREAST BIOPSY Right 09/04/2019   affirm bx of distortion,  x marker, radial scar   BREAST EXCISIONAL BIOPSY Left 10/06/2017   lumpectomy with NP- fibroadeoma   BREAST LUMPECTOMY Right 10/03/2019   radial scar   BREAST LUMPECTOMY WITH NEEDLE LOCALIZATION Left 10/06/2017   Procedure: BREAST LUMPECTOMY WITH NEEDLE LOCALIZATION;  Surgeon: Claudene Larinda Bolder, MD;  Location: [facility];  Service: General;  Laterality: Left;   COLONOSCOPY     DILITATION & CURRETTAGE/HYSTROSCOPY WITH NOVASURE ABLATION N/A 07/17/2015   Procedure: DILATATION & CURETTAGE/HYSTEROSCOPY WITH NOVASURE ABLATION;  Surgeon: Bebe Furry, MD;  Location: [facility];  Service: Gynecology;  Laterality: N/A;   EYE SURGERY     lasik   LAPAROSCOPY ABDOMEN DIAGNOSTIC     LASIK     PARTIAL MASTECTOMY WITH NEEDLE LOCALIZATION Right 10/03/2019   Procedure: PARTIAL MASTECTOMY WITH NEEDLE LOCALIZATION;  Surgeon: Tye Millet, DO;  Location: [facility];  Service: General;  Laterality: Right;   TUBAL LIGATION     WISDOM TOOTH EXTRACTION       Family History: Family History  Problem Relation Age of Onset   Hypertension Mother    Lung cancer Mother    Hypertension  Father    Diabetes Father    Migraines Father    Heart failure Father    Migraines Brother    Breast cancer Maternal Aunt 52   Breast cancer Paternal Aunt 47   Lupus Maternal Grandmother    Arthritis Maternal Grandmother    Migraines Paternal Grandmother    Breast cancer Paternal Grandmother 12       post meno   Migraines Paternal Grandfather    Breast cancer Cousin 89   Neuropathy Neg Hx     Social History: Social History   Socioeconomic History   Marital status: Widowed    Spouse name: Not on file   Number of children: 3   Years of education: 10   Highest education level: Not on file  Occupational History   Occupation: None   Occupation: PCA  Tobacco Use   Smoking status: Every Day    Current packs/day: 0.25    Average packs/day: 0.3 packs/day for 22.0 years (5.5 ttl pk-yrs)    Types: Cigarettes   Smokeless tobacco: Never  Vaping Use   Vaping status: Never Used  Substance and Sexual Activity   Alcohol use: Not Currently    Comment: very rarely 2 to 3 times per year  Drug use: No   Sexual activity: Yes    Partners: Male    Birth control/protection: None  Other Topics Concern   Not on file  Social History Narrative   Drinks about 2 cups of caffeine drinks a day    Right handed   Lives at home with her children & fiance'   Social Drivers of Health   Financial Resource Strain: Medium Risk (08/03/2023)   Overall Financial Resource Strain (CARDIA)    Difficulty of Paying Living Expenses: Somewhat hard  Food Insecurity: Food Insecurity Present (08/03/2023)   Hunger Vital Sign    Worried About Running Out of Food in the Last Year: Sometimes true    Ran Out of Food in the Last Year: Sometimes true  Transportation Needs: No Transportation Needs (06/20/2023)   PRAPARE - Administrator, Civil Service (Medical): No    Lack of Transportation (Non-Medical): No  Physical Activity: Inactive (08/03/2023)   Exercise Vital Sign    Days of Exercise per Week:  0 days    Minutes of Exercise per Session: 0 min  Stress: Stress Concern Present (08/03/2023)   [employer] of Occupational Health - Occupational Stress Questionnaire    Feeling of Stress : Very much  Social Connections: Socially Isolated (08/03/2023)   Social Connection and Isolation Panel [NHANES]    Frequency of Communication with Friends and Family: Once a week    Frequency of Social Gatherings with Friends and Family: Once a week    Attends Religious Services: More than 4 times per year    Active Member of [employer] or Organizations: No    Attends Banker Meetings: Never    Marital Status: Widowed    Allergies: Allergies  Allergen Reactions   Other     EPIDURAL WITH DELIVERY MADE PT VERY SICK-VOMITING AND UNCONTROLLABLE SHAKING    Outpatient Meds: Current Outpatient Medications  Medication Sig Dispense Refill   amphetamine -dextroamphetamine  (ADDERALL) 20 MG tablet Take 1 tablet (20 mg total) by mouth daily. 30 tablet 0   botulinum toxin Type A  (BOTOX ) 200 units injection Provider to inject 155 units into the muscles of the head and neck every 12 weeks. Discard remainder. 1 each 3   clonazePAM  (KLONOPIN ) 0.5 MG tablet 1-3 tablets as needed at bed time for sleep and RLS 90 tablet 0   hydrOXYzine  (VISTARIL ) 25 MG capsule Take 1 capsule (25 mg total) by mouth every 8 (eight) hours as needed. 30 capsule 0   mirtazapine  (REMERON ) 15 MG tablet Take 1 tablet (15 mg total) by mouth at bedtime. 90 tablet 0   Multiple Vitamin (MULTIVITAMIN PO) Take by mouth.     PEG 3350-KCl-NaCl-NaSulf-MgSul (SUFLAVE ) 178.7 g SOLR Take 1 each by mouth once for 1 dose. 1 each 0   rizatriptan  (MAXALT -MLT) 10 MG disintegrating tablet TAKE 1 TABLET BY MOUTH AS NEEDED FOR MIGRAINE. MAY REPEAT IN 2 HOURS IF NEEDED 12 tablet 11   venlafaxine  XR (EFFEXOR -XR) 37.5 MG 24 hr capsule TAKE 1 CAPSULE BY MOUTH DAILY WITH BREAKFAST. 90 capsule 1   Vitamin D , Ergocalciferol , (DRISDOL ) 1.25 MG (50000 UNIT)  CAPS capsule Take 1 capsule (50,000 Units total) by mouth every 7 (seven) days. (Patient not taking: Reported on 11/01/2023) 5 capsule 0   No current facility-administered medications for this visit.      ___________________________________________________________________ Objective   Exam:  BP 122/72   Pulse 71   Ht 5' 6 (1.676 m)   Wt 139 lb (63 kg)  BMI 22.44 kg/m  Wt Readings from Last 3 Encounters:  11/01/23 139 lb (63 kg)  09/04/23 132 lb 1.9 oz (59.9 kg)  07/24/23 127 lb 12.8 oz (58 kg)  (Note that weight is up in recent months)  General: Well-appearing.  No dysphonia Eyes: sclera anicteric, no redness ENT: oral mucosa moist without lesions, no cervical or supraclavicular lymphadenopathy CV: Regular without appreciable murmur, no JVD, no peripheral edema Resp: clear to auscultation bilaterally, normal RR and effort noted GI: soft, epigastric tenderness, with active bowel sounds. No guarding or palpable organomegaly noted.  No bruit Skin; warm and dry, no rash or jaundice noted Neuro: awake, alert and oriented x 3. Normal gross motor function and fluent speech   Labs:  Last CBC and CMP from March 2024  Radiologic Studies:  2016 CTAP, no more recent abdominal imaging   Encounter Diagnoses  Name Primary?   Gastroesophageal reflux disease, unspecified whether esophagitis present Yes   Altered bowel habits    Epigastric pain     Assessment and Plan    Upper/epigastric abdominal Pain Epigastric pain with significant weight fluctuations over the past year. No clear etiology identified. History of smoking and family history of various cancers.  Multiple potential causes -Order basic blood work including CBC, liver function tests, and kidney function tests. -Order an ultrasound to evaluate for gallbladder disease. -Plan for upper endoscopy to further evaluate the source of the pain and irregular bowel habits.  Irregular Bowel Habits Alternating constipation and  diarrhea. No blood in stool reported.  Medications may have something to do with it. -Plan for colonoscopy to evaluate for potential causes.  The benefits and risks of the planned procedure were described in detail with the patient or (when appropriate) their health care proxy.  Risks were outlined as including, but not limited to, bleeding, infection, perforation, adverse medication reaction leading to cardiac or pulmonary decompensation, pancreatitis (if ERCP).  The limitation of incomplete mucosal visualization was also discussed.  No guarantees or warranties were given.  GERD Reports of acid reflux and previous use of an unidentified antacid medication. -Postpone changes to antacid medication until after diagnostic testing.  Smoking Current smoker with a diagnosis of mild emphysema. -Encourage continued efforts to quit smoking for multiple health reasons, including her upper digestive symptoms.   Thank you for the courtesy of this consult.  Please call me with any questions or concerns.  Victory LITTIE Brand III  CC: Referring provider noted above

## 2023-11-01 NOTE — Telephone Encounter (Signed)
 She restarted botox  on 12/14/2022 (previously on botox  with great benefit but lost insurance). Repeat injection in 03/13/2023 reported only 1 migraine per month (prior to restarting botox , having at least 16 migraines per month) showing improvement of migraines after botox . She did not have repeat injection until 08/28/2023 (6 months in between) and had return of 10-16 migraines per month likely due to prolonged period of time in between injections. Can patient be contacted to see how many migraines she has had since injection in November? Are we able to write an appeal letter or does this absolutely have to be a peer to peer telephone call?

## 2023-11-01 NOTE — Telephone Encounter (Signed)
 Submitted auth renewal to [facility] via CMM, status is pending. Key: [id number]

## 2023-11-01 NOTE — Telephone Encounter (Signed)
 Pt's Botox  was denied by [facility], stating she has not shown improvement of at least 6 migraines per month. Denial letter is stating that requesting provider must call to set up and complete peer to peer review. Sending to both Dr. Ines and Harlene as both have seen this pt for Botox . Denial letter is scanned under media.

## 2023-11-06 ENCOUNTER — Ambulatory Visit (HOSPITAL_COMMUNITY): Payer: Medicaid Other | Admitting: Licensed Clinical Social Worker

## 2023-11-09 ENCOUNTER — Other Ambulatory Visit (HOSPITAL_COMMUNITY): Payer: Self-pay

## 2023-11-09 ENCOUNTER — Other Ambulatory Visit: Payer: Self-pay

## 2023-11-09 ENCOUNTER — Encounter (HOSPITAL_COMMUNITY): Payer: Self-pay

## 2023-11-09 ENCOUNTER — Ambulatory Visit (HOSPITAL_COMMUNITY): Admission: RE | Admit: 2023-11-09 | Payer: Medicaid Other | Source: Ambulatory Visit

## 2023-11-09 NOTE — Progress Notes (Signed)
Disenrolling from program due to therapy not working.

## 2023-11-10 ENCOUNTER — Telehealth (INDEPENDENT_AMBULATORY_CARE_PROVIDER_SITE_OTHER): Payer: Medicaid Other | Admitting: Physician Assistant

## 2023-11-10 DIAGNOSIS — F332 Major depressive disorder, recurrent severe without psychotic features: Secondary | ICD-10-CM | POA: Diagnosis not present

## 2023-11-10 DIAGNOSIS — F4310 Post-traumatic stress disorder, unspecified: Secondary | ICD-10-CM | POA: Diagnosis not present

## 2023-11-10 DIAGNOSIS — F411 Generalized anxiety disorder: Secondary | ICD-10-CM

## 2023-11-11 ENCOUNTER — Encounter (HOSPITAL_COMMUNITY): Payer: Self-pay | Admitting: Physician Assistant

## 2023-11-11 MED ORDER — VENLAFAXINE HCL ER 75 MG PO CP24: 75.0000 mg | ORAL_CAPSULE | Freq: Every day | ORAL | 1 refills | Status: DC

## 2023-11-11 MED ORDER — MIRTAZAPINE 15 MG PO TABS: 15.0000 mg | ORAL_TABLET | Freq: Every day | ORAL | 0 refills | Status: DC

## 2023-11-11 NOTE — Progress Notes (Signed)
BH MD/PA/NP OP Progress Note  Virtual Visit via Video Note  I connected with Kelly Dorsey on 11/10/23 at  4:00 PM EST by a video enabled telemedicine application and verified that I am speaking with the correct person using two identifiers.  Location: Patient: Home Provider: Clinic   I discussed the limitations of evaluation and management by telemedicine and the availability of in person appointments. The patient expressed understanding and agreed to proceed.  Follow Up Instructions:  I discussed the assessment and treatment plan with the patient. The patient was provided an opportunity to ask questions and all were answered. The patient agreed with the plan and demonstrated an understanding of the instructions.   The patient was advised to call back or seek an in-person evaluation if the symptoms worsen or if the condition fails to improve as anticipated.  I provided 19 minutes of non-face-to-face time during this encounter.  Meta Hatchet, PA    11/10/2023 9:33 PM AUBERY DATE  MRN:  [id number]  Chief Complaint:  No chief complaint on file.  HPI:   Kelly Dorsey is a 48 year old female with a past psychiatric history significant for major depressive disorder (severe episode, recurrent, without psychotic features), generalized anxiety disorder, and PTSD who presents to [facility] via virtual video visit for follow-up and medication management.  Patient is currently being managed on the following psychiatric medication:   Mirtazapine 15 mg at bedtime. Venlafaxine XR (Effexor XR) 37.5 mg daily  Patient reports that when she first started venlafaxine XR, she experienced some nausea and stomach problems.  It appears that these problems have lessened some the longer she has used the medication.  Patient states that since starting venlafaxine XR, she feels more balanced.  Since starting the medication, patient reports that she  does not shut down when faced with her stressors.  She still endorses depression but states that she is able to manage her symptoms more.  She endorses depression due to stressors in her life writing from financial stressors, her son recently getting into a car wreck, and lack of transport.  Patient rates her depression as 7-8 out of 10 with 10 being most severe.  Patient endorses depressive episodes every day.  Patient endorses the following depressive symptoms: low mood, lack of motivation, decreased concentration, irritability, excessive worrying, and feelings of guilt/helplessness.  Patient also endorses anxiety and rates her anxiety a 5-6 out of 10.  Patient reports that her anxiety has not been as severe.  A PHQ-9 screen was performed and patient scoring a 23.  A GAD-7 screen was also performed the patient scoring a 19.  Patient is alert and oriented x 4, calm, cooperative, and fully engaged in conversation during the encounter.  Patient states that she is very frustrated due to her car problems which makes her migraines worse.  Although patient exhibits depression and anxiety, she presents with appropriate affect.  Patient denies suicidal or homicidal ideations.  She further denies auditory or visual hallucinations and does not appear to be responding to internal/external stimuli.  Patient reports that her sleep varies and receives between 4 to 8 hours of sleep per night.  Patient endorses decreased appetite and eats on average 1 meal per day.  Patient denies alcohol consumption or illicit drug use.  Patient endorses tobacco use and smokes on average 10 cigarettes/day.  Visit Diagnosis:    ICD-10-CM   1. Severe episode of recurrent major depressive disorder, without psychotic features (HCC)  F33.2 venlafaxine XR (EFFEXOR-XR) 75 MG 24 hr capsule    mirtazapine (REMERON) 15 MG tablet    2. PTSD (post-traumatic stress disorder)  F43.10 venlafaxine XR (EFFEXOR-XR) 75 MG 24 hr capsule    mirtazapine  (REMERON) 15 MG tablet    3. Generalized anxiety disorder  F41.1 venlafaxine XR (EFFEXOR-XR) 75 MG 24 hr capsule    mirtazapine (REMERON) 15 MG tablet      Past Psychiatric History:  Patient reports that she was given a diagnosis of unspecified bipolar disorder.  She reports that her primary care provider has currently diagnosed her with major depressive disorder, anxiety, and PTSD.   Patient denies a past history of hospitalization due to mental health.   Patient denies a past history of suicide attempts   Patient denies a past history of homicide attempts  Past Medical History:  Past Medical History:  Diagnosis Date   Anxiety    Complication of anesthesia    AFTER BTL IN 2006, BP BECAME VERY ELEVATED-WITH EPIDURAL DURING DELIVERY, PT STARTED VOMITING AN SHAKING AND EPIDURAL HAD TO BE STOPPED-PT STATES WITH WISDOM TEETH SHE STOPPED BREATHING AND WAS SENT FOR SLEEP STUDY WHICH WAS NEGATIVE   Depression    Fatigue    GERD (gastroesophageal reflux disease)    OCC- NO MEDS   Migraine    MIGRAINES   Mood swings    Neuropathy    Sinusitis    Thyroid nodule    Weakness     Past Surgical History:  Procedure Laterality Date   APPENDECTOMY     BREAST BIOPSY Left 09/18/2017   [place] core 10:00 FIBROEPITHELIAL LESION   BREAST BIOPSY Right 09/04/2019   affirm bx of distortion,  x marker, radial scar   BREAST EXCISIONAL BIOPSY Left 10/06/2017   lumpectomy with NP- fibroadeoma   BREAST LUMPECTOMY Right 10/03/2019   radial scar   BREAST LUMPECTOMY WITH NEEDLE LOCALIZATION Left 10/06/2017   Procedure: BREAST LUMPECTOMY WITH NEEDLE LOCALIZATION;  Surgeon: Nadeen Landau, MD;  Location: [facility];  Service: General;  Laterality: Left;   COLONOSCOPY     DILITATION & CURRETTAGE/HYSTROSCOPY WITH NOVASURE ABLATION N/A 07/17/2015   Procedure: DILATATION & CURETTAGE/HYSTEROSCOPY WITH NOVASURE ABLATION;  Surgeon: Chicago Bing, MD;  Location: [facility];  Service: Gynecology;  Laterality:  N/A;   EYE SURGERY     lasik   LAPAROSCOPY ABDOMEN DIAGNOSTIC     LASIK     PARTIAL MASTECTOMY WITH NEEDLE LOCALIZATION Right 10/03/2019   Procedure: PARTIAL MASTECTOMY WITH NEEDLE LOCALIZATION;  Surgeon: Sung Amabile, DO;  Location: [facility];  Service: General;  Laterality: Right;   TUBAL LIGATION     WISDOM TOOTH EXTRACTION      Family Psychiatric History:  Patient states that many of her family members cope with drug and alcohol but never talked about mental health   Family history of suicide attempt: Patient denies Family history of homicide attempt: Patient reports that her extended family members attempted homicide Family history of substance abuse: Patient reports that many of her family members abused alcohol and drugs  Family History:  Family History  Problem Relation Age of Onset   Hypertension Mother    Lung cancer Mother    Hypertension Father    Diabetes Father    Migraines Father    Heart failure Father    Migraines Brother    Breast cancer Maternal Aunt 28   Breast cancer Paternal Aunt 33   Lupus Maternal Grandmother    Arthritis Maternal Grandmother  Migraines Paternal Grandmother    Breast cancer Paternal Grandmother 33       post meno   Migraines Paternal Grandfather    Breast cancer Cousin 84   Neuropathy Neg Hx     Social History:  Social History   Socioeconomic History   Marital status: Widowed    Spouse name: Not on file   Number of children: 3   Years of education: 10   Highest education level: Not on file  Occupational History   Occupation: None   Occupation: PCA  Tobacco Use   Smoking status: Every Day    Current packs/day: 0.25    Average packs/day: 0.3 packs/day for 22.0 years (5.5 ttl pk-yrs)    Types: Cigarettes   Smokeless tobacco: Never  Vaping Use   Vaping status: Never Used  Substance and Sexual Activity   Alcohol use: Not Currently    Comment: very rarely 2 to 3 times per year   Drug use: No   Sexual activity: Yes     Partners: Male    Birth control/protection: None  Other Topics Concern   Not on file  Social History Narrative   Drinks about 2 cups of caffeine drinks a day    Right handed   Lives at home with her children & fiance'   Social Drivers of Health   Financial Resource Strain: Medium Risk (08/03/2023)   Overall Financial Resource Strain (CARDIA)    Difficulty of Paying Living Expenses: Somewhat hard  Food Insecurity: Food Insecurity Present (08/03/2023)   Hunger Vital Sign    Worried About Running Out of Food in the Last Year: Sometimes true    Ran Out of Food in the Last Year: Sometimes true  Transportation Needs: No Transportation Needs (06/20/2023)   PRAPARE - Administrator, Civil Service (Medical): No    Lack of Transportation (Non-Medical): No  Physical Activity: Inactive (08/03/2023)   Exercise Vital Sign    Days of Exercise per Week: 0 days    Minutes of Exercise per Session: 0 min  Stress: Stress Concern Present (08/03/2023)   [employer] of Occupational Health - Occupational Stress Questionnaire    Feeling of Stress : Very much  Social Connections: Socially Isolated (08/03/2023)   Social Connection and Isolation Panel [NHANES]    Frequency of Communication with Friends and Family: Once a week    Frequency of Social Gatherings with Friends and Family: Once a week    Attends Religious Services: More than 4 times per year    Active Member of [employer] or Organizations: No    Attends Banker Meetings: Never    Marital Status: Widowed    Allergies:  Allergies  Allergen Reactions   Other     EPIDURAL WITH DELIVERY MADE PT VERY SICK-VOMITING AND UNCONTROLLABLE SHAKING    Metabolic Disorder Labs: Lab Results  Component Value Date   HGBA1C 5.7 (H) 02/10/2023   No results found for: "PROLACTIN" Lab Results  Component Value Date   CHOL 166 02/10/2023   TRIG 87 02/10/2023   HDL 68 02/10/2023   CHOLHDL 2.4 02/10/2023   LDLCALC 82 02/10/2023    No results found for: "TSH"  Therapeutic Level Labs: No results found for: "LITHIUM" No results found for: "VALPROATE" No results found for: "CBMZ"  Current Medications: Current Outpatient Medications  Medication Sig Dispense Refill   amphetamine-dextroamphetamine (ADDERALL) 20 MG tablet Take 1 tablet (20 mg total) by mouth daily. 30 tablet 0   botulinum toxin  Type A (BOTOX) 200 units injection Provider to inject 155 units into the muscles of the head and neck every 12 weeks. Discard remainder. 1 each 3   clonazePAM (KLONOPIN) 0.5 MG tablet 1-3 tablets as needed at bed time for sleep and RLS 90 tablet 0   hydrOXYzine (VISTARIL) 25 MG capsule Take 1 capsule (25 mg total) by mouth every 8 (eight) hours as needed. 30 capsule 0   mirtazapine (REMERON) 15 MG tablet Take 1 tablet (15 mg total) by mouth at bedtime. 90 tablet 0   Multiple Vitamin (MULTIVITAMIN PO) Take by mouth.     rizatriptan (MAXALT-MLT) 10 MG disintegrating tablet TAKE 1 TABLET BY MOUTH AS NEEDED FOR MIGRAINE. MAY REPEAT IN 2 HOURS IF NEEDED 12 tablet 11   venlafaxine XR (EFFEXOR-XR) 75 MG 24 hr capsule Take 1 capsule (75 mg total) by mouth daily with breakfast. 30 capsule 1   Vitamin D, Ergocalciferol, (DRISDOL) 1.25 MG (50000 UNIT) CAPS capsule Take 1 capsule (50,000 Units total) by mouth every 7 (seven) days. (Patient not taking: Reported on 11/01/2023) 5 capsule 0   No current facility-administered medications for this visit.     Musculoskeletal: Strength & Muscle Tone: within normal limits Gait & Station: normal Patient leans: N/A  Psychiatric Specialty Exam: Review of Systems  Psychiatric/Behavioral:  Positive for dysphoric mood and sleep disturbance. Negative for decreased concentration, hallucinations, self-injury and suicidal ideas. The patient is nervous/anxious. The patient is not hyperactive.     There were no vitals taken for this visit.There is no height or weight on file to calculate BMI.  General  Appearance: Casual  Eye Contact:  Good  Speech:  Clear and Coherent and Normal Rate  Volume:  Normal  Mood:  Anxious and Depressed  Affect:  Congruent  Thought Process:  Coherent, Goal Directed, and Descriptions of Associations: Intact  Orientation:  Full (Time, Place, and Person)  Thought Content: WDL   Suicidal Thoughts:  No  Homicidal Thoughts:  No  Memory:  Immediate;   Good Recent;   Good Remote;   Good  Judgement:  Good  Insight:  Good  Psychomotor Activity:  Normal  Concentration:  Concentration: Good and Attention Span: Good  Recall:  Good  Fund of Knowledge: Good  Language: Good  Akathisia:  No  Handed:  Right  AIMS (if indicated): not done  Assets:  Communication Skills Desire for Improvement Housing Physical Health Social Support  ADL's:  Intact  Cognition: WNL  Sleep:  Fair   Screenings: GAD-7    Flowsheet Row Video Visit from 11/10/2023 in [facility] Video Visit from 09/28/2023 in [facility] Office Visit from 09/04/2023 in [facility] Video Visit from 08/17/2023 in [facility] Counselor from 08/03/2023 in [facility]  Total GAD-7 Score 19 15 18 20 18       PHQ2-9    Flowsheet Row Video Visit from 11/10/2023 in [facility] Video Visit from 09/28/2023 in [facility] Office Visit from 09/04/2023 in [facility] Video Visit from 08/17/2023 in [facility] Counselor from 08/03/2023 in [place]  PHQ-2 Total Score 5 6 4 6 6   PHQ-9 Total Score 23 25 20 23 23       Flowsheet Row Video Visit from 11/10/2023 in [facility] Video Visit from 09/28/2023 in [facility] Video Visit from 08/17/2023 in [facility]  C-SSRS RISK CATEGORY Low Risk Low Risk Low Risk        Assessment and Plan:   Farin L. Brame is a 48 year old female with a past psychiatric history significant for major depressive disorder (severe episode, recurrent, without psychotic features), generalized anxiety disorder, and PTSD who presents to [facility] via virtual video visit for follow-up and medication management.  Patient presents to the encounter stating that she continues to take her medications regularly.  She reports that she experienced some nausea and stomach issues while on venlafaxine but those symptoms appear to have lessened.  Since starting venlafaxine, patient reports that her depression has been more manageable and her anxiety has not been as severe.  Patient still continues to endorse depressive symptoms and anxiety and is interested in adjusting her dosage of venlafaxine.  Provider recommended increasing patient's venlafaxine from 37.5 mg to 75 mg daily for the management of her depression and anxiety.  Patient was agreeable to recommendation.  Patient's medications to be prescribed through pharmacy of choice.  Patient was previously being seen by Richardson Dopp, LCSW for therapy.  Provider to reschedule patient for therapy.  Collaboration of Care: Collaboration of Care: Medication Management AEB provider managing patient's psychiatric medication, Primary Care Provider AEB patient being seen by family medicine, Psychiatrist AEB patient being seen by mental health provider at this facility, Other provider involved in patient's care AEB patient being seen by neurology and gynecology, and Referral or follow-up with counselor/therapist AEB patient being seen by a licensed clinical social worker at this facility  Patient/Guardian was advised Release of Information must be obtained prior to any record release in order to collaborate their care with an outside provider.  Patient/Guardian was advised if they have not already done so to contact the registration department to sign all necessary forms in order for [place] to release information regarding their care.   Consent: Patient/Guardian gives verbal consent for treatment and assignment of benefits for services provided during this visit. Patient/Guardian expressed understanding and agreed to proceed.   1. Severe episode of recurrent major depressive disorder, without psychotic features (HCC)  - venlafaxine XR (EFFEXOR-XR) 75 MG 24 hr capsule; Take 1 capsule (75 mg total) by mouth daily with breakfast.  Dispense: 30 capsule; Refill: 1 - mirtazapine (REMERON) 15 MG tablet; Take 1 tablet (15 mg total) by mouth at bedtime.  Dispense: 90 tablet; Refill: 0  2. PTSD (post-traumatic stress disorder)  - venlafaxine XR (EFFEXOR-XR) 75 MG 24 hr capsule; Take 1 capsule (75 mg total) by mouth daily with breakfast.  Dispense: 30 capsule; Refill: 1 - mirtazapine (REMERON) 15 MG tablet; Take 1 tablet (15 mg total) by mouth at bedtime.  Dispense: 90 tablet; Refill: 0  3. Generalized anxiety disorder  - venlafaxine XR (EFFEXOR-XR) 75 MG 24 hr capsule; Take 1 capsule (75 mg total) by mouth daily with breakfast.  Dispense: 30 capsule; Refill: 1 - mirtazapine (REMERON) 15 MG tablet; Take 1 tablet (15 mg total) by mouth at bedtime.  Dispense: 90 tablet; Refill: 0  Patient to follow up in 6 weeks Provider spent a total of 19 minutes with the patient/reviewing patient's chart  Meta Hatchet, PA 11/11/2023, 9:33 PM

## 2023-11-20 ENCOUNTER — Ambulatory Visit: Payer: Medicaid Other | Admitting: Adult Health

## 2023-11-21 ENCOUNTER — Ambulatory Visit: Payer: Medicaid Other | Admitting: Adult Health

## 2023-12-05 ENCOUNTER — Encounter: Payer: Self-pay | Admitting: Family Medicine

## 2023-12-05 ENCOUNTER — Ambulatory Visit (INDEPENDENT_AMBULATORY_CARE_PROVIDER_SITE_OTHER): Payer: Medicaid Other | Admitting: Family Medicine

## 2023-12-05 VITALS — BP 138/89 | HR 108 | Ht 66.0 in | Wt 145.0 lb

## 2023-12-05 DIAGNOSIS — E559 Vitamin D deficiency, unspecified: Secondary | ICD-10-CM

## 2023-12-05 DIAGNOSIS — F32A Depression, unspecified: Secondary | ICD-10-CM

## 2023-12-05 DIAGNOSIS — R1013 Epigastric pain: Secondary | ICD-10-CM

## 2023-12-05 DIAGNOSIS — R5382 Chronic fatigue, unspecified: Secondary | ICD-10-CM | POA: Diagnosis not present

## 2023-12-05 DIAGNOSIS — F902 Attention-deficit hyperactivity disorder, combined type: Secondary | ICD-10-CM | POA: Diagnosis not present

## 2023-12-05 DIAGNOSIS — G2581 Restless legs syndrome: Secondary | ICD-10-CM

## 2023-12-05 DIAGNOSIS — F419 Anxiety disorder, unspecified: Secondary | ICD-10-CM

## 2023-12-05 MED ORDER — AMPHETAMINE-DEXTROAMPHETAMINE 20 MG PO TABS: 20.0000 mg | ORAL_TABLET | Freq: Every day | ORAL | 0 refills | Status: DC

## 2023-12-05 NOTE — Assessment & Plan Note (Signed)
Continue half a tablet of Klonopin nightly.  Does not need refill at this time.

## 2023-12-05 NOTE — Assessment & Plan Note (Signed)
Continue daily vitamin D 1000 IU OTC

## 2023-12-05 NOTE — Patient Instructions (Signed)
It was nice to see you today,  We addressed the following topics today: -I have sent in a refill of your Adderall. - It would be okay if you cut it in half while you are trying to increase your venlafaxine dose.  You can also stop it completely while you are trying to increase venlafaxine. - I have ordered some more blood test to see if we can further workup your fatigue.  Please schedule a appointment to have your lab work done -  Have a great day,  Frederic Jericho, MD

## 2023-12-05 NOTE — Assessment & Plan Note (Addendum)
Patient continues to be bothered by fatigue and feeling like she has muscle and joint aches.  Patient states she has been worked up by a rheumatologist in the past.  We discussed her recent ESR and CRP that were normal within the last year.  Discussed how I do not see a TSH in her chart nor do I see any autoimmune testing, although I am sure it has been done at some point past if she was referred to rheumatology.  Will get TSH and RF and ANA.  Patient has had extensive imaging and lab testing in the past due to this and her other complaints and so far has been negative

## 2023-12-05 NOTE — Progress Notes (Signed)
Established Patient Office Visit  Subjective   Patient ID: Kelly Dorsey, female    DOB: 03-13-1976  Age: 48 y.o. MRN: [id number]  Chief Complaint  Patient presents with   Medical Management of Chronic Issues    HPI  Epigastric pain-patient has seen gastroenterology.  Has a endoscopy/colonoscopy scheduled for next week.  Had a ultrasound of the abdomen scheduled but needs to reschedule because she was unable to go to the appointment.  Patient still taking half a tablet nightly of her Klonopin for restless leg.  Depression-patient taking mirtazapine 50 mg at night and Effexor 37.5 mg.  She tried the increase in Effexor to 75 mg for 1 day but felt like her heart was racing and went back down to her 37.5 dose.  Patient currently taking daily vitamin D supplement 1000 IU  Migraines-patient no longer doing Botox.  Feels like it was not helping and was affecting her vision.  Complained of dry eyes and blurriness in her vision.  When she had an eye exam her vision was intact.  Has talked to the neurologist about doing a quarterly infusion of Vyepti.  She is unsure if she wants to do this.  Feels like nothing has helped so far with her migraines.  Feels like her migraines have moved to "behind in her eyes".   The 10-year ASCVD risk score (Arnett DK, et al., 2019) is: 1.8%  Health Maintenance Due  Topic Date Due   Pneumococcal Vaccine 2-21 Years old (1 of 2 - PCV) Never done   HIV Screening  Never done   Hepatitis C Screening  Never done   DTaP/Tdap/Td (1 - Tdap) Never done   Cervical Cancer Screening (HPV/Pap Cotest)  Never done   Colonoscopy  Never done   COVID-19 Vaccine (1 - 2024-25 season) Never done      Objective:     BP 138/89   Pulse (!) 108   Ht 5\' 6"  (1.676 m)   Wt 145 lb (65.8 kg)   SpO2 100%   BMI 23.40 kg/m    Physical Exam General: Alert, oriented Pulmonary: No respiratory stress Psych: Pleasant, spontaneous speech.   No results found for any visits  on 12/05/23.      Assessment & Plan:   Attention deficit hyperactivity disorder (ADHD), combined type Assessment & Plan: Continue Adderall.  Advised her she can cut it in half and see if a half a dose will have the same effect.  Patient brought up this is a possibility when discussing increasing her Effexor dose.   Chronic fatigue Assessment & Plan: Patient continues to be bothered by fatigue and feeling like she has muscle and joint aches.  Patient states she has been worked up by a rheumatologist in the past.  We discussed her recent ESR and CRP that were normal within the last year.  Discussed how I do not see a TSH in her chart nor do I see any autoimmune testing, although I am sure it has been done at some point past if she was referred to rheumatology.  Will get TSH and RF and ANA.  Patient has had extensive imaging and lab testing in the past due to this and her other complaints and so far has been negative   Orders: -     TSH; Future -     Rheumatoid factor; Future -     ANA w/Reflex if Positive; Future  Anxiety and depression Assessment & Plan: Continues to see behavioral health.  Taking mirtazapine 15 mg and Effexor 37.5.  Recently was increased to 75 but she tried this for 1 day and did not tolerate it so now has decreased herself back down to 37.5.  Discussed possibility of holding off on her ADHD medication when she starts the Effexor 75 mg.  Her unwanted side effects were feel like her heart was racing and feeling jittery.   Dyspepsia Assessment & Plan: Has endoscopy/colonoscopy scheduled for next week.  Holding off on PPIs/H2 blocker until after endoscopy.  Symptoms still present.   RLS (restless legs syndrome) Assessment & Plan: Continue half a tablet of Klonopin nightly.  Does not need refill at this time.   Vitamin D deficiency Assessment & Plan: Continue daily vitamin D 1000 IU OTC   Other orders -     Amphetamine-Dextroamphetamine; Take 1 tablet (20 mg  total) by mouth daily.  Dispense: 30 tablet; Refill: 0 -     Amphetamine-Dextroamphetamine; Take 1 tablet (20 mg total) by mouth daily.  Dispense: 30 tablet; Refill: 0 -     Amphetamine-Dextroamphetamine; Take 1 tablet (20 mg total) by mouth daily.  Dispense: 30 tablet; Refill: 0     Return in about 3 months (around 03/03/2024) for Fatigue, headaches.    Sandre Kitty, MD

## 2023-12-05 NOTE — Assessment & Plan Note (Signed)
Continue Adderall.  Advised her she can cut it in half and see if a half a dose will have the same effect.  Patient brought up this is a possibility when discussing increasing her Effexor dose.

## 2023-12-05 NOTE — Assessment & Plan Note (Signed)
Continues to see behavioral health.  Taking mirtazapine 15 mg and Effexor 37.5.  Recently was increased to 75 but she tried this for 1 day and did not tolerate it so now has decreased herself back down to 37.5.  Discussed possibility of holding off on her ADHD medication when she starts the Effexor 75 mg.  Her unwanted side effects were feel like her heart was racing and feeling jittery.

## 2023-12-05 NOTE — Assessment & Plan Note (Signed)
Has endoscopy/colonoscopy scheduled for next week.  Holding off on PPIs/H2 blocker until after endoscopy.  Symptoms still present.

## 2023-12-07 ENCOUNTER — Other Ambulatory Visit (HOSPITAL_COMMUNITY): Payer: Self-pay | Admitting: Physician Assistant

## 2023-12-07 DIAGNOSIS — F332 Major depressive disorder, recurrent severe without psychotic features: Secondary | ICD-10-CM

## 2023-12-07 DIAGNOSIS — F411 Generalized anxiety disorder: Secondary | ICD-10-CM

## 2023-12-07 DIAGNOSIS — F4310 Post-traumatic stress disorder, unspecified: Secondary | ICD-10-CM

## 2023-12-11 ENCOUNTER — Telehealth: Payer: Self-pay

## 2023-12-11 ENCOUNTER — Encounter: Payer: Self-pay | Admitting: Gastroenterology

## 2023-12-11 ENCOUNTER — Ambulatory Visit: Payer: Medicaid Other | Admitting: Gastroenterology

## 2023-12-11 VITALS — BP 131/87 | HR 84 | Temp 98.1°F | Resp 10 | Ht 66.0 in | Wt 139.0 lb

## 2023-12-11 DIAGNOSIS — R1084 Generalized abdominal pain: Secondary | ICD-10-CM | POA: Diagnosis not present

## 2023-12-11 DIAGNOSIS — R197 Diarrhea, unspecified: Secondary | ICD-10-CM

## 2023-12-11 DIAGNOSIS — D120 Benign neoplasm of cecum: Secondary | ICD-10-CM

## 2023-12-11 DIAGNOSIS — R194 Change in bowel habit: Secondary | ICD-10-CM

## 2023-12-11 DIAGNOSIS — K219 Gastro-esophageal reflux disease without esophagitis: Secondary | ICD-10-CM

## 2023-12-11 MED ORDER — SODIUM CHLORIDE 0.9 % IV SOLN: 500.0000 mL | Freq: Once | INTRAVENOUS | Status: DC

## 2023-12-11 NOTE — Progress Notes (Signed)
 Pt's states no medical or surgical changes since previsit or office visit.

## 2023-12-11 NOTE — Telephone Encounter (Signed)
 Pt stated that she has had a migraine that started 30 min after taking the first half of bowel prep, and continued through out the night. She did take her migraine medication last night. Stated that she has been feeling fatigue, and "unwell" since starting the prep. She denies fever,chills, SOB or chest pain. She was able to drink 4-6oz of the second half of her bowel prep this morning, and continues to have the same symptoms as last night. Her last BM was liquid and dark. Spoke with MD and advised pt that is it not uncommon for pt to feel fatigue and drained from prep. Advised pt to continue drinking water until 1pm today, and to arrive as scheduled. Pt verbalized understanding and had no further concerns at the end of the call.

## 2023-12-11 NOTE — Progress Notes (Signed)
 Called to room to assist during endoscopic procedure.  Patient ID and intended procedure confirmed with present staff. Received instructions for my participation in the procedure from the performing physician.

## 2023-12-11 NOTE — Patient Instructions (Signed)
 Educational handout provided to patient related to Polyps  Resume previous diet  Continue present medications  Awaiting pathology results   YOU HAD AN ENDOSCOPIC PROCEDURE TODAY AT THE [facility]:   Refer to the procedure report that was given to you for any specific questions about what was found during the examination.  If the procedure report does not answer your questions, please call your gastroenterologist to clarify.  If you requested that your care partner not be given the details of your procedure findings, then the procedure report has been included in a sealed envelope for you to review at your convenience later.  YOU SHOULD EXPECT: Some feelings of bloating in the abdomen. Passage of more gas than usual.  Walking can help get rid of the air that was put into your GI tract during the procedure and reduce the bloating. If you had a lower endoscopy (such as a colonoscopy or flexible sigmoidoscopy) you may notice spotting of blood in your stool or on the toilet paper. If you underwent a bowel prep for your procedure, you may not have a normal bowel movement for a few days.  Please Note:  You might notice some irritation and congestion in your nose or some drainage.  This is from the oxygen used during your procedure.  There is no need for concern and it should clear up in a day or so.  SYMPTOMS TO REPORT IMMEDIATELY:  Following lower endoscopy (colonoscopy or flexible sigmoidoscopy):  Excessive amounts of blood in the stool  Significant tenderness or worsening of abdominal pains  Swelling of the abdomen that is new, acute  Fever of 100F or higher  Following upper endoscopy (EGD)  Vomiting of blood or coffee ground material  New chest pain or pain under the shoulder blades  Painful or persistently difficult swallowing  New shortness of breath  Fever of 100F or higher  Black, tarry-looking stools  For urgent or emergent issues, a gastroenterologist can be reached  at any hour by calling [phone]. Do not use MyChart messaging for urgent concerns.    DIET:  We do recommend a small meal at first, but then you may proceed to your regular diet.  Drink plenty of fluids but you should avoid alcoholic beverages for 24 hours.  ACTIVITY:  You should plan to take it easy for the rest of today and you should NOT DRIVE or use heavy machinery until tomorrow (because of the sedation medicines used during the test).    FOLLOW UP: Our staff will call the number listed on your records the next business day following your procedure.  We will call around 7:15- 8:00 am to check on you and address any questions or concerns that you may have regarding the information given to you following your procedure. If we do not reach you, we will leave a message.     If any biopsies were taken you will be contacted by phone or by letter within the next 1-3 weeks.  Please call us at [phone] if you have not heard about the biopsies in 3 weeks.    SIGNATURES/CONFIDENTIALITY: You and/or your care partner have signed paperwork which will be entered into your electronic medical record.  These signatures attest to the fact that that the information above on your After Visit Summary has been reviewed and is understood.  Full responsibility of the confidentiality of this discharge information lies with you and/or your care-partner.

## 2023-12-11 NOTE — Progress Notes (Signed)
 History and Physical:  This patient presents for endoscopic testing for: Encounter Diagnoses  Name Primary?   Gastroesophageal reflux disease, unspecified whether esophagitis present Yes   Altered bowel habits     Multiple UGI and Lower GI symptoms as outlined in 11/01/23 office consult note. No significant clinical changes since then. GB US pending  Patient is otherwise without complaints or active issues today.   Past Medical History: Past Medical History:  Diagnosis Date   Anxiety    Complication of anesthesia    AFTER BTL IN 2006, BP BECAME VERY ELEVATED-WITH EPIDURAL DURING DELIVERY, PT STARTED VOMITING AN SHAKING AND EPIDURAL HAD TO BE STOPPED-PT STATES WITH WISDOM TEETH SHE STOPPED BREATHING AND WAS SENT FOR SLEEP STUDY WHICH WAS NEGATIVE   Depression    Fatigue    GERD (gastroesophageal reflux disease)    OCC- NO MEDS   Migraine    MIGRAINES   Mood swings    Neuropathy    Sinusitis    Thyroid nodule    Weakness      Past Surgical History: Past Surgical History:  Procedure Laterality Date   APPENDECTOMY     BREAST BIOPSY Left 09/18/2017   [place] core 10:00 FIBROEPITHELIAL LESION   BREAST BIOPSY Right 09/04/2019   affirm bx of distortion,  x marker, radial scar   BREAST EXCISIONAL BIOPSY Left 10/06/2017   lumpectomy with NP- fibroadeoma   BREAST LUMPECTOMY Right 10/03/2019   radial scar   BREAST LUMPECTOMY WITH NEEDLE LOCALIZATION Left 10/06/2017   Procedure: BREAST LUMPECTOMY WITH NEEDLE LOCALIZATION;  Surgeon: Nadeen Landau, MD;  Location: [facility];  Service: General;  Laterality: Left;   COLONOSCOPY     DILITATION & CURRETTAGE/HYSTROSCOPY WITH NOVASURE ABLATION N/A 07/17/2015   Procedure: DILATATION & CURETTAGE/HYSTEROSCOPY WITH NOVASURE ABLATION;  Surgeon:  Bing, MD;  Location: [facility];  Service: Gynecology;  Laterality: N/A;   EYE SURGERY     lasik   LAPAROSCOPY ABDOMEN DIAGNOSTIC     LASIK     PARTIAL MASTECTOMY WITH NEEDLE LOCALIZATION  Right 10/03/2019   Procedure: PARTIAL MASTECTOMY WITH NEEDLE LOCALIZATION;  Surgeon: Sung Amabile, DO;  Location: [facility];  Service: General;  Laterality: Right;   TUBAL LIGATION     WISDOM TOOTH EXTRACTION      Allergies: Allergies  Allergen Reactions   Other Other (See Comments)    EPIDURAL WITH DELIVERY MADE PT VERY SICK-VOMITING AND UNCONTROLLABLE SHAKING    Outpatient Meds: Current Outpatient Medications  Medication Sig Dispense Refill   amphetamine-dextroamphetamine (ADDERALL) 20 MG tablet Take 1 tablet (20 mg total) by mouth daily. 30 tablet 0   clonazePAM (KLONOPIN) 0.5 MG tablet 1-3 tablets as needed at bed time for sleep and RLS 90 tablet 0   hydrOXYzine (VISTARIL) 25 MG capsule Take 1 capsule (25 mg total) by mouth every 8 (eight) hours as needed. 30 capsule 0   ibuprofen (ADVIL) 800 MG tablet Take 800 mg by mouth 3 (three) times daily.     mirtazapine (REMERON) 15 MG tablet Take 1 tablet (15 mg total) by mouth at bedtime. 90 tablet 0   Multiple Vitamin (MULTIVITAMIN PO) Take by mouth.     rizatriptan (MAXALT-MLT) 10 MG disintegrating tablet TAKE 1 TABLET BY MOUTH AS NEEDED FOR MIGRAINE. MAY REPEAT IN 2 HOURS IF NEEDED 12 tablet 11   venlafaxine XR (EFFEXOR-XR) 75 MG 24 hr capsule TAKE 1 CAPSULE BY MOUTH DAILY WITH BREAKFAST. 90 capsule 1   White Petrolatum-Mineral Oil (SYSTANE NIGHTTIME) OINT Apply 1 Application  to eye at bedtime.     amoxicillin (AMOXIL) 500 MG tablet Take 500 mg by mouth 4 (four) times daily. (Patient not taking: Reported on 12/11/2023)     [START ON 01/04/2024] amphetamine-dextroamphetamine (ADDERALL) 20 MG tablet Take 1 tablet (20 mg total) by mouth daily. 30 tablet 0   [START ON 02/03/2024] amphetamine-dextroamphetamine (ADDERALL) 20 MG tablet Take 1 tablet (20 mg total) by mouth daily. 30 tablet 0   Current Facility-Administered Medications  Medication Dose Route Frequency Provider Last Rate Last Admin   0.9 %  sodium chloride infusion  500 mL  Intravenous Once Sherrilyn Rist, MD          ___________________________________________________________________ Objective   Exam:  BP (!) 138/94   Pulse 84   Temp 98.1 F (36.7 C)   Ht 5\' 6"  (1.676 m)   Wt 139 lb (63 kg)   SpO2 (!) 84%   BMI 22.44 kg/m   CV: regular , S1/S2 Resp: clear to auscultation bilaterally, normal RR and effort noted GI: soft, no tenderness, with active bowel sounds.   Assessment: Encounter Diagnoses  Name Primary?   Gastroesophageal reflux disease, unspecified whether esophagitis present Yes   Altered bowel habits      Plan: Colonoscopy EGD  The benefits and risks of the planned procedure were described in detail with the patient or (when appropriate) their health care proxy.  Risks were outlined as including, but not limited to, bleeding, infection, perforation, adverse medication reaction leading to cardiac or pulmonary decompensation, pancreatitis (if ERCP).  The limitation of incomplete mucosal visualization was also discussed.  No guarantees or warranties were given.  The patient is appropriate for an endoscopic procedure in the ambulatory setting.   - Amada Jupiter, MD

## 2023-12-11 NOTE — Op Note (Signed)
 [facility] Patient Name: Kelly Dorsey Procedure Date: 12/11/2023 2:30 PM MRN: [id number] Endoscopist: Sherilyn Cooter L. Myrtie Neither , MD, [id number] Age: 48 Referring MD:  Date of Birth: 09/10/1976 Gender: Female Account #: [id number] Procedure:                Upper GI endoscopy Indications:              Generalized abdominal pain Medicines:                Monitored Anesthesia Care Procedure:                Pre-Anesthesia Assessment:                           - Prior to the procedure, a History and Physical                            was performed, and patient medications and                            allergies were reviewed. The patient's tolerance of                            previous anesthesia was also reviewed. The risks                            and benefits of the procedure and the sedation                            options and risks were discussed with the patient.                            All questions were answered, and informed consent                            was obtained. Prior Anticoagulants: The patient has                            taken no anticoagulant or antiplatelet agents. ASA                            Grade Assessment: II - A patient with mild systemic                            disease. After reviewing the risks and benefits,                            the patient was deemed in satisfactory condition to                            undergo the procedure.                           After obtaining informed consent, the endoscope was  passed under direct vision. Throughout the                            procedure, the patient's blood pressure, pulse, and                            oxygen saturations were monitored continuously. The                            Olympus Scope [id number] was introduced through the                            mouth, and advanced to the second part of duodenum.                            The upper GI endoscopy  was accomplished without                            difficulty. The patient tolerated the procedure                            well. Scope In: Scope Out: Findings:                 The esophagus was normal.                           Normal mucosa was found in the entire examined                            stomach. Biopsies were taken with a cold forceps                            for histology. (antrum and body for H pylori)                           The cardia and gastric fundus were normal on                            retroflexion.                           Normal mucosa was found in the entire duodenum.                            Biopsies for histology were taken with a cold                            forceps for evaluation of celiac disease. Complications:            No immediate complications. Estimated Blood Loss:     Estimated blood loss was minimal. Impression:               - Normal esophagus.                           -  Normal mucosa was found in the entire stomach.                            Biopsied.                           - Normal mucosa was found in the entire examined                            duodenum. Biopsied. Recommendation:           - Patient has a contact number available for                            emergencies. The signs and symptoms of potential                            delayed complications were discussed with the                            patient. Return to normal activities tomorrow.                            Written discharge instructions were provided to the                            patient.                           - Resume previous diet.                           - Continue present medications.                           - Await pathology results.                           - See the other procedure note for documentation of                            additional recommendations.                           - Await Abdominal ultrasound that has  been ordered                            (patient had to reschedule) Sherilyn Cooter L. Myrtie Neither, MD 12/11/2023 3:41:04 PM This report has been signed electronically.

## 2023-12-11 NOTE — Progress Notes (Signed)
 To pacu, VSS. Report to [email or web address].tb

## 2023-12-11 NOTE — Op Note (Signed)
 [facility] Patient Name: Kelly Dorsey Procedure Date: 12/11/2023 2:31 PM MRN: [id number] Endoscopist: Sherilyn Cooter L. Myrtie Neither , MD, [id number] Age: 48 Referring MD:  Date of Birth: Mar 02, 1976 Gender: Female Account #: [id number] Procedure:                Colonoscopy Indications:              Generalized abdominal pain, Change in bowel habits Medicines:                Monitored Anesthesia Care Procedure:                Pre-Anesthesia Assessment:                           - Prior to the procedure, a History and Physical                            was performed, and patient medications and                            allergies were reviewed. The patient's tolerance of                            previous anesthesia was also reviewed. The risks                            and benefits of the procedure and the sedation                            options and risks were discussed with the patient.                            All questions were answered, and informed consent                            was obtained. Prior Anticoagulants: The patient has                            taken no anticoagulant or antiplatelet agents. ASA                            Grade Assessment: II - A patient with mild systemic                            disease. After reviewing the risks and benefits,                            the patient was deemed in satisfactory condition to                            undergo the procedure.                           After obtaining informed consent, the colonoscope  was passed under direct vision. Throughout the                            procedure, the patient's blood pressure, pulse, and                            oxygen saturations were monitored continuously. The                            CF [id number] #[id number] was introduced through the anus                            and advanced to the the terminal ileum, with                             identification of the appendiceal orifice and IC                            valve. The colonoscopy was performed without                            difficulty. The patient tolerated the procedure                            well. The quality of the bowel preparation was good                            after lavage of opaque liquid. The terminal ileum,                            ileocecal valve, appendiceal orifice, and rectum                            were photographed. The bowel preparation used was                            SUPREP via split dose instruction. Scope In: 2:58:05 PM Scope Out: 3:18:01 PM Scope Withdrawal Time: 0 hours 15 minutes 9 seconds  Total Procedure Duration: 0 hours 19 minutes 56 seconds  Findings:                 The perianal and digital rectal examinations were                            normal.                           The terminal ileum appeared normal.                           Normal mucosa was found in the entire colon.                            Biopsies for histology were taken with a cold  forceps from the right colon and left colon for                            evaluation of microscopic colitis.                           A 12 mm polyp was found in the cecum. The polyp was                            semi-sessile. The polyp was removed with a                            piecemeal technique using a cold snare. Resection                            and retrieval were complete.                           The exam was otherwise without abnormality on                            direct and retroflexion views. Complications:            No immediate complications. Estimated Blood Loss:     Estimated blood loss was minimal. Impression:               - The examined portion of the ileum was normal.                           - Normal mucosa in the entire examined colon.                            Biopsied.                           - One 12  mm polyp in the cecum, removed piecemeal                            using a cold snare. Resected and retrieved.                           - The examination was otherwise normal on direct                            and retroflexion views.                           If upper and lower biopsies are normal, overall                            clinical picture favors IBS-A. Recommendation:           - Patient has a contact number available for  emergencies. The signs and symptoms of potential                            delayed complications were discussed with the                            patient. Return to normal activities tomorrow.                            Written discharge instructions were provided to the                            patient.                           - Resume previous diet.                           - Continue present medications.                           - Await pathology results.                           - Repeat colonoscopy is recommended for                            surveillance. The colonoscopy date will be                            determined after pathology results from today's                            exam become available for review.                           - See the other procedure note for documentation of                            additional recommendations. Kelly Dorsey L. Myrtie Neither, MD 12/11/2023 3:37:59 PM This report has been signed electronically.

## 2023-12-12 ENCOUNTER — Telehealth: Payer: Self-pay

## 2023-12-12 NOTE — Telephone Encounter (Signed)
 Follow up call to pt, lm for pt to call if having any difficulty with normal activities or eating and drinking.  Also to call if any other questions or concerns.

## 2023-12-15 ENCOUNTER — Other Ambulatory Visit: Payer: Medicaid Other

## 2023-12-15 DIAGNOSIS — R5382 Chronic fatigue, unspecified: Secondary | ICD-10-CM

## 2023-12-15 LAB — SURGICAL PATHOLOGY

## 2023-12-17 LAB — RHEUMATOID FACTOR: Rheumatoid fact SerPl-aCnc: 14.3 [IU]/mL — ABNORMAL HIGH (ref <14.0)

## 2023-12-17 LAB — ANA W/REFLEX IF POSITIVE

## 2023-12-17 LAB — TSH: TSH: 1.12 u[IU]/mL (ref 0.450–4.500)

## 2023-12-22 ENCOUNTER — Other Ambulatory Visit: Payer: Self-pay | Admitting: *Deleted

## 2023-12-22 ENCOUNTER — Telehealth: Payer: Self-pay | Admitting: Gastroenterology

## 2023-12-22 MED ORDER — DICYCLOMINE HCL 10 MG PO CAPS: 10.0000 mg | ORAL_CAPSULE | Freq: Two times a day (BID) | ORAL | 1 refills | Status: DC | PRN

## 2023-12-22 NOTE — Telephone Encounter (Signed)
 See 12/11/23 surg path result for additional information.

## 2023-12-22 NOTE — Telephone Encounter (Signed)
 Inbound call from patient returning phone call regarding recent results. Please advise, thank you.

## 2023-12-29 ENCOUNTER — Telehealth (HOSPITAL_COMMUNITY): Payer: Medicaid Other | Admitting: Physician Assistant

## 2023-12-29 ENCOUNTER — Encounter (HOSPITAL_COMMUNITY): Payer: Self-pay | Admitting: Physician Assistant

## 2023-12-29 DIAGNOSIS — F332 Major depressive disorder, recurrent severe without psychotic features: Secondary | ICD-10-CM | POA: Diagnosis not present

## 2023-12-29 DIAGNOSIS — G2581 Restless legs syndrome: Secondary | ICD-10-CM | POA: Diagnosis not present

## 2023-12-29 DIAGNOSIS — F4310 Post-traumatic stress disorder, unspecified: Secondary | ICD-10-CM | POA: Diagnosis not present

## 2023-12-29 DIAGNOSIS — F411 Generalized anxiety disorder: Secondary | ICD-10-CM | POA: Diagnosis not present

## 2023-12-29 MED ORDER — VENLAFAXINE HCL ER 75 MG PO CP24
75.0000 mg | ORAL_CAPSULE | Freq: Every day | ORAL | 1 refills | Status: DC
Start: 2023-12-29 — End: 2024-02-09

## 2023-12-29 MED ORDER — ROPINIROLE HCL 0.25 MG PO TABS: ORAL_TABLET | ORAL | 0 refills | Status: DC

## 2023-12-29 MED ORDER — MIRTAZAPINE 15 MG PO TABS: 15.0000 mg | ORAL_TABLET | Freq: Every day | ORAL | 0 refills | Status: DC
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
## 2024-01-07 ENCOUNTER — Other Ambulatory Visit: Payer: Self-pay | Admitting: Gastroenterology

## 2024-01-10 ENCOUNTER — Other Ambulatory Visit: Payer: Self-pay | Admitting: Family Medicine

## 2024-01-11 NOTE — Telephone Encounter (Signed)
 Please let the patient know that there is any refills already in for her Adderall.

## 2024-01-18 ENCOUNTER — Ambulatory Visit: Payer: Medicaid Other | Admitting: Neurology

## 2024-01-18 ENCOUNTER — Encounter: Payer: Self-pay | Admitting: Neurology

## 2024-01-18 VITALS — BP 144/91 | HR 99 | Ht 66.0 in | Wt 147.0 lb

## 2024-01-18 DIAGNOSIS — G43709 Chronic migraine without aura, not intractable, without status migrainosus: Secondary | ICD-10-CM | POA: Diagnosis not present

## 2024-01-18 MED ORDER — AJOVY 225 MG/1.5ML ~~LOC~~ SOAJ: 225.0000 mg | SUBCUTANEOUS | Status: DC

## 2024-01-18 MED ORDER — AJOVY 225 MG/1.5ML ~~LOC~~ SOAJ
225.0000 mg | SUBCUTANEOUS | 11 refills | Status: DC
Start: 2024-01-18 — End: 2024-08-05

## 2024-01-18 NOTE — Progress Notes (Signed)
 [facility]    Provider:  Dr Lucia Gaskins Referring Provider: Sandre Kitty, MD Primary Care Physician:  Sandre Kitty, MD    CC:  Migraines  01/18/2023: Patient here for chronic migraines who we have been following and treating with Botox for migraines and who we have seen in the past for multiple neurologic symptoms with extensive evaluation.  We have also done extensive testing including EMG nerve conduction study, imaging of the brain and cervical spine, blood work for other complaints including severe fatigue and weakness, chronic neck pain, numbness and tingling feeling in toes weakness proximal arms and legs, cramps, MRI of the brain was unremarkable except for mild cerebellar ectopia but not severe enough to be called a Chiari malformation, MRI spinal cervical also unremarkable, labs showed vitamin B6 and vitamin D were low and I prescribed supplements.  EMG nerve conduction study was normal.  She has seen orthopedics and rheumatology in the past as well.  Today she is here to discuss migraine management.  Baseline: Migraines are in the forehead sometimes on the left, double vision, moderate to severe in pain, positional worse when walking up, can't even more when she has then, light and sound sensitivity, nausea, vomiting, can't function. Daily headaches for > 6 months and at least 16 migraine days a month that can last 24-72 hours and daily headaches  She states botox has helped >50% in severity of migraines her remaining migraines are >50% less painful and respond better to acute medications. She has 8 migraine days a month and 16 total headache days a month, we will continue the botox and add on [name] because she was on this before and had significantly more relief with botox and [name]. Continue botox and will add [name].   Meds tried > 3 months or side effects: Rizatriptan, Depakote(made her depressed), propranolol, tramadol, cymbalta, topamax, depakote, amitriptyline,  imitrex,  imitrex and multiple others over the last 20+ years, Tylenol and various other over-the-counter analgesics, [name], Abilify, Botox greater than 3 injections, Flexeril, does imipramine, dexamethasone, diclofenac, gabapentin, ibuprofen, ketorolac injections, methylprednisolone, mirtazapine, Zofran, Phenergan, rizatriptan, Effexor which she is currently taking, zolmitriptan, Botox > 3 injections. Aimovig contraindicated due to constipation  Patient complains of symptoms per HPI as well as the following symptoms: none . Pertinent negatives and positives per HPI. All others negative  Addendum 01/12/2023: Vitamin b6 and vitamin D was low I sent in prescriptions and a note to the pcp: Sent this note to primary care: Dr. Jeannetta Nap, her vitamin D was low and so was her b6. I prescribed Foltx and voitamin D prescriptions and ask that she follows with you about it. Otherwise no idea why this patient has so many neurologic and somatic problems she may need cognitive behavioral therapy thanks emg/ncs was normal  01/05/2023 addendum: History: Patient here for EMG/NCS. 48 year old with reported severe fatigue and weakness. She states that she literally cannot funcion, has to stay in bed, can't life head, neck weakness, chronic neck pain, numbness and tingling in the fingers and toes, weakness proximally arms and legs, Numbness and tingling all throughout her body. Upper arms, lower arms, feet, hands. Feels like firwroks and cramps in the calves and feet. MRI of the brain unremarkable (mild cerebellar ectopia but not severe enough to be called a chiari malformation), MRI cervical spine also unremarkable.   I reviewed workup to date with patient and her partner. Today we are ordering labs. Repeat MRI brain stable and unremarkable; no findings  to explain symptoms. MRI cervical spine unremarkable. EMG/NCS normal.  She has had extensive lab testing in the past and seen multiple specialists, but we ordered a few more labs  today including hep B, CRP, sed rate, magnesium, vitamin D, vitamin B6, vitamin B1, MMA, B12 and folate, CMP, CBC, CK, myasthenia gravis.  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  This is not severe enough to be considered a Chiari type I malformation.  Adjacent brainstem appears normal.  The extent of cerebellar ectopia is unchanged compared to the CT scan from 06/08/2022. Parenchymal signal is normal.   No acute findings.  Normal enhancement pattern.  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  This is not severe enough to be considered a Chiari type I malformation. The spinal cord appears normal. At C5-C6, there is a left paramedian disc protrusion indenting the thecal sac without causing significant spinal stenosis.  There is no nerve root compression. At C6-C7, there is broad disc protrusion and minimal uncovertebral spurring causing mild spinal stenosis.  There is mild bilateral foraminal narrowing but no nerve root compression.  I recommended she return to primary care for further evaluation possibly cardiac or for malignancy (smoker) or other as clinically waranted by Dr. Jeannetta Nap. No neurologic cause of her symptoms were found. Discussed all of this with patient and her partner.  11/14/2022: This is a patient we have seen in the past. See saw orthopaedics, rheumatology and then here. Did extremeley well on Botox. 3 sessions and went from 16 migraine days a month hardly anything. Couldn;t continue of loss of insurance. Tried [name] which gave her muscle pain. Last time [name] was 2021. Migraines are in the forehead sometimes on the left, double vision, moderate to severe in pain, positional worse when walking  up, can't even more when she has then, light and sound sensitivity, nausea, vomiting, can't function. Daily headaches for > 6 months and at least 16 migraine days a month that can last 24-72 hours. She literally cannot funcion, has to stay in bed, can;t life head, neck weakness, chronic neck pain, numbness nd tingling in the fingers and toes, weakness proximally, weakness, weakness arms and legs, failed conservative treatment and been under the care of doctors for years, tried anti-inflammatories, muscle relaxers, PT for > 3 months, heating, stretching. She has heat and cold intolerance. Numbness and tingling all throughout her body. Upper arms, lower arms, feet, hands. Feels like firwroks and cramps in the calves and feet. Brain fog, aphasia. She lays in the bed, she can;t find her words, vision is blurry, affecteing her life can;t function can't work. No other focal neurologic deficits, associated symptoms, inciting events or modifiable factors. No aura. No medication overuse.   Patient complains of symptoms per HPI as well as the following symptoms: migraines . Pertinent negatives and positives per HPI. All others negative    Interval history April 17, 2019: This is a patient with refractory migraines, tried multiple medications including Botox.  These medications did not work and she was having at least 16 migraine days a month.  We started her [name] and she is doing extremely well.  Her migraines have significantly improved.  We did discuss her insurance  issues, apparently we did get the PA approved until February 2021 but her co-pay for 3 shots is $1000.  At this point she has been using the co-pay card about 6 months the co-pay card should last her about a year, I did go and speak with my nurse regarding this and my nurse came in and also discussed with patient.  She is moving pharmacies we recommend she bring her co-pay card or ask 1 pharmacy to transfer all the information over for her.  She is fine  with acute management.  She does not feel at this time that she needs anything else she wants to continue with A.  We discussed acute management and other options but atrophy seems to be working.  She is a current every day smoker I did discuss smoking cessation.  Interval History 11/22/2017: Say her last a year ago, tried botox but she never returned for further treatments. Her migraines are "horrible". She tried botox once but didn;t like the way it made her feel. She has 20 migraine days a month. She takes a triptan and frequently runs out. No medication overuse. She takes topiramate twice a day. It was recently increased. Her mouth is very dry. No medication overuse. Migraines can last all day, pulsating, unilateral, photo/phonophobia. Nausea. She is quite distressed.no aura.     Addendum 03/2017: Her fiance has noticed that she snores, grinds her teeth and sometimes grasps for air at night. She reports waking up every morning with a headache. [place] Spaniel that a previous doctor had recommended she have a sleep study but she never had it done. Requested sleep eval.   HPI:  Kelly Dorsey is a 48 y.o. female here as a referral from Dr. Jeannetta Nap for migraines PMHx depression, anxiety, current smoker, migraines. She has had migraines since the age of 4. They can be in the forehead and in the eyes, the main headaches are on the right side, pressure behind the eye, sensitivity to light, sound, smells, has to go into a dark room and sleep. Dark rooms make it better. She has nausea and vomits. Migraines 3-4x a week on average 16 or more a month. No aura. They can last up to 3 days on average 12-16 hours. She has nausea and vomiting. Maxalt helps. Worse with season changes, pressure outside makes it worse. She has kept multiple migraine diaries and no foods were identified. Stress and anxiety makes it worse. Can be severe but on average 10/10 in pain. It comes on quickly, not a slow build up. She takes her maxalt and  she lays down it helps but doesn't stop it. She is smoking. She is not interested in taking anything else. She has a FHx of migraines in cousins and grandmother and son.       Reviewed notes, labs and imaging from outside physicians, which showed:   Reviewed notes from [facility]. 3 migraines daily. She has left work multiple times a week. Pressure in her head daily. On cyclobenzaprine, mixed amphetamines and clonazepam. It appears they tried Depakote which made her depressed, horrible dreams like nightmares but it helped her headaches and at some point possibly they lowered the dose to 125 daily delayed release. They also tried rizatriptan. Clonazepam. Handwritten notes that I can't read. They tried propranolol.   CBC in October 26 was normal, CMP in October 2016 was unremarkable with normal creatinine 0.88 and normal BUN 8   ROS: dizziness, headache, numbness, agitation otherwise normal  Social  History   Socioeconomic History   Marital status: Widowed    Spouse name: Not on file   Number of children: 3   Years of education: 10   Highest education level: Not on file  Occupational History   Occupation: None   Occupation: PCA  Tobacco Use   Smoking status: Every Day    Current packs/day: 0.25    Average packs/day: 0.3 packs/day for 22.0 years (5.5 ttl pk-yrs)    Types: Cigarettes   Smokeless tobacco: Never  Vaping Use   Vaping status: Never Used  Substance and Sexual Activity   Alcohol use: Not Currently    Comment: very rarely 2 to 3 times per year   Drug use: No   Sexual activity: Yes    Partners: Male    Birth control/protection: None  Other Topics Concern   Not on file  Social History Narrative   Drinks about 2 cups of caffeine drinks a day    Right handed   Lives at home with her children & fiance'   Social Drivers of Health   Financial Resource Strain: Medium Risk (08/03/2023)   Overall Financial Resource Strain (CARDIA)    Difficulty of  Paying Living Expenses: Somewhat hard  Food Insecurity: Food Insecurity Present (08/03/2023)   Hunger Vital Sign    Worried About Running Out of Food in the Last Year: Sometimes true    Ran Out of Food in the Last Year: Sometimes true  Transportation Needs: No Transportation Needs (06/20/2023)   PRAPARE - Administrator, Civil Service (Medical): No    Lack of Transportation (Non-Medical): No  Physical Activity: Inactive (08/03/2023)   Exercise Vital Sign    Days of Exercise per Week: 0 days    Minutes of Exercise per Session: 0 min  Stress: Stress Concern Present (08/03/2023)   [employer] of Occupational Health - Occupational Stress Questionnaire    Feeling of Stress : Very much  Social Connections: Socially Isolated (08/03/2023)   Social Connection and Isolation Panel [NHANES]    Frequency of Communication with Friends and Family: Once a week    Frequency of Social Gatherings with Friends and Family: Once a week    Attends Religious Services: More than 4 times per year    Active Member of [employer] or Organizations: No    Attends Banker Meetings: Never    Marital Status: Widowed  Intimate Partner Violence: Not At Risk (08/03/2023)   Humiliation, Afraid, Rape, and Kick questionnaire    Fear of Current or Ex-Partner: No    Emotionally Abused: No    Physically Abused: No    Sexually Abused: No    Family History  Problem Relation Age of Onset   Hypertension Mother    Lung cancer Mother    Hypertension Father    Diabetes Father    Migraines Father    Heart failure Father    Migraines Brother    Breast cancer Maternal Aunt 52   Breast cancer Paternal Aunt 66   Esophageal cancer Paternal Uncle    Colon cancer Paternal Uncle    Lupus Maternal Grandmother    Arthritis Maternal Grandmother    Migraines Paternal Grandmother    Breast cancer Paternal Grandmother 21       post meno   Migraines Paternal Grandfather    Breast cancer Cousin 26    Neuropathy Neg Hx    Rectal cancer Neg Hx    Stomach cancer Neg Hx  Past Medical History:  Diagnosis Date   Anxiety    Complication of anesthesia    AFTER BTL IN 2006, BP BECAME VERY ELEVATED-WITH EPIDURAL DURING DELIVERY, PT STARTED VOMITING AN SHAKING AND EPIDURAL HAD TO BE STOPPED-PT STATES WITH WISDOM TEETH SHE STOPPED BREATHING AND WAS SENT FOR SLEEP STUDY WHICH WAS NEGATIVE   Depression    Fatigue    GERD (gastroesophageal reflux disease)    OCC- NO MEDS   Migraine    MIGRAINES   Mood swings    Neuropathy    Sinusitis    Thyroid nodule    Weakness     Past Surgical History:  Procedure Laterality Date   APPENDECTOMY     BREAST BIOPSY Left 09/18/2017   [place] core 10:00 FIBROEPITHELIAL LESION   BREAST BIOPSY Right 09/04/2019   affirm bx of distortion,  x marker, radial scar   BREAST EXCISIONAL BIOPSY Left 10/06/2017   lumpectomy with NP- fibroadeoma   BREAST LUMPECTOMY Right 10/03/2019   radial scar   BREAST LUMPECTOMY WITH NEEDLE LOCALIZATION Left 10/06/2017   Procedure: BREAST LUMPECTOMY WITH NEEDLE LOCALIZATION;  Surgeon: Nadeen Landau, MD;  Location: [facility];  Service: General;  Laterality: Left;   COLONOSCOPY     DILITATION & CURRETTAGE/HYSTROSCOPY WITH NOVASURE ABLATION N/A 07/17/2015   Procedure: DILATATION & CURETTAGE/HYSTEROSCOPY WITH NOVASURE ABLATION;  Surgeon: Axtell Bing, MD;  Location: [facility];  Service: Gynecology;  Laterality: N/A;   EYE SURGERY     lasik   LAPAROSCOPY ABDOMEN DIAGNOSTIC     LASIK     PARTIAL MASTECTOMY WITH NEEDLE LOCALIZATION Right 10/03/2019   Procedure: PARTIAL MASTECTOMY WITH NEEDLE LOCALIZATION;  Surgeon: Sung Amabile, DO;  Location: [facility];  Service: General;  Laterality: Right;   TUBAL LIGATION     WISDOM TOOTH EXTRACTION      Current Outpatient Medications  Medication Sig Dispense Refill   amphetamine-dextroamphetamine (ADDERALL) 20 MG tablet Take 1 tablet (20 mg total) by mouth daily. 30 tablet 0    clonazePAM (KLONOPIN) 0.5 MG tablet 1-3 tablets as needed at bed time for sleep and RLS 90 tablet 0   dicyclomine (BENTYL) 10 MG capsule TAKE 1 CAPSULE (10 MG TOTAL) BY MOUTH 2 (TWO) TIMES DAILY AS NEEDED (ABDOMINAL CRAMPING, DIARRHEA). 180 capsule 1   Fremanezumab-vfrm ([name]) 225 MG/1.5ML SOAJ Inject 225 mg into the skin every 30 (thirty) days. 1.5 mL 11   Fremanezumab-vfrm ([name]) 225 MG/1.5ML SOAJ Inject 225 mg into the skin every 30 (thirty) days.     hydrOXYzine (VISTARIL) 25 MG capsule Take 1 capsule (25 mg total) by mouth every 8 (eight) hours as needed. 30 capsule 0   ibuprofen (ADVIL) 800 MG tablet Take 800 mg by mouth 3 (three) times daily.     mirtazapine (REMERON) 15 MG tablet Take 1 tablet (15 mg total) by mouth at bedtime. 90 tablet 0   Multiple Vitamin (MULTIVITAMIN PO) Take by mouth.     rizatriptan (MAXALT-MLT) 10 MG disintegrating tablet TAKE 1 TABLET BY MOUTH AS NEEDED FOR MIGRAINE. MAY REPEAT IN 2 HOURS IF NEEDED 12 tablet 11   rOPINIRole (REQUIP) 0.25 MG tablet Take 1 tablet (0.25 mg total) by mouth at bedtime for 2 days, THEN 2 tablets (0.5 mg total) at bedtime for 28 days. 58 tablet 0   venlafaxine XR (EFFEXOR-XR) 75 MG 24 hr capsule Take 1 capsule (75 mg total) by mouth daily with breakfast. 90 capsule 1   White Petrolatum-Mineral Oil (SYSTANE NIGHTTIME) OINT Apply 1 Application to eye  at bedtime.     amoxicillin (AMOXIL) 500 MG tablet Take 500 mg by mouth 4 (four) times daily. (Patient not taking: Reported on 01/18/2024)     amphetamine-dextroamphetamine (ADDERALL) 20 MG tablet Take 1 tablet (20 mg total) by mouth daily. 30 tablet 0   [START ON 02/03/2024] amphetamine-dextroamphetamine (ADDERALL) 20 MG tablet Take 1 tablet (20 mg total) by mouth daily. (Patient not taking: Reported on 01/18/2024) 30 tablet 0   No current facility-administered medications for this visit.    Allergies as of 01/18/2024 - Review Complete 01/18/2024  Allergen Reaction Noted   Other Other (See  Comments) 07/09/2015    Vitals: BP (!) 144/91 (BP Location: Right Arm, Patient Position: Sitting, Cuff Size: Normal)   Pulse 99   Ht 5\' 6"  (1.676 m)   Wt 147 lb (66.7 kg)   BMI 23.73 kg/m  Last Weight:  Wt Readings from Last 1 Encounters:  01/18/24 147 lb (66.7 kg)   Last Height:   Ht Readings from Last 1 Encounters:  01/18/24 5\' 6"  (1.676 m)     Physical exam: Exam: Gen: Distressed over migraines                CV: RRR, no MRG. No Carotid Bruits. No peripheral edema, warm, nontender Eyes: Conjunctivae clear without exudates or hemorrhage  Neuro: Detailed Neurologic Exam  Speech:    Speech is normal; fluent and spontaneous with normal comprehension.  Cognition:    The patient is oriented to person, place, and time;     recent and remote memory intact;     language fluent;     normal attention, concentration,     fund of knowledge Cranial Nerves:    The pupils are equal, round, and reactive to light. The fundi are normal and spontaneous venous pulsations are present. Visual fields are full to finger confrontation. Extraocular movements are intact. Trigeminal sensation is intact and the muscles of mastication are normal. The face is symmetric. The palate elevates in the midline. Hearing intact. Voice is normal. Shoulder shrug is normal. The tongue has normal motion without fasciculations.   Coordination: nml  Gait: nml  Motor Observation:    No asymmetry, no atrophy, and no involuntary movements noted. Tone:    Normal muscle tone.    Posture:    Posture is normal. normal erect    Strength:    Strength is V/V in the upper and lower limbs.      Sensation: intact to LT     Reflex Exam:  DTR's:    Deep tendon reflexes in the upper and lower extremities are brisk bilaterally.   Toes:    The toes are downgoing bilaterally.   Clonus:    Clonus is present.     Assessment/Plan:  Patient here for chronic migraines who we have been following and treating with  Botox for migraines.  We have also done extensive testing including EMG nerve conduction study, imaging of the brain and cervical spine, blood work for other complaints including severe fatigue and weakness, chronic neck pain, numbness and tingling feeling in toes weakness proximal arms and legs, cramps, MRI of the brain was unremarkable except for mild cerebellar ectopia but not severe enough to be called a Chiari malformation, MRI spinal cervical also unremarkable, labs showed vitamin B6 and vitamin D were low and I prescribed supplements.  EMG nerve conduction study was normal.  She has seen orthopedics and rheumatology in the past as well.  Today she is here to  discuss migraine management.   - She has tried and failed most first line and second line agents over the last 20+ years. Restarted botox for migraines and last was January 2025.  - restart ajovy. Botox gives her >50% improvement in migraine severity and frequency but she still has > 8 migraine days a month and > 15 total headaches days a month.  Meds tried > 3 months or side effects: Rizatriptan, Depakote(made her depressed), propranolol, tramadol, cymbalta, topamax, depakote, amitriptyline, imitrex,  imitrex and multiple others over the last 20+ years, Tylenol and various other over-the-counter analgesics, [name], Abilify, Botox greater than 3 injections, Flexeril, does imipramine, dexamethasone, diclofenac, gabapentin, ibuprofen, ketorolac injections, methylprednisolone, mirtazapine, Zofran, Phenergan, rizatriptan, Effexor which she is currently taking, zolmitriptan, Botox > 3 injections. Aimovig contraindicated due to constipation  Patient com  Meds ordered this encounter  Medications   Fremanezumab-vfrm ([name]) 225 MG/1.5ML SOAJ    Sig: Inject 225 mg into the skin every 30 (thirty) days.    Dispense:  1.5 mL    Refill:  11   Fremanezumab-vfrm ([name]) 225 MG/1.5ML SOAJ    Sig: Inject 225 mg into the skin every 30 (thirty) days.    Discussed: To prevent or relieve headaches, try the following: Cool Compress. Lie down and place a cool compress on your head.  Avoid headache triggers. If certain foods or odors seem to have triggered your migraines in the past, avoid them. A headache diary might help you identify triggers.  Include physical activity in your daily routine. Try a daily walk or other moderate aerobic exercise.  Manage stress. Find healthy ways to cope with the stressors, such as delegating tasks on your to-do list.  Practice relaxation techniques. Try deep breathing, yoga, massage and visualization.  Eat regularly. Eating regularly scheduled meals and maintaining a healthy diet might help prevent headaches. Also, drink plenty of fluids.  Follow a regular sleep schedule. Sleep deprivation might contribute to headaches Consider biofeedback. With this mind-body technique, you learn to control certain bodily functions -- such as muscle tension, heart rate and blood pressure -- to prevent headaches or reduce headache pain.    Proceed to emergency room if you experience new or worsening symptoms or symptoms do not resolve, if you have new neurologic symptoms or if headache is severe, or for any concerning symptom.   Provided education and documentation from American headache Society toolbox including articles on: chronic migraine medication overuse headache, chronic migraines, prevention of migraines, behavioral and other nonpharmacologic treatments for headache.  Cc: Dr. Reita Chard, MD  [facility] [address] [place], [place] [address]  Phone [phone] Fax [phone] I spent over 105 minutes of face-to-face and non-face-to-face time with patient on the  1. Chronic migraine without aura without status migrainosus, not intractable     diagnosis.  This included previsit chart review, lab review, study review, order entry, electronic health record  documentation, patient education on the different diagnostic and therapeutic options, counseling and coordination of care, risks and benefits of management, compliance, or risk factor reduction

## 2024-01-18 NOTE — Telephone Encounter (Signed)
 The patient called back stating she never got her prescription form March and that is why it is saying it is too early for Aprils. After speaking with her pharmacy the pharmacist said he didn't see the one for March and that something was wrong with the system. He said he would get her prescription ready to pick up tomorrow. I relayed that to the patient and she was relieved and said thank you.

## 2024-01-21 ENCOUNTER — Encounter: Payer: Self-pay | Admitting: Neurology

## 2024-01-26 NOTE — Progress Notes (Deleted)
 01/26/2024 Kelly Dorsey [id number] [birth year]  Referring provider: Sandre Kitty, MD Primary GI doctor: Dr. Myrtie Neither  ASSESSMENT AND PLAN:   Assessment and Plan          Constipation  GERD 12/11/2023 EGD normal esophagus, normal stomach, normal duodenum.  Negative celiac, H. 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 female with a past medical history listed below presents for evaluation of constipation.  Last seen in the office 11/01/2023 by Dr. Myrtie Neither for GERD  Discussed the use of AI scribe software for clinical note transcription with the patient, who gave verbal consent to proceed.  History of Present Illness            She  reports that she has been smoking cigarettes. She has a 5.5 pack-year smoking history. She has never used smokeless tobacco. She reports that she does not currently use alcohol. She reports that she does not use drugs.  RELEVANT GI HISTORY, IMAGING AND LABS: Results          CBC    Component Value Date/Time   WBC 8.4 11/01/2023 1209   RBC 4.83 11/01/2023 1209   HGB 15.1 (H) 11/01/2023 1209   HGB 15.2 01/05/2023 1411   HCT 44.9 11/01/2023 1209   HCT 45.7 01/05/2023 1411   PLT 329.0 11/01/2023 1209   PLT 325 01/05/2023 1411   MCV 93.0 11/01/2023 1209   MCV 91 01/05/2023 1411   MCH 30.3 01/05/2023 1411   MCH 30.4 06/08/2022 1141   MCHC 33.6 11/01/2023 1209   RDW 13.0 11/01/2023 1209   RDW 12.6 01/05/2023 1411   LYMPHSABS 2.6 11/01/2023 1209   LYMPHSABS 1.9 01/05/2023 1411   MONOABS 0.8 11/01/2023 1209   EOSABS 0.1 11/01/2023 1209   EOSABS 0.1 01/05/2023 1411    BASOSABS 0.1 11/01/2023 1209   BASOSABS 0.1 01/05/2023 1411   Recent Labs    11/01/23 1209  HGB 15.1*    CMP     Component Value Date/Time   NA 140 11/01/2023 1209   NA 141 01/05/2023 1411   K 4.0 11/01/2023 1209   CL 103 11/01/2023 1209   CO2 30 11/01/2023 1209   GLUCOSE 105 (H) 11/01/2023 1209   BUN 9 11/01/2023 1209   BUN 9 01/05/2023 1411   CREATININE 0.72 11/01/2023 1209   CALCIUM 9.8 11/01/2023 1209   PROT 7.3 11/01/2023 1209   PROT 7.3 01/05/2023 1411   ALBUMIN 4.5 11/01/2023 1209   ALBUMIN 4.9 01/05/2023 1411   AST 20 11/01/2023 1209   ALT 17 11/01/2023 1209   ALKPHOS 67 11/01/2023 1209   BILITOT 0.4 11/01/2023 1209   BILITOT 0.4 01/05/2023 1411   GFRNONAA >60 06/08/2022 1141   GFRAA >60 08/20/2015 0005      Latest Ref Rng & Units 11/01/2023   12:09 PM 01/05/2023    2:11 PM 06/08/2022   11:41 AM  Hepatic Function  Total Protein 6.0 - 8.3 g/dL 7.3  7.3  7.7   Albumin 3.5 - 5.2 g/dL 4.5  4.9  4.6   AST 0 - 37 U/L 20  21  15    ALT 0 -  35 U/L 17  16  14    Alk Phosphatase 39 - 117 U/L 67  69  61   Total Bilirubin 0.2 - 1.2 mg/dL 0.4  0.4  0.5       Current Medications:      Current Outpatient Medications (Analgesics):    Fremanezumab-vfrm ([name]) 225 MG/1.5ML SOAJ, Inject 225 mg into the skin every 30 (thirty) days.   Fremanezumab-vfrm ([name]) 225 MG/1.5ML SOAJ, Inject 225 mg into the skin every 30 (thirty) days.   ibuprofen (ADVIL) 800 MG tablet, Take 800 mg by mouth 3 (three) times daily.   rizatriptan (MAXALT-MLT) 10 MG disintegrating tablet, TAKE 1 TABLET BY MOUTH AS NEEDED FOR MIGRAINE. MAY REPEAT IN 2 HOURS IF NEEDED   Current Outpatient Medications (Other):    amoxicillin (AMOXIL) 500 MG tablet, Take 500 mg by mouth 4 (four) times daily. (Patient not taking: Reported on 01/18/2024)   amphetamine-dextroamphetamine (ADDERALL) 20 MG tablet, Take 1 tablet (20 mg total) by mouth daily.   amphetamine-dextroamphetamine (ADDERALL) 20 MG tablet, Take 1  tablet (20 mg total) by mouth daily.   [START ON 02/03/2024] amphetamine-dextroamphetamine (ADDERALL) 20 MG tablet, Take 1 tablet (20 mg total) by mouth daily. (Patient not taking: Reported on 01/18/2024)   clonazePAM (KLONOPIN) 0.5 MG tablet, 1-3 tablets as needed at bed time for sleep and RLS   dicyclomine (BENTYL) 10 MG capsule, TAKE 1 CAPSULE (10 MG TOTAL) BY MOUTH 2 (TWO) TIMES DAILY AS NEEDED (ABDOMINAL CRAMPING, DIARRHEA).   hydrOXYzine (VISTARIL) 25 MG capsule, Take 1 capsule (25 mg total) by mouth every 8 (eight) hours as needed.   mirtazapine (REMERON) 15 MG tablet, Take 1 tablet (15 mg total) by mouth at bedtime.   Multiple Vitamin (MULTIVITAMIN PO), Take by mouth.   rOPINIRole (REQUIP) 0.25 MG tablet, Take 1 tablet (0.25 mg total) by mouth at bedtime for 2 days, THEN 2 tablets (0.5 mg total) at bedtime for 28 days.   venlafaxine XR (EFFEXOR-XR) 75 MG 24 hr capsule, Take 1 capsule (75 mg total) by mouth daily with breakfast.   White Petrolatum-Mineral Oil (SYSTANE NIGHTTIME) OINT, Apply 1 Application to eye at bedtime.  Medical History:  Past Medical History:  Diagnosis Date   Anxiety    Complication of anesthesia    AFTER BTL IN 2006, BP BECAME VERY ELEVATED-WITH EPIDURAL DURING DELIVERY, PT STARTED VOMITING AN SHAKING AND EPIDURAL HAD TO BE STOPPED-PT STATES WITH WISDOM TEETH SHE STOPPED BREATHING AND WAS SENT FOR SLEEP STUDY WHICH WAS NEGATIVE   Depression    Fatigue    GERD (gastroesophageal reflux disease)    OCC- NO MEDS   Migraine    MIGRAINES   Mood swings    Neuropathy    Sinusitis    Thyroid nodule    Weakness    Allergies:  Allergies  Allergen Reactions   Other Other (See Comments)    EPIDURAL WITH DELIVERY MADE PT VERY SICK-VOMITING AND UNCONTROLLABLE SHAKING     Surgical History:  She  has a past surgical history that includes Tubal ligation; Appendectomy; Laparoscopy abdomen diagnostic; LASIK; Colonoscopy; Dilatation & currettage/hysteroscopy with novasure  ablation (N/A, 07/17/2015); Breast lumpectomy with needle localization (Left, 10/06/2017); Wisdom tooth extraction; Eye surgery; Breast biopsy (Left, 09/18/2017); Breast excisional biopsy (Left, 10/06/2017); Breast biopsy (Right, 09/04/2019); Breast lumpectomy (Right, 10/03/2019); and Partial mastectomy with needle localization (Right, 10/03/2019). Family History:  Her family history includes Arthritis in her maternal grandmother; Breast cancer (age of onset: 45) in her cousin; Breast cancer (age of onset:  61) in her maternal aunt; Breast cancer (age of onset: 93) in her paternal aunt; Breast cancer (age of onset: 56) in her paternal grandmother; Colon cancer in her paternal uncle; Diabetes in her father; Esophageal cancer in her paternal uncle; Heart failure in her father; Hypertension in her father and mother; Lung cancer in her mother; Lupus in her maternal grandmother; Migraines in her brother, father, paternal grandfather, and paternal grandmother.  REVIEW OF SYSTEMS  : All other systems reviewed and negative except where noted in the History of Present Illness.  PHYSICAL EXAM: There were no vitals taken for this visit. Physical Exam          Doree Albee, PA-C 11:57 AM

## 2024-01-27 ENCOUNTER — Other Ambulatory Visit (HOSPITAL_COMMUNITY): Payer: Self-pay | Admitting: Physician Assistant

## 2024-01-27 DIAGNOSIS — G2581 Restless legs syndrome: Secondary | ICD-10-CM

## 2024-01-29 ENCOUNTER — Ambulatory Visit: Payer: Medicaid Other | Admitting: Physician Assistant

## 2024-02-01 ENCOUNTER — Other Ambulatory Visit: Payer: Self-pay

## 2024-02-02 ENCOUNTER — Telehealth: Payer: Self-pay | Admitting: Pharmacist

## 2024-02-02 NOTE — Telephone Encounter (Signed)
 Pharmacy Patient Advocate Encounter  Received notification from [facility] that Prior Authorization for [name] Soln Auto-Inj 225mg /1.88ml has been APPROVED from 02/02/2024 to 05/02/2024   PA #/Case ID/Reference #: [id number]

## 2024-02-02 NOTE — Telephone Encounter (Signed)
 Pharmacy Patient Advocate Encounter   Received notification from Patient Pharmacy that prior authorization for [name] (fremanezumab-vfrm) injection 225MG /1.5ML auto-injectors is required/requested.   Insurance verification completed.   The patient is insured through [facility] .   Per test claim: PA required; PA submitted to above mentioned insurance via CoverMyMeds Key/confirmation #/EOC [id number] Status is pending

## 2024-02-09 ENCOUNTER — Encounter (HOSPITAL_COMMUNITY): Payer: Self-pay | Admitting: Physician Assistant

## 2024-02-09 ENCOUNTER — Telehealth (INDEPENDENT_AMBULATORY_CARE_PROVIDER_SITE_OTHER): Admitting: Physician Assistant

## 2024-02-09 DIAGNOSIS — F411 Generalized anxiety disorder: Secondary | ICD-10-CM

## 2024-02-09 DIAGNOSIS — F4310 Post-traumatic stress disorder, unspecified: Secondary | ICD-10-CM | POA: Diagnosis not present

## 2024-02-09 DIAGNOSIS — F332 Major depressive disorder, recurrent severe without psychotic features: Secondary | ICD-10-CM | POA: Diagnosis not present

## 2024-02-09 DIAGNOSIS — Z79899 Other long term (current) drug therapy: Secondary | ICD-10-CM

## 2024-02-09 MED ORDER — MIRTAZAPINE 15 MG PO TABS: 15.0000 mg | ORAL_TABLET | Freq: Every day | ORAL | 0 refills | Status: DC

## 2024-02-09 MED ORDER — VENLAFAXINE HCL ER 75 MG PO CP24: 75.0000 mg | ORAL_CAPSULE | Freq: Every day | ORAL | 0 refills | Status: DC

## 2024-02-09 NOTE — Progress Notes (Signed)
 BH MD/PA/NP OP Progress Note  Virtual Visit via Video Note  I connected with Kelly Dorsey on 02/09/24 at  3:00 PM EDT by a video enabled telemedicine application and verified that I am speaking with the correct person using two identifiers.  Location: Patient: Home Provider: Clinic   I discussed the limitations of evaluation and management by telemedicine and the availability of in person appointments. The patient expressed understanding and agreed to proceed.  Follow Up Instructions:  I discussed the assessment and treatment plan with the patient. The patient was provided an opportunity to ask questions and all were answered. The patient agreed with the plan and demonstrated an understanding of the instructions.   The patient was advised to call back or seek an in-person evaluation if the symptoms worsen or if the condition fails to improve as anticipated.  I provided 24 minutes of non-face-to-face time during this encounter.  Gates Kasal, PA    02/09/2024 7:07 PM JAXI LIPPE  MRN:  [id number]  Chief Complaint:  Chief Complaint  Patient presents with   Follow-up   Medication Refill   HPI:   Kelly Dorsey is a 48 year old female with a past psychiatric history significant for major depressive disorder (severe episode, recurrent, without psychotic features), generalized anxiety disorder, and PTSD who presents to [facility] via virtual video visit for follow-up and medication management.  Patient is currently being managed on the following psychiatric medication:   Mirtazapine  15 mg at bedtime. Venlafaxine  XR (Effexor  XR) 75 mg daily  Patient presents to the encounter stating that she has been more depressed lately.  She rates her depression as 6 or 7 out of 10 with 10 being most severe.  Patient endorses depressive episodes every day.  Patient endorses the following depressive symptoms: feelings of sadness, fatigue,  lack of motivation, decreased concentration, irritability, feelings of worthlessness or guilt, and hopelessness.   Since being placed on ropinirole , patient reports that her depression has worsened.  She reports that she has been on ropinirole  in the past and states that it caused her to be more depressed.  In addition to ropinirole  possibly being the cause for her depression, patient reports that it has not been helpful with her restless leg syndrome.  She reports that she continues to move her legs while sleeping as well as crack her knuckles.  Patient also endorses anxiety and rates her anxiety as 5 out of 10.  Patient denies any new stressors at this time.  A PHQ 9 screen was performed with the patient scoring a 19.  A GAD-7 screen was also performed with the patient scoring a 19.  Patient is alert and oriented x 4, calm, cooperative, and fully engaged in conversation during the encounter.  Patient describes her mood as "blah."  Patient exhibits depressed mood with congruent affect.  Patient denies suicidal or homicidal ideation.  She further denies auditory or visual hallucinations and does not appear to be responding to internal/external stimuli.  Patient endorses fair sleep and receives on average 6 to 10 hours of sleep per night.  Patient endorses decreased appetite needs on average 1 meal per day.  Patient denies alcohol consumption or illicit drug use.  Patient endorses tobacco use and smokes on average 20 cigarettes/day.  Visit Diagnosis:    ICD-10-CM   1. Long-term current use of antidepressant  Z79.899 HgB A1c    Lipid panel    2. Severe episode of recurrent major depressive disorder,  without psychotic features (HCC)  F33.2 mirtazapine  (REMERON ) 15 MG tablet    venlafaxine  XR (EFFEXOR -XR) 75 MG 24 hr capsule    3. PTSD (post-traumatic stress disorder)  F43.10 mirtazapine  (REMERON ) 15 MG tablet    venlafaxine  XR (EFFEXOR -XR) 75 MG 24 hr capsule    4. Generalized anxiety disorder  F41.1  mirtazapine  (REMERON ) 15 MG tablet    venlafaxine  XR (EFFEXOR -XR) 75 MG 24 hr capsule      Past Psychiatric History:  Patient reports that she was given a diagnosis of unspecified bipolar disorder.  She reports that her primary care provider has currently diagnosed her with major depressive disorder, anxiety, and PTSD.   Patient denies a past history of hospitalization due to mental health.   Patient denies a past history of suicide attempts   Patient denies a past history of homicide attempts  Past Medical History:  Past Medical History:  Diagnosis Date   Anxiety    Complication of anesthesia    AFTER BTL IN 2006, BP BECAME VERY ELEVATED-WITH EPIDURAL DURING DELIVERY, PT STARTED VOMITING AN SHAKING AND EPIDURAL HAD TO BE STOPPED-PT STATES WITH WISDOM TEETH SHE STOPPED BREATHING AND WAS SENT FOR SLEEP STUDY WHICH WAS NEGATIVE   Depression    Fatigue    GERD (gastroesophageal reflux disease)    OCC- NO MEDS   Migraine    MIGRAINES   Mood swings    Neuropathy    Sinusitis    Thyroid  nodule    Weakness     Past Surgical History:  Procedure Laterality Date   APPENDECTOMY     BREAST BIOPSY Left 09/18/2017   us  core 10:00 FIBROEPITHELIAL LESION   BREAST BIOPSY Right 09/04/2019   affirm bx of distortion,  x marker, radial scar   BREAST EXCISIONAL BIOPSY Left 10/06/2017   lumpectomy with NP- fibroadeoma   BREAST LUMPECTOMY Right 10/03/2019   radial scar   BREAST LUMPECTOMY WITH NEEDLE LOCALIZATION Left 10/06/2017   Procedure: BREAST LUMPECTOMY WITH NEEDLE LOCALIZATION;  Surgeon: Benancio Bracket, MD;  Location: [facility] ORS;  Service: General;  Laterality: Left;   COLONOSCOPY     DILITATION & CURRETTAGE/HYSTROSCOPY WITH NOVASURE ABLATION N/A 07/17/2015   Procedure: DILATATION & CURETTAGE/HYSTEROSCOPY WITH NOVASURE ABLATION;  Surgeon: Raynell Caller, MD;  Location: [facility] OR[facility];  Service: Gynecology;  Laterality: N/A;   EYE SURGERY     lasik   LAPAROSCOPY ABDOMEN DIAGNOSTIC      LASIK     PARTIAL MASTECTOMY WITH NEEDLE LOCALIZATION Right 10/03/2019   Procedure: PARTIAL MASTECTOMY WITH NEEDLE LOCALIZATION;  Surgeon: Conrado Delay, DO;  Location: [facility] OR[facility];  Service: General;  Laterality: Right;   TUBAL LIGATION     WISDOM TOOTH EXTRACTION      Family Psychiatric History:  Patient states that many of her family members cope with drug and alcohol but never talked about mental health   Family history of suicide attempt: Patient denies Family history of homicide attempt: Patient reports that her extended family members attempted homicide Family history of substance abuse: Patient reports that many of her family members abused alcohol and drugs  Family History:  Family History  Problem Relation Age of Onset   Hypertension Mother    Lung cancer Mother    Hypertension Father    Diabetes Father    Migraines Father    Heart failure Father    Migraines Brother    Breast cancer Maternal Aunt 70   Breast cancer Paternal Aunt 13   Esophageal cancer Paternal Uncle  Colon cancer Paternal Uncle    Lupus Maternal Grandmother    Arthritis Maternal Grandmother    Migraines Paternal Grandmother    Breast cancer Paternal Grandmother 58       post meno   Migraines Paternal Grandfather    Breast cancer Cousin 53   Neuropathy Neg Hx    Rectal cancer Neg Hx    Stomach cancer Neg Hx     Social History:  Social History   Socioeconomic History   Marital status: Widowed    Spouse name: Not on file   Number of children: 3   Years of education: 10   Highest education level: Not on file  Occupational History   Occupation: None   Occupation: PCA  Tobacco Use   Smoking status: Every Day    Current packs/day: 0.25    Average packs/day: 0.3 packs/day for 22.0 years (5.5 ttl pk-yrs)    Types: Cigarettes   Smokeless tobacco: Never  Vaping Use   Vaping status: Never Used  Substance and Sexual Activity   Alcohol use: Not Currently    Comment: very rarely 2 to 3 times  per year   Drug use: No   Sexual activity: Yes    Partners: Male    Birth control/protection: None  Other Topics Concern   Not on file  Social History Narrative   Drinks about 2 cups of caffeine drinks a day    Right handed   Lives at home with her children & fiance'   Social Drivers of Health   Financial Resource Strain: Medium Risk (08/03/2023)   Overall Financial Resource Strain (CARDIA)    Difficulty of Paying Living Expenses: Somewhat hard  Food Insecurity: Food Insecurity Present (08/03/2023)   Hunger Vital Sign    Worried About Running Out of Food in the Last Year: Sometimes true    Ran Out of Food in the Last Year: Sometimes true  Transportation Needs: No Transportation Needs (06/20/2023)   PRAPARE - Administrator, Civil Service (Medical): No    Lack of Transportation (Non-Medical): No  Physical Activity: Inactive (08/03/2023)   Exercise Vital Sign    Days of Exercise per Week: 0 days    Minutes of Exercise per Session: 0 min  Stress: Stress Concern Present (08/03/2023)   [employer] of Occupational Health - Occupational Stress Questionnaire    Feeling of Stress : Very much  Social Connections: Socially Isolated (08/03/2023)   Social Connection and Isolation Panel [NHANES]    Frequency of Communication with Friends and Family: Once a week    Frequency of Social Gatherings with Friends and Family: Once a week    Attends Religious Services: More than 4 times per year    Active Member of [employer] or Organizations: No    Attends Banker Meetings: Never    Marital Status: Widowed    Allergies:  Allergies  Allergen Reactions   Other Other (See Comments)    EPIDURAL WITH DELIVERY MADE PT VERY SICK-VOMITING AND UNCONTROLLABLE SHAKING    Metabolic Disorder Labs: Lab Results  Component Value Date   HGBA1C 5.7 (H) 02/10/2023   No results found for: "PROLACTIN" Lab Results  Component Value Date   CHOL 166 02/10/2023   TRIG 87 02/10/2023    HDL 68 02/10/2023   CHOLHDL 2.4 02/10/2023   LDLCALC 82 02/10/2023   Lab Results  Component Value Date   TSH 1.120 12/15/2023    Therapeutic Level Labs: No results found for: "LITHIUM" No  results found for: "VALPROATE" No results found for: "CBMZ"  Current Medications: Current Outpatient Medications  Medication Sig Dispense Refill   amoxicillin (AMOXIL) 500 MG tablet Take 500 mg by mouth 4 (four) times daily. (Patient not taking: Reported on 01/18/2024)     amphetamine -dextroamphetamine  (ADDERALL) 20 MG tablet Take 1 tablet (20 mg total) by mouth daily. 30 tablet 0   amphetamine -dextroamphetamine  (ADDERALL) 20 MG tablet Take 1 tablet (20 mg total) by mouth daily. 30 tablet 0   amphetamine -dextroamphetamine  (ADDERALL) 20 MG tablet Take 1 tablet (20 mg total) by mouth daily. (Patient not taking: Reported on 01/18/2024) 30 tablet 0   clonazePAM  (KLONOPIN ) 0.5 MG tablet 1-3 tablets as needed at bed time for sleep and RLS 90 tablet 0   dicyclomine  (BENTYL ) 10 MG capsule TAKE 1 CAPSULE (10 MG TOTAL) BY MOUTH 2 (TWO) TIMES DAILY AS NEEDED (ABDOMINAL CRAMPING, DIARRHEA). 180 capsule 1   Fremanezumab -vfrm ([name] ) 225 MG/1.5ML SOAJ Inject 225 mg into the skin every 30 (thirty) days. 1.5 mL 11   Fremanezumab -vfrm ([name] ) 225 MG/1.5ML SOAJ Inject 225 mg into the skin every 30 (thirty) days.     hydrOXYzine  (VISTARIL ) 25 MG capsule Take 1 capsule (25 mg total) by mouth every 8 (eight) hours as needed. 30 capsule 0   ibuprofen  (ADVIL ) 800 MG tablet Take 800 mg by mouth 3 (three) times daily.     mirtazapine  (REMERON ) 15 MG tablet Take 1 tablet (15 mg total) by mouth at bedtime. 90 tablet 0   Multiple Vitamin (MULTIVITAMIN PO) Take by mouth.     rizatriptan  (MAXALT -MLT) 10 MG disintegrating tablet TAKE 1 TABLET BY MOUTH AS NEEDED FOR MIGRAINE. MAY REPEAT IN 2 HOURS IF NEEDED 12 tablet 11   rOPINIRole  (REQUIP ) 0.5 MG tablet Take 1 tablet (0.5 mg total) by mouth at bedtime. 90 tablet 0   venlafaxine   XR (EFFEXOR -XR) 75 MG 24 hr capsule Take 1 capsule (75 mg total) by mouth daily with breakfast. 90 capsule 0   White Petrolatum-Mineral Oil (SYSTANE NIGHTTIME) OINT Apply 1 Application to eye at bedtime.     No current facility-administered medications for this visit.     Musculoskeletal: Strength & Muscle Tone: within normal limits Gait & Station: normal Patient leans: N/A  Psychiatric Specialty Exam: Review of Systems  Psychiatric/Behavioral:  Positive for dysphoric mood and sleep disturbance. Negative for decreased concentration, hallucinations, self-injury and suicidal ideas. The patient is nervous/anxious. The patient is not hyperactive.     There were no vitals taken for this visit.There is no height or weight on file to calculate BMI.  General Appearance: Casual  Eye Contact:  Good  Speech:  Clear and Coherent and Normal Rate  Volume:  Normal  Mood:  Anxious and Depressed  Affect:  Congruent  Thought Process:  Coherent, Goal Directed, and Descriptions of Associations: Intact  Orientation:  Full (Time, Place, and Person)  Thought Content: WDL   Suicidal Thoughts:  No  Homicidal Thoughts:  No  Memory:  Immediate;   Good Recent;   Good Remote;   Good  Judgement:  Good  Insight:  Good  Psychomotor Activity:  Normal  Concentration:  Concentration: Good and Attention Span: Good  Recall:  Good  Fund of Knowledge: Good  Language: Good  Akathisia:  No  Handed:  Right  AIMS (if indicated): not done  Assets:  Communication Skills Desire for Improvement Housing Physical Health Social Support  ADL's:  Intact  Cognition: WNL  Sleep:  Fair   Screenings: GAD-7  Flowsheet Row Video Visit from 02/09/2024 in [facility] Video Visit from 12/29/2023 in [facility] Video Visit from 11/10/2023 in [facility] Video Visit from 09/28/2023 in [facility] Office Visit from  09/04/2023 in [facility] at [facility]  Total GAD-7 Score 19 15 19 15 18       PHQ2-9    Flowsheet Row Video Visit from 02/09/2024 in [facility] Video Visit from 12/29/2023 in [facility] Office Visit from 12/05/2023 in [facility] at [facility] Video Visit from 11/10/2023 in [facility] Video Visit from 09/28/2023 in [facility]  PHQ-2 Total Score 4 5 4 5 6   PHQ-9 Total Score 19 19 16 23 25       Flowsheet Row Video Visit from 02/09/2024 in [facility] Video Visit from 12/29/2023 in [facility] Video Visit from 11/10/2023 in [facility]  C-SSRS RISK CATEGORY No Risk Low Risk Low Risk        Assessment and Plan:   Kelly Dorsey is a 48 year old female with a past psychiatric history significant for major depressive disorder (severe episode, recurrent, without psychotic features), generalized anxiety disorder, and PTSD who presents to [facility] via virtual video visit for follow-up and medication management.  Patient presents to the encounter stating that her depression has worsened since the last encounter.  Patient believes that her worsening depression is attributed to her use of ropinirole  stating that she has been on the medication in the past and when she was on the medication in the past it caused her depression to worsen.  A PHQ-9 screen was performed with the patient scoring a 19.  Patient also endorses anxiety and rates her anxiety as 5 out of 10.  A GAD-7 screen was also performed with the patient scoring a 19.  Provider recommended adjusting patient's medications to manage her depression and anxiety, but patient refused.  Patient requested to discontinue her use of ropinirole  to see if her depression improves.   Provider instructed patient to take ropinirole  0.25 mg for 6 days before discontinuing the medication.  Patient was agreeable to recommendation.  Patient to continue taking her other medications as prescribed.  Patient's medications to be e-prescribed to pharmacy of choice.  Due to patient's use of mirtazapine , the following labs to be obtained from the patient: Lipid profile, and hemoglobin A1c.  Patient vocalized understanding.  Patient is currently being seen by a primary care provider  Collaboration of Care: Collaboration of Care: Medication Management AEB provider managing patient's psychiatric medication, Primary Care Provider AEB patient being seen by family medicine, Psychiatrist AEB patient being seen by mental health provider at this facility, Other provider involved in patient's care AEB patient being seen by neurology and gynecology, and Referral or follow-up with counselor/therapist AEB patient being seen by a licensed clinical social worker at this facility  Patient/Guardian was advised Release of Information must be obtained prior to any record release in order to collaborate their care with an outside provider. Patient/Guardian was advised if they have not already done so to contact the registration department to sign all necessary forms in order for us  to release information regarding their care.   Consent: Patient/Guardian gives verbal consent for treatment and assignment of benefits for services provided during this visit. Patient/Guardian  expressed understanding and agreed to proceed.   1. Severe episode of recurrent major depressive disorder, without psychotic features (HCC)  - mirtazapine  (REMERON ) 15 MG tablet; Take 1 tablet (15 mg total) by mouth at bedtime.  Dispense: 90 tablet; Refill: 0 - venlafaxine  XR (EFFEXOR -XR) 75 MG 24 hr capsule; Take 1 capsule (75 mg total) by mouth daily with breakfast.  Dispense: 90 capsule; Refill: 0  2. PTSD (post-traumatic stress  disorder)  - mirtazapine  (REMERON ) 15 MG tablet; Take 1 tablet (15 mg total) by mouth at bedtime.  Dispense: 90 tablet; Refill: 0 - venlafaxine  XR (EFFEXOR -XR) 75 MG 24 hr capsule; Take 1 capsule (75 mg total) by mouth daily with breakfast.  Dispense: 90 capsule; Refill: 0  3. Generalized anxiety disorder  - mirtazapine  (REMERON ) 15 MG tablet; Take 1 tablet (15 mg total) by mouth at bedtime.  Dispense: 90 tablet; Refill: 0 - venlafaxine  XR (EFFEXOR -XR) 75 MG 24 hr capsule; Take 1 capsule (75 mg total) by mouth daily with breakfast.  Dispense: 90 capsule; Refill: 0  4. Long-term current use of antidepressant (Primary)  - HgB A1c; Future - Lipid panel; Future  Patient to follow up in 3 weeks Provider spent a total of 24 minutes with the patient/reviewing patient's chart  Gates Kasal, PA 02/09/2024, 7:07 PM

## 2024-03-01 ENCOUNTER — Encounter (HOSPITAL_COMMUNITY): Payer: Self-pay

## 2024-03-01 ENCOUNTER — Telehealth (HOSPITAL_COMMUNITY): Admitting: Physician Assistant

## 2024-03-04 ENCOUNTER — Ambulatory Visit: Payer: Medicaid Other | Admitting: Family Medicine

## 2024-03-25 ENCOUNTER — Encounter: Payer: Self-pay | Admitting: Family Medicine

## 2024-03-26 ENCOUNTER — Other Ambulatory Visit: Payer: Self-pay | Admitting: Family Medicine

## 2024-03-26 MED ORDER — AMPHETAMINE-DEXTROAMPHETAMINE 20 MG PO TABS: 20.0000 mg | ORAL_TABLET | Freq: Every day | ORAL | 0 refills | Status: DC

## 2024-05-10 ENCOUNTER — Other Ambulatory Visit: Payer: Self-pay | Admitting: Family Medicine

## 2024-05-10 ENCOUNTER — Encounter: Payer: Self-pay | Admitting: Family Medicine

## 2024-05-10 MED ORDER — AMPHETAMINE-DEXTROAMPHETAMINE 20 MG PO TABS: 20.0000 mg | ORAL_TABLET | Freq: Every day | ORAL | 0 refills | Status: DC

## 2024-05-13 ENCOUNTER — Ambulatory Visit (INDEPENDENT_AMBULATORY_CARE_PROVIDER_SITE_OTHER): Admitting: Family Medicine

## 2024-05-13 ENCOUNTER — Encounter: Payer: Self-pay | Admitting: Family Medicine

## 2024-05-13 VITALS — BP 114/81 | HR 83 | Ht 66.0 in | Wt 146.8 lb

## 2024-05-13 DIAGNOSIS — Z79899 Other long term (current) drug therapy: Secondary | ICD-10-CM

## 2024-05-13 DIAGNOSIS — G43709 Chronic migraine without aura, not intractable, without status migrainosus: Secondary | ICD-10-CM | POA: Diagnosis not present

## 2024-05-13 DIAGNOSIS — F332 Major depressive disorder, recurrent severe without psychotic features: Secondary | ICD-10-CM

## 2024-05-13 DIAGNOSIS — G2581 Restless legs syndrome: Secondary | ICD-10-CM | POA: Diagnosis not present

## 2024-05-13 DIAGNOSIS — R5382 Chronic fatigue, unspecified: Secondary | ICD-10-CM

## 2024-05-13 NOTE — Assessment & Plan Note (Signed)
 Reports ongoing severe symptoms despite trials of ropinirole  (stopped due to mood effects) and gabapentin  (stopped due to cognitive side effects). Clonazepam  was effective but stopped due to long-term risks. - Will provide prescriptions for OTC Vitamin D  and Vitamin B6. Counseled that while evidence is mixed, ensuring levels are normal is low-risk and rules out deficiency as a contributing factor.

## 2024-05-13 NOTE — Progress Notes (Signed)
 Established Patient Office Visit  Subjective   Patient ID: Kelly Dorsey, female    DOB: 28-Mar-1976  Age: 48 y.o. MRN: [id number]  Chief Complaint  Patient presents with   Fatigue    HPI  Subjective - Follow-up for fatigue. No new issues since the last visit in February. - Reports ongoing, significant fatigue and lack of motivation.  - Has tried numerous medications for depression without significant benefit. - Expressed frustration with trying multiple medications and repeating the same treatment cycles without improvement. Does not want to continue trying different medications. - Migraines continue, occurring 10-15 times per month. - Restless leg syndrome symptoms are severe, requiring getting up to walk it off or take a shower. Showers can induce a feeling of faintness and overheating. - Reports issues with temperature regulation, feeling flushed and sweating with minimal exertion like sweeping. - Notes paresthesias (tingling) in hands and feet, described as electrical shocks, which they believe started after taking [name]  in 2019. - Reports a history of a tick bite in the past with a subsequent red line. Was evaluated at the time but not for Lyme disease.  Medications Current medications are mirtazapine  and venlafaxine , doses have been stable for a while. Discontinued ropinirole , which was causing negative mood effects. Discontinued gabapentin  due to cognitive side effects (brain fog), including forgetting to pay the mortgage. Discontinued clonazepam  due to concerns about long-term side effects, specifically the risk of early dementia. The neurologist recommended discontinuing [name]  and suggested trying Botox  again, but has declined to restart previously tried therapies. A refill for Adderall will be sent when due.  PMH, PSH, FH, Social Hx PMH: Treatment-resistant depression, restless leg syndrome, migraines, history of tick bite. Evaluated by cardiology, neurology, and  orthopedics without a clear diagnosis for symptoms. EGD was unremarkable. FH: Mother is showing signs of dementia in her 55s. History of grandmother with dementia. Social Hx: Had difficulty participating in a daily morning online group therapy session due to being a non-morning person and previously caring for their grandmother. Currently sees one provider for medication management and another for therapy.  ROS Neurologic: Positive for fatigue, restless legs, migraines, and paresthesias in hands and feet. Denies metal implants. Constitutional: Positive for fatigue, poor temperature regulation, flushing, and sweating with minimal exertion. Psychiatric: Positive for depressed mood and lack of motivation.  Objective General: Appears fatigued.   The 10-year ASCVD risk score (Arnett DK, et al., 2019) is: 1.2%  Health Maintenance Due  Topic Date Due   COVID-19 Vaccine (1) Never done   HIV Screening  Never done   Hepatitis C Screening  Never done   DTaP/Tdap/Td (1 - Tdap) Never done   Pneumococcal Vaccine 40-20 Years old (1 of 2 - PCV) Never done   Hepatitis B Vaccines (1 of 3 - 19+ 3-dose series) Never done   Cervical Cancer Screening (HPV/Pap Cotest)  Never done      Objective:     BP 114/81   Pulse 83   Ht 5' 6 (1.676 m)   Wt 146 lb 12.8 oz (66.6 kg)   SpO2 99%   BMI 23.69 kg/m    Physical Exam Gen: alert, oriented Pulm: no resp distress Psych: blunted affect.    No results found for any visits on 05/13/24.      Assessment & Plan:   Severe episode of recurrent major depressive disorder, without psychotic features [facility]) Assessment & Plan: Presents with long-standing, severe fatigue and depressive symptoms that have been refractory to multiple medication  trials, including mirtazapine , venlafaxine , and others like Abilify. Symptoms significantly impact daily functioning and motivation. Has seen multiple specialists without a clear etiology found. - Discussed  non-pharmacologic treatment options for treatment-resistant depression, including electroconvulsive therapy (ECT) and transcranial magnetic stimulation (TMS).   - advised pt to message to their psychiatrist, Dr. Gwenn, regarding a referral for TMS evaluation. - Will provide a referral to a specialist who offers TMS if their current provider does not. - Continue current mirtazapine  and venlafaxine . No changes to psychiatric medications at this time. - Refill Adderall as needed.   Chronic fatigue -     Tickborne Disease Antibody Profile, Serum -     Cortisol  RLS (restless legs syndrome) Assessment & Plan: Reports ongoing severe symptoms despite trials of ropinirole  (stopped due to mood effects) and gabapentin  (stopped due to cognitive side effects). Clonazepam  was effective but stopped due to long-term risks. - Will provide prescriptions for OTC Vitamin D  and Vitamin B6. Counseled that while evidence is mixed, ensuring levels are normal is low-risk and rules out deficiency as a contributing factor.   Chronic migraine without aura without status migrainosus, not intractable Assessment & Plan: 10-15 migraines per month. Neurologist recommended restarting Botox , but has declined. Insurance not covering ajovy  - No changes to migraine management plan at this time.   Long-term current use of antidepressant -     Lipid panel -     Hemoglobin A1c     Return in about 3 months (around 08/13/2024) for Fatigue, ADHD.    [name], MD

## 2024-05-13 NOTE — Patient Instructions (Signed)
 It was nice to see you today,  We addressed the following topics today: -The treatment options I mentioned for treatment resistant depression are electroconvulsive therapy and transcranial magnetic stimulation.  Your psychiatry office may or may not offer this but if they do not someone else does in [place] and I can always refer you to them - I am ordering some other lab tests to rule out unusual causes of chronic fatigue - No changes to your medications today  Have a great day,  Rolan Slain, MD

## 2024-05-13 NOTE — Assessment & Plan Note (Signed)
 10-15 migraines per month. Neurologist recommended restarting Botox , but has declined. Insurance not covering ajovy  - No changes to migraine management plan at this time.

## 2024-05-13 NOTE — Assessment & Plan Note (Addendum)
 Presents with long-standing, severe fatigue and depressive symptoms that have been refractory to multiple medication trials, including mirtazapine , venlafaxine , and others like Abilify. Symptoms significantly impact daily functioning and motivation. Has seen multiple specialists without a clear etiology found. - Discussed non-pharmacologic treatment options for treatment-resistant depression, including electroconvulsive therapy (ECT) and transcranial magnetic stimulation (TMS).   - advised pt to message to their psychiatrist, Dr. Gwenn, regarding a referral for TMS evaluation. - Will provide a referral to a specialist who offers TMS if their current provider does not. - Continue current mirtazapine  and venlafaxine . No changes to psychiatric medications at this time. - Refill Adderall as needed.

## 2024-05-15 ENCOUNTER — Ambulatory Visit: Payer: Self-pay | Admitting: Family Medicine

## 2024-05-15 LAB — TICKBORNE DISEASE ANTIBODY PROFILE, SERUM
Babesia microti IgG: <1:10 {titer}
Lyme Total Antibody EIA: NEGATIVE

## 2024-05-15 LAB — LIPID PANEL
Chol/HDL Ratio: 3.8 ratio (ref 0.0–4.4)
Cholesterol, Total: 163 mg/dL (ref 100–199)
HDL: 43 mg/dL (ref >39)
LDL Chol Calc (NIH): 85 mg/dL (ref 0–99)
Triglycerides: 210 mg/dL — ABNORMAL HIGH (ref 0–149)
VLDL Cholesterol Cal: 35 mg/dL (ref 5–40)

## 2024-05-15 LAB — CORTISOL: Cortisol: 17.1 ug/dL (ref 6.2–19.4)

## 2024-05-27 ENCOUNTER — Ambulatory Visit: Admitting: Family Medicine

## 2024-06-03 ENCOUNTER — Ambulatory Visit (INDEPENDENT_AMBULATORY_CARE_PROVIDER_SITE_OTHER): Admitting: Family Medicine

## 2024-06-03 ENCOUNTER — Encounter: Payer: Self-pay | Admitting: Family Medicine

## 2024-06-03 VITALS — BP 133/89 | HR 104 | Ht 66.0 in | Wt 144.0 lb

## 2024-06-03 DIAGNOSIS — Z131 Encounter for screening for diabetes mellitus: Secondary | ICD-10-CM

## 2024-06-03 DIAGNOSIS — R1013 Epigastric pain: Secondary | ICD-10-CM | POA: Diagnosis not present

## 2024-06-03 DIAGNOSIS — L304 Erythema intertrigo: Secondary | ICD-10-CM

## 2024-06-03 MED ORDER — NYSTATIN 100000 UNIT/GM EX CREA: 1.0000 | TOPICAL_CREAM | Freq: Two times a day (BID) | CUTANEOUS | 1 refills | Status: AC

## 2024-06-03 NOTE — Progress Notes (Signed)
 Acute Office Visit  Subjective:     Patient ID: Kelly Dorsey, female    DOB: 1976/04/21, 48 y.o.   MRN: [id number]  Chief Complaint  Patient presents with   Back Pain    HPI Patient is in today for   Subjective - Intermittent abdominal pain for a while, comes in waves. Current episode started approximately 1-2 weeks ago. Associated with tachycardia, dyspnea, and nausea without vomiting. Also reports feeling constantly full, blurry vision, dry mouth, and dizziness. Bowel habits alternate between constipation and diarrhea, but more constipated lately with last bowel movement this morning. Reports a history of slipped disc at L4-5. - Reports urinary incontinence. Had a slight urinary odor recently but denies dysuria. - Irritated rash under the left breast, concerned about infection.  Medications Dicyclomine  was previously prescribed but discontinued as it was not helping symptoms. Currently uses Tylenol , antacids, Gas-X, and a heated rice pack for pain.  PMH, PSH, FH, Social Hx PMH: History of slipped disc L4-5. Denies heavy alcohol use as it worsens symptoms. Family history is positive for cholecystectomy in female relatives. PSH: Endometrial ablation in approximately 2015, complicated by a uterine infection requiring hospitalization. EGD in February was normal. Colonoscopy was also performed. Reports history of being evaluated for endometriosis when younger. Has ovaries. Social Hx: Denies recent heavy alcohol use.  ROS Constitutional: Denies fever. Reports feeling full. HEENT: Reports blurry vision and dry mouth. Cardiovascular: Reports racing heart. Respiratory: Reports trouble breathing. GI: Reports nausea, abdominal pain, bloating, alternating constipation and diarrhea. Denies vomiting. GU: Reports urinary incontinence. Denies dysuria.  ROS      Objective:    BP 133/89   Pulse (!) 104   Ht 5' 6 (1.676 m)   Wt 144 lb (65.3 kg)   SpO2 99%   BMI 23.24 kg/m     Physical Exam General: Appears in mild distress. GI: Tenderness to palpation in the upper abdomen, bilaterally. Describes a sensation of pressure and fullness on palpation. SKIN: Rash noted in the left inframammary fold, consistent with intertrigo.  No results found for any visits on 06/03/24.      Assessment & Plan:   Epigastric pain Assessment & Plan: - Presents with intermittent, wavelike abdominal pain for several weeks, with the current episode lasting 1-2 weeks. Associated with tachycardia, dyspnea, nausea, bloating, and early satiety. Bowel habits alternate between constipation and diarrhea. EGD in February was normal. An ultrasound of the gallbladder was ordered by GI but not completed. Examination reveals tenderness to palpation in the upper abdomen. - Re-engage with gastroenterology to reschedule gallbladder ultrasound. - Order CT of the abdomen and pelvis. Patient will be called to schedule. - Check labs including lipase, A1C, and cholesterol panel. Patient to return for blood draw.  Orders: -     Lipid panel; Future -     Lipase; Future -     CT ABDOMEN PELVIS W CONTRAST; Future  Screening for diabetes mellitus -     Hemoglobin A1c; Future  Intertrigo Assessment & Plan: - Reports an irritated rash under the left breast. Examination reveals erythema in the left inframammary fold. - Prescribed an antifungal cream to be applied twice daily until rash resolves, plus an additional 7 days. - Advised on the use of a barrier cream like Desitin over the antifungal cream for additional protection. - Prescription sent to [facility] on [address].   Other orders -     Nystatin ; Apply 1 Application topically 2 (two) times daily. Apply to rash 4  times daily for 2 weeks.  Dispense: 30 g; Refill: 1     Return in about 2 months (around 08/03/2024) for f/u chronic issues.  [name], MD

## 2024-06-03 NOTE — Patient Instructions (Signed)
 It was nice to see you today,  We addressed the following topics today: -I am sending in a cream to use under your breast.  Use it twice a day until the rash is gone and then continue using it for an additional 7 days. - I am ordering a CT scan.  Someone will call you to schedule this - I have ordered some blood test.  You can come in later this week to have these done.   Have a great day,  Rolan Slain, MD

## 2024-06-04 ENCOUNTER — Other Ambulatory Visit

## 2024-06-04 DIAGNOSIS — R1013 Epigastric pain: Secondary | ICD-10-CM

## 2024-06-04 DIAGNOSIS — Z131 Encounter for screening for diabetes mellitus: Secondary | ICD-10-CM

## 2024-06-05 ENCOUNTER — Ambulatory Visit: Payer: Self-pay | Admitting: Family Medicine

## 2024-06-05 DIAGNOSIS — R1013 Epigastric pain: Secondary | ICD-10-CM | POA: Insufficient documentation

## 2024-06-05 DIAGNOSIS — L304 Erythema intertrigo: Secondary | ICD-10-CM | POA: Insufficient documentation

## 2024-06-05 LAB — HEMOGLOBIN A1C
Est. average glucose Bld gHb Est-mCnc: 111 mg/dL
Hgb A1c MFr Bld: 5.5 % (ref 4.8–5.6)

## 2024-06-05 LAB — LIPID PANEL
Chol/HDL Ratio: 3.3 ratio (ref 0.0–4.4)
Cholesterol, Total: 160 mg/dL (ref 100–199)
HDL: 48 mg/dL (ref >39)
LDL Chol Calc (NIH): 92 mg/dL (ref 0–99)
Triglycerides: 111 mg/dL (ref 0–149)
VLDL Cholesterol Cal: 20 mg/dL (ref 5–40)

## 2024-06-05 LAB — LIPASE: Lipase: 28 U/L (ref 14–72)

## 2024-06-05 NOTE — Assessment & Plan Note (Signed)
-   Reports an irritated rash under the left breast. Examination reveals erythema in the left inframammary fold. - Prescribed an antifungal cream to be applied twice daily until rash resolves, plus an additional 7 days. - Advised on the use of a barrier cream like Desitin over the antifungal cream for additional protection. - Prescription sent to [facility] on [address].

## 2024-06-05 NOTE — Assessment & Plan Note (Signed)
-   Presents with intermittent, wavelike abdominal pain for several weeks, with the current episode lasting 1-2 weeks. Associated with tachycardia, dyspnea, nausea, bloating, and early satiety. Bowel habits alternate between constipation and diarrhea. EGD in February was normal. An ultrasound of the gallbladder was ordered by GI but not completed. Examination reveals tenderness to palpation in the upper abdomen. - Re-engage with gastroenterology to reschedule gallbladder ultrasound. - Order CT of the abdomen and pelvis. Patient will be called to schedule. - Check labs including lipase, A1C, and cholesterol panel. Patient to return for blood draw.

## 2024-06-06 ENCOUNTER — Other Ambulatory Visit: Payer: Self-pay

## 2024-06-07 ENCOUNTER — Inpatient Hospital Stay: Admission: RE | Admit: 2024-06-07 | Source: Ambulatory Visit

## 2024-06-10 ENCOUNTER — Other Ambulatory Visit

## 2024-08-05 ENCOUNTER — Encounter: Payer: Self-pay | Admitting: Family Medicine

## 2024-08-05 ENCOUNTER — Ambulatory Visit (INDEPENDENT_AMBULATORY_CARE_PROVIDER_SITE_OTHER): Admitting: Family Medicine

## 2024-08-05 ENCOUNTER — Ambulatory Visit (INDEPENDENT_AMBULATORY_CARE_PROVIDER_SITE_OTHER)

## 2024-08-05 ENCOUNTER — Encounter: Payer: Self-pay | Admitting: Podiatry

## 2024-08-05 ENCOUNTER — Ambulatory Visit: Admitting: Podiatry

## 2024-08-05 VITALS — BP 117/81 | HR 92 | Ht 66.0 in | Wt 144.4 lb

## 2024-08-05 DIAGNOSIS — R1013 Epigastric pain: Secondary | ICD-10-CM | POA: Diagnosis not present

## 2024-08-05 DIAGNOSIS — G43909 Migraine, unspecified, not intractable, without status migrainosus: Secondary | ICD-10-CM | POA: Diagnosis not present

## 2024-08-05 DIAGNOSIS — M722 Plantar fascial fibromatosis: Secondary | ICD-10-CM | POA: Diagnosis not present

## 2024-08-05 DIAGNOSIS — F332 Major depressive disorder, recurrent severe without psychotic features: Secondary | ICD-10-CM

## 2024-08-05 DIAGNOSIS — S93313A Subluxation of tarsal joint of unspecified foot, initial encounter: Secondary | ICD-10-CM | POA: Diagnosis not present

## 2024-08-05 DIAGNOSIS — F32A Depression, unspecified: Secondary | ICD-10-CM

## 2024-08-05 MED ORDER — NURTEC 75 MG PO TBDP: 1.0000 | ORAL_TABLET | ORAL | 11 refills | Status: AC

## 2024-08-05 NOTE — Assessment & Plan Note (Deleted)
 History of multiple failed medication trials. Patient is experiencing significant functional impairment. - Counselled on non-pharmacologic options including electroconvulsive therapy (ECT) and transcranial magnetic stimulation (TMS). - Advised to schedule a follow-up appointment with psychiatrist (Dr. Velma) to discuss appropriateness of ECT/TMS. - Will provide support for disability application if pursued.

## 2024-08-05 NOTE — Assessment & Plan Note (Signed)
 History of multiple failed medication trials. Patient is experiencing significant functional impairment. - Counselled on non-pharmacologic options including electroconvulsive therapy (ECT) and transcranial magnetic stimulation (TMS). - Advised to schedule a follow-up appointment with psychiatrist (Dr. Velma) to discuss appropriateness of ECT/TMS. - Will provide support for disability application if pursued.

## 2024-08-05 NOTE — Progress Notes (Unsigned)
 Established Patient Office Visit  Subjective   Patient ID: Kelly Dorsey, female    DOB: 1976-01-29  Age: 48 y.o. MRN: [id number]  Chief Complaint  Patient presents with   Medical Management of Chronic Issues    HPI  Subjective - Follow-up for treatment-resistant depression. Reports no change in symptoms. Discussed non-medication options as previous medication trials have been ineffective or caused side effects. Recommended discussing ECT or TMS with psychiatrist. Has been seeing a psychiatrist, Kelly Dorsey, since last year, with a history of psychiatric care since 2002. Plans to schedule an appointment with the psychiatrist to discuss these options. - Follow-up for chronic migraines. Reports continued frequent headaches with minimal improvement on previous treatments. Has a history of trying numerous medications. [name]  was discontinued due to lack of significant improvement and was associated with the onset of neuropathy in hands and feet, which has persisted. Rizatriptan  is used frequently, approximately 9-12 times per month. Reports Imitrex caused tongue swelling and numbness. Reports family member has had success with a nasal spray for migraines. - Follow-up for chronic stomach issues. Continues to have symptoms. CT scan was denied by insurance. Awaiting appeal for the CT scan, citing prior EGD and colonoscopy results. If appeal is denied, will proceed with an ultrasound. - Reports new issue with a bone-like protrusion on the side of the foot, which is painful. Has a podiatry appointment today. - Reports job loss in 02-Apr-2024 after grandmother's passing, leading to a shutdown in activity. Considering applying for disability for either migraines or depression. - Reports neck pain, with headaches sometimes starting in the neck and radiating to the top of the head. Inquires if this could be related to cervical arthritis. Has tried stretching exercises with temporary relief.  Medications Current  medication is rizatriptan  (Maxalt ) PRN for migraines. A long list of previously tried medications for migraines includes: Rizatriptan , Depakote, propranolol, tramadol, Cymbalta, Topamax, amitriptyline, Imitrex, Tylenol , [name] , Abilify, Botox , Flexeril , Emgality, prednisone , diclofenac, gabapentin , ibuprofen , Toradol  injections, methylprednisolone , mirtazapine , Zofran , Phenergan , and Effexor .  PMH, PSH, FH, Social Hx PMHx: Treatment-resistant depression, chronic migraines, neuropathy, cervical arthritis. History of EGD and colonoscopy, results not specified. History of MRI of the neck showing some abnormalities (disc protrusion/bulging, facet arthropathy). PSH: None mentioned. FH: Mother has significant foot problems requiring multiple surgeries. Brother has had success with magnesium IV infusions for migraines. Social Hx: Unemployed since 04-02-24. Grandmother passed away in 04-02-24.  ROS Pertinent Positives: Chronic depression, frequent headaches, neuropathy in extremities, chronic stomach upset/nausea, neck pain, new painful foot lesion. Pertinent Negatives: No fever or chills. Blood work, including for tick-borne illnesses, has been unremarkable.   The 10-year ASCVD risk score (Arnett DK, et al., 2019) is: 2.2%  Health Maintenance Due  Topic Date Due   COVID-19 Vaccine (1) Never done   HIV Screening  Never done   Hepatitis C Screening  Never done   DTaP/Tdap/Td (1 - Tdap) Never done   Pneumococcal Vaccine (1 of 2 - PCV) Never done   Hepatitis B Vaccines 19-59 Average Risk (1 of 3 - 19+ 3-dose series) Never done   Cervical Cancer Screening (HPV/Pap Cotest)  Never done   Influenza Vaccine  05/24/2024      Objective:     BP 117/81   Pulse 92   Ht 5' 6 (1.676 m)   Wt 144 lb 6.4 oz (65.5 kg)   SpO2 98%   BMI 23.31 kg/m  {Vitals History (Optional):23777}  Physical Exam Gen: alert, oriented Pulm: no respiratory  distress Psych: pleasant affect   No results found for any  visits on 08/05/24.      Assessment & Plan:   Anxiety and depression  Severe episode of recurrent major depressive disorder, without psychotic features (HCC) Assessment & Plan: History of multiple failed medication trials. Patient is experiencing significant functional impairment. - Counselled on non-pharmacologic options including electroconvulsive therapy (ECT) and transcranial magnetic stimulation (TMS). - Advised to schedule a follow-up appointment with psychiatrist (Kelly Dorsey) to discuss appropriateness of ECT/TMS. - Will provide support for disability application if pursued.   Episodic migraine Assessment & Plan: History of extensive medication trials with poor efficacy or intolerable side effects. Currently overusing triptans. Neuropathy onset correlated with [name]  trial. - Start Nurtec ODT (Rimegepant) 75 mg, dispense 16, take one tablet every other day for prevention. [facility]. Counseled that this is a CGRP antagonist but not a monoclonal antibody like [name] , and side effects are typically minimal (e.g., nausea). Can be used for prevention or as an abortive treatment. Safe to take with rizatriptan . - Order for IV magnesium infusion at the infusion center to be sent. - Discussed cervical traction devices (inflatable collar, harness) as a low-cost, over-the-counter option to assess for a cervicogenic component to headaches. - Advised to follow up with an orthopedic specialist regarding neck issues and potential for steroid injections. A referral should not be needed if seen within the last year.   Epigastric pain Assessment & Plan: hronic, etiology unclear. - Will contact insurance to appeal the denial of the abdominal CT scan. - If CT appeal is denied, will order an abdominal ultrasound.   Cuboid syndrome, initial encounter Assessment & Plan: Reports painful bony prominence on foot. - Has a podiatry appointment scheduled for today for  evaluation.   Other orders -     Nurtec; Take 1 tablet (75 mg total) by mouth every other day.  Dispense: 16 tablet; Refill: 11     Return in about 3 months (around 11/05/2024) for HA, gi, mdd.    [name], MD

## 2024-08-05 NOTE — Assessment & Plan Note (Signed)
 hronic, etiology unclear. - Will contact insurance to appeal the denial of the abdominal CT scan. - If CT appeal is denied, will order an abdominal ultrasound.

## 2024-08-05 NOTE — Patient Instructions (Addendum)
 It was nice to see you today,  We addressed the following topics today: -I am sending in a prescription for Nurtec ODT.  This will be for prevention but can also be used for headache abortion as well.  Take it every other day to start.  It is a dissolvable tablet so you do not need to swallow it. - I am going to resend the information regarding your CT scan - I am going to order an infusion of IV magnesium.  Someone will call you to schedule this - Talk to your psychiatrist about scheduling appointment and then when you see them ask them about trans magnetic stimulation (TMS) or electroconvulsive therapy (ECT). - You can also schedule an appointment with your orthopedist.  You should not need a referral for me since you have seen them already. - You can also try over-the-counter nonmedication options for your neck and headaches such as cervical traction devices that I showed you.  These it can be available at [facility] or at [facility].  Have a great day,  Rolan Slain, MD

## 2024-08-05 NOTE — Assessment & Plan Note (Signed)
 Reports painful bony prominence on foot. - Has a podiatry appointment scheduled for today for evaluation.

## 2024-08-05 NOTE — Progress Notes (Signed)
 Subjective:   Patient ID: Kelly Dorsey, female   DOB: 48 y.o.   MRN: [id number]   HPI Patient presents stating she gets some pain on the outside of the right foot and both feet get tired and sore after periods of standing.  Does not remember injury and patient presents with caregiver.  Patient smokes 4 cigarettes/day and tries to be active   Review of Systems  All other systems reviewed and are negative.       Objective:  Physical Exam Vitals and nursing note reviewed.  Constitutional:      Appearance: She is well-developed.  Pulmonary:     Effort: Pulmonary effort is normal.  Musculoskeletal:        General: Normal range of motion.  Skin:    General: Skin is warm.  Neurological:     Mental Status: She is alert.     Neurovascular status intact muscle strength found to be adequate range of motion adequate with the patient noted to have mild discomfort both feet but localized with no severe pain noted and has a small bone spur dorsal lateral side right foot but also may be part of anatomy with minimal current pain noted.  Also has discomfort in the plantar heels at times     Assessment:  Inflammatory fasciitis moderate flatfoot deformity with prominence of the bone structure right     Plan:  H&P reviewed all conditions discussed shoe gear modification stretching exercises and do not recommend surgery currently.  Patient will be seen back to recheck as symptoms indicate  X-rays indicate bone structure looks excellent joints are wide open with good alignment noted

## 2024-08-05 NOTE — Assessment & Plan Note (Signed)
 History of extensive medication trials with poor efficacy or intolerable side effects. Currently overusing triptans. Neuropathy onset correlated with [name]  trial. - Start Nurtec ODT (Rimegepant) 75 mg, dispense 16, take one tablet every other day for prevention. [facility]. Counseled that this is a CGRP antagonist but not a monoclonal antibody like [name] , and side effects are typically minimal (e.g., nausea). Can be used for prevention or as an abortive treatment. Safe to take with rizatriptan . - Order for IV magnesium infusion at the infusion center to be sent. - Discussed cervical traction devices (inflatable collar, harness) as a low-cost, over-the-counter option to assess for a cervicogenic component to headaches. - Advised to follow up with an orthopedic specialist regarding neck issues and potential for steroid injections. A referral should not be needed if seen within the last year.

## 2024-08-19 ENCOUNTER — Other Ambulatory Visit: Payer: Self-pay | Admitting: Family Medicine

## 2024-08-19 MED ORDER — MAGNESIUM SULFATE 4 GM/100ML IV SOLN: 2.0000 g | Freq: Once | INTRAVENOUS | 0 refills | Status: AC

## 2024-08-26 ENCOUNTER — Telehealth: Payer: Self-pay | Admitting: *Deleted

## 2024-08-26 NOTE — Telephone Encounter (Signed)
 LVM at [facility] infusion center to see about getting the requested infusion set up.

## 2024-09-01 ENCOUNTER — Other Ambulatory Visit: Payer: Self-pay | Admitting: Family Medicine

## 2024-09-02 ENCOUNTER — Other Ambulatory Visit (HOSPITAL_COMMUNITY): Payer: Self-pay | Admitting: Family Medicine

## 2024-09-02 DIAGNOSIS — G43909 Migraine, unspecified, not intractable, without status migrainosus: Secondary | ICD-10-CM | POA: Insufficient documentation

## 2024-09-06 ENCOUNTER — Ambulatory Visit (HOSPITAL_COMMUNITY)
Admission: RE | Admit: 2024-09-06 | Discharge: 2024-09-06 | Disposition: A | Discharge status: Home / Self Care | Source: Ambulatory Visit | Attending: Family Medicine | Admitting: Family Medicine

## 2024-09-06 VITALS — BP 129/86 | HR 67 | Temp 98.1°F | Resp 16

## 2024-09-06 DIAGNOSIS — G43909 Migraine, unspecified, not intractable, without status migrainosus: Secondary | ICD-10-CM | POA: Insufficient documentation

## 2024-09-06 MED ORDER — MAGNESIUM SULFATE 2 GM/50ML IV SOLN
2.0000 g | Freq: Once | INTRAVENOUS | Status: AC
  Administered 2024-09-06: 2 g via INTRAVENOUS
  Filled 2024-09-06: qty 50

## 2024-10-01 ENCOUNTER — Encounter: Payer: Self-pay | Admitting: Family Medicine

## 2024-10-02 ENCOUNTER — Other Ambulatory Visit: Payer: Self-pay | Admitting: Family Medicine

## 2024-10-02 MED ORDER — AMPHETAMINE-DEXTROAMPHETAMINE 20 MG PO TABS: 20.0000 mg | ORAL_TABLET | Freq: Every day | ORAL | 0 refills | Status: DC

## 2024-10-28 ENCOUNTER — Telehealth: Payer: Self-pay

## 2024-10-28 NOTE — Telephone Encounter (Signed)
 Pharmacy Patient Advocate Encounter   Received notification from Onbase that prior authorization for Rimegepant Sulfate (NURTEC) 75 MG TBDP  is required/requested.   Insurance verification completed.   The patient is insured through [facility] MEDICAID.   Per test claim: PA required; PA submitted to above mentioned insurance via Latent Key/confirmation #/EOC [id number]  Status is pending

## 2024-10-29 ENCOUNTER — Other Ambulatory Visit (HOSPITAL_COMMUNITY): Payer: Self-pay

## 2024-10-29 NOTE — Telephone Encounter (Signed)
 Pharmacy Patient Advocate Encounter  Received notification from [facility] MEDICAID that Prior Authorization for Nurtec 75MG  dispersible tablets  has been APPROVED Ran test claim, Copay is $4.00. This test claim was processed through [facility]- copay amounts may vary at other pharmacies due to pharmacy/plan contracts, or as the patient moves through the different stages of their insurance plan. SEE TEST CLAIM BELOW   PA #/Case ID/Reference #: NA

## 2024-10-30 ENCOUNTER — Other Ambulatory Visit: Payer: Self-pay | Admitting: Medical Genetics

## 2024-11-01 ENCOUNTER — Encounter: Payer: Self-pay | Admitting: Family Medicine

## 2024-11-04 ENCOUNTER — Other Ambulatory Visit: Payer: Self-pay | Admitting: Family Medicine

## 2024-11-04 MED ORDER — AMPHETAMINE-DEXTROAMPHETAMINE 20 MG PO TABS: 20.0000 mg | ORAL_TABLET | Freq: Every day | ORAL | 0 refills | Status: AC

## 2024-11-05 ENCOUNTER — Ambulatory Visit (INDEPENDENT_AMBULATORY_CARE_PROVIDER_SITE_OTHER): Admitting: Family Medicine

## 2024-11-05 ENCOUNTER — Encounter: Payer: Self-pay | Admitting: Family Medicine

## 2024-11-05 VITALS — BP 133/83 | HR 119 | Ht 66.0 in | Wt 143.8 lb

## 2024-11-05 DIAGNOSIS — F332 Major depressive disorder, recurrent severe without psychotic features: Secondary | ICD-10-CM

## 2024-11-05 DIAGNOSIS — G43909 Migraine, unspecified, not intractable, without status migrainosus: Secondary | ICD-10-CM | POA: Diagnosis not present

## 2024-11-05 DIAGNOSIS — G2581 Restless legs syndrome: Secondary | ICD-10-CM | POA: Diagnosis not present

## 2024-11-05 MED ORDER — SUMATRIPTAN 20 MG/ACT NA SOLN: 20.0000 mg | NASAL | 3 refills | Status: AC | PRN

## 2024-11-05 NOTE — Assessment & Plan Note (Signed)
 frequent migraines with prolonged duration and increased fatigue post-rizatriptan . Nurtec ineffective (states it made the migraines last longer). Did not tolerate ajovy  previously. Considering alternative triptans due to side effects and insurance coverage. - Prescribed sumatriptan  nasal spray as an alternative to rizatriptan . - Instructed to avoid taking rizatriptan  and sumatriptan  within 24 hours of each other. - Educated on the use of sumatriptan  nasal spray and its potential benefits.

## 2024-11-05 NOTE — Assessment & Plan Note (Addendum)
 Persistent symptoms despite multiple medication trials.  Most recently pt did not tolerate venlafaxine  and is no longer taking it. Considering TMS as a non-pharmacological option. - Provided information on TMS and its potential benefits. - Offered referral to [facility] for TMS evaluation.

## 2024-11-05 NOTE — Assessment & Plan Note (Signed)
 Symptoms of leg discomfort and static electricity sensation. Previous IV magnesium  provided temporary relief. Oral magnesium  oxide caused gastrointestinal upset. Considering alternative magnesium  formulations for better tolerance. - Recommended trying SlowMag or magnesium  gluconate for better tolerance. - Advised taking magnesium  at night, 30 minutes before bed, to help with leg symptoms. - Discussed the option of magnesium  infusions if oral formulations are not effective.

## 2024-11-05 NOTE — Progress Notes (Signed)
 "    Established Patient Office Visit  Subjective   Patient ID: Kelly Dorsey, female    DOB: January 12, 1976  Age: 49 y.o. MRN: [id number]  Chief Complaint  Patient presents with   Medical Management of Chronic Issues     History of Present Illness   Kelly Dorsey is a 49 year old female with migraines and anxiety who presents with worsening symptoms and medication side effects.  She reports worsening migraines despite multiple medications. Nurtec was eventually approved by insurance but after three doses seemed to prolong her migraines, so she returned to rizatriptan . Rizatriptan  improves pain but causes marked fatigue and heaviness in her arms and body, to the point she struggles with basic tasks such as walking to the bathroom or washing dishes. Symptoms are worse when she needs repeat doses. She previously used [name]  but stopped due to side effects.  She had IV magnesium  for headaches, which gave about three days of relief and improved leg relaxation. Oral magnesium  oxide caused significant GI upset, so she is interested in trying different oral magnesium  formulations that might be better tolerated.  She has restless leg syndrome and previously took ropinirole . IV magnesium  briefly improved her leg symptoms, but the benefit was short-lived.  She has anxiety, PTSD, and likely major depressive disorder. Prior psychiatric medications caused nightmares and night terrors, so she is looking for other mental health treatment options.  She is not working due to her symptoms and is considering applying for disability. She is having financial difficulties, including missed mortgage payments, and is familiar with the Social Security process from managing survivor benefits for her children after her husband's death.          The 10-year ASCVD risk score (Arnett DK, et al., 2019) is: 2.8%  Health Maintenance Due  Topic Date Due   HIV Screening  Never done   Hepatitis C Screening  Never  done   Pneumococcal Vaccine (1 of 2 - PCV) Never done   Hepatitis B Vaccines 19-59 Average Risk (1 of 3 - 19+ 3-dose series) Never done   Cervical Cancer Screening (HPV/Pap Cotest)  Never done   COVID-19 Vaccine (3 - Pfizer risk series) 07/15/2020   DTaP/Tdap/Td (2 - Td or Tdap) 06/02/2024   Mammogram  12/05/2024      Objective:     BP 133/83   Pulse (!) 119   Ht 5' 6 (1.676 m)   Wt 143 lb 12.8 oz (65.2 kg)   SpO2 99%   BMI 23.21 kg/m    Physical Exam     Gen: alert, oriented Pulm: no respiratory distress Psych: pleasant affect       No results found for any visits on 11/05/24.      Assessment & Plan:   Episodic migraine Assessment & Plan: frequent migraines with prolonged duration and increased fatigue post-rizatriptan . Nurtec ineffective (states it made the migraines last longer). Did not tolerate ajovy  previously. Considering alternative triptans due to side effects and insurance coverage. - Prescribed sumatriptan  nasal spray as an alternative to rizatriptan . - Instructed to avoid taking rizatriptan  and sumatriptan  within 24 hours of each other. - Educated on the use of sumatriptan  nasal spray and its potential benefits.   RLS (restless legs syndrome) Assessment & Plan: Symptoms of leg discomfort and static electricity sensation. Previous IV magnesium  provided temporary relief. Oral magnesium  oxide caused gastrointestinal upset. Considering alternative magnesium  formulations for better tolerance. - Recommended trying SlowMag or magnesium  gluconate for better tolerance. - Advised  taking magnesium  at night, 30 minutes before bed, to help with leg symptoms. - Discussed the option of magnesium  infusions if oral formulations are not effective.   Severe episode of recurrent major depressive disorder, without psychotic features (HCC) Assessment & Plan: Persistent symptoms despite multiple medication trials.  Most recently pt did not tolerate venlafaxine  and is no  longer taking it. Considering TMS as a non-pharmacological option. - Provided information on TMS and its potential benefits. - Offered referral to [facility] for TMS evaluation.   Other orders -     SUMAtriptan ; Place 1 spray (20 mg total) into the nose every 2 (two) hours as needed for migraine or headache. May repeat in 2 hours if headache persists or recurs. No more than 2 sprays in 24 hr period  Dispense: 6 each; Refill: 3       Return in about 6 weeks (around 12/17/2024).    [name], MD  "

## 2024-11-05 NOTE — Patient Instructions (Addendum)
" °  VISIT SUMMARY: During your visit, we discussed your worsening migraines, restless leg syndrome, anxiety, PTSD, and depression. We reviewed your current medications and their side effects, and we explored alternative treatments and management options.  YOUR PLAN: CHRONIC MIGRAINE: Your migraines have been worsening, and current medications are causing significant side effects. -Prescribed sumatriptan  nasal spray as an alternative to rizatriptan . -Avoid taking rizatriptan  and sumatriptan  within 24 hours of each other. -Educated on the use of sumatriptan  nasal spray and its potential benefits.  RESTLESS LEGS SYNDROME: You have leg discomfort and a sensation of static electricity. Previous treatments provided only temporary relief. -Recommended trying SlowMag (magnesium  chloride) or magnesium  gluconate 500 mg for better tolerance. -Take magnesium  at night, 30 minutes before bed, to help with leg symptoms. -Discussed the option of magnesium  infusions if oral formulations are not effective.  MAJOR DEPRESSIVE DISORDER: You have persistent symptoms despite multiple medication trials and concerns about side effects. -Provided information on TMS (Transcranial Magnetic Stimulation) and its potential benefits. -Offered referral to [facility] for TMS evaluation.         "

## 2024-12-24 ENCOUNTER — Ambulatory Visit: Admitting: Family Medicine
